# Patient Record
Sex: Female | Born: 1955 | Race: White | Hispanic: No | Marital: Married | Smoking: Never smoker
Health system: Southern US, Community
[De-identification: ages and names within clinical notes are randomized; demographics above are authoritative.]

## PROBLEM LIST (undated history)

## (undated) DIAGNOSIS — R51 Headache: Secondary | ICD-10-CM

## (undated) DIAGNOSIS — D649 Anemia, unspecified: Secondary | ICD-10-CM

## (undated) DIAGNOSIS — C801 Malignant (primary) neoplasm, unspecified: Secondary | ICD-10-CM

## (undated) DIAGNOSIS — T8859XA Other complications of anesthesia, initial encounter: Secondary | ICD-10-CM

## (undated) DIAGNOSIS — K8050 Calculus of bile duct without cholangitis or cholecystitis without obstruction: Secondary | ICD-10-CM

## (undated) DIAGNOSIS — R160 Hepatomegaly, not elsewhere classified: Secondary | ICD-10-CM

## (undated) DIAGNOSIS — K219 Gastro-esophageal reflux disease without esophagitis: Secondary | ICD-10-CM

## (undated) DIAGNOSIS — T4145XA Adverse effect of unspecified anesthetic, initial encounter: Secondary | ICD-10-CM

## (undated) DIAGNOSIS — M1990 Unspecified osteoarthritis, unspecified site: Secondary | ICD-10-CM

## (undated) DIAGNOSIS — N200 Calculus of kidney: Secondary | ICD-10-CM

## (undated) DIAGNOSIS — E669 Obesity, unspecified: Secondary | ICD-10-CM

## (undated) DIAGNOSIS — K8309 Other cholangitis: Secondary | ICD-10-CM

## (undated) DIAGNOSIS — G8929 Other chronic pain: Secondary | ICD-10-CM

## (undated) HISTORY — DX: Other cholangitis: K83.09

## (undated) HISTORY — DX: Unspecified osteoarthritis, unspecified site: M19.90

## (undated) HISTORY — DX: Hepatomegaly, not elsewhere classified: R16.0

## (undated) HISTORY — DX: Calculus of kidney: N20.0

## (undated) HISTORY — DX: Malignant (primary) neoplasm, unspecified: C80.1

## (undated) HISTORY — DX: Gastro-esophageal reflux disease without esophagitis: K21.9

## (undated) HISTORY — DX: Calculus of bile duct without cholangitis or cholecystitis without obstruction: K80.50

## (undated) HISTORY — DX: Other chronic pain: G89.29

## (undated) HISTORY — DX: Obesity, unspecified: E66.9

## (undated) HISTORY — DX: Anemia, unspecified: D64.9

## (undated) HISTORY — DX: Headache: R51

---

## 1975-03-14 HISTORY — PX: CHOLECYSTECTOMY OPEN: SUR202

## 2002-03-13 HISTORY — PX: KNEE ARTHROSCOPY: SUR90

## 2005-03-13 HISTORY — PX: ACOUSTIC NEUROMA RESECTION: SHX5713

## 2008-11-20 ENCOUNTER — Ambulatory Visit: Payer: Self-pay | Admitting: Internal Medicine

## 2008-11-24 LAB — COMPREHENSIVE METABOLIC PANEL
ALT: 23 U/L (ref 0–35)
AST: 22 U/L (ref 0–37)
Albumin: 4.4 g/dL (ref 3.5–5.2)
Alkaline Phosphatase: 92 U/L (ref 39–117)
BUN: 15 mg/dL (ref 6–23)
CO2: 26 mEq/L (ref 19–32)
Calcium: 9.6 mg/dL (ref 8.4–10.5)
Glucose, Bld: 94 mg/dL (ref 70–99)
Potassium: 4.1 mEq/L (ref 3.5–5.3)
Total Bilirubin: 1.1 mg/dL (ref 0.3–1.2)

## 2008-11-24 LAB — CBC WITH DIFFERENTIAL/PLATELET
BASO%: 0.5 % (ref 0.0–2.0)
Basophils Absolute: 0 10*3/uL (ref 0.0–0.1)
Eosinophils Absolute: 0.2 10*3/uL (ref 0.0–0.5)
LYMPH%: 41.7 % (ref 14.0–49.7)
MCHC: 33.7 g/dL (ref 31.5–36.0)
MCV: 87.7 fL (ref 79.5–101.0)
MONO#: 0.4 10*3/uL (ref 0.1–0.9)
MONO%: 10.7 % (ref 0.0–14.0)
NEUT#: 1.8 10*3/uL (ref 1.5–6.5)
RBC: 4.63 10*6/uL (ref 3.70–5.45)
WBC: 4.2 10*3/uL (ref 3.9–10.3)
lymph#: 1.7 10*3/uL (ref 0.9–3.3)

## 2008-12-03 ENCOUNTER — Encounter: Admission: RE | Admit: 2008-12-03 | Discharge: 2008-12-03 | Payer: Self-pay | Admitting: Family Medicine

## 2008-12-28 ENCOUNTER — Encounter: Admission: RE | Admit: 2008-12-28 | Discharge: 2008-12-28 | Payer: Self-pay | Admitting: Otolaryngology

## 2009-03-13 HISTORY — PX: ROUX-EN-Y GASTRIC BYPASS: SHX1104

## 2010-11-17 ENCOUNTER — Emergency Department (HOSPITAL_COMMUNITY): Payer: BC Managed Care – PPO

## 2010-11-17 ENCOUNTER — Inpatient Hospital Stay (HOSPITAL_COMMUNITY)
Admission: EM | Admit: 2010-11-17 | Discharge: 2010-11-21 | DRG: 416 | Disposition: A | Discharge status: Home / Self Care | Payer: BC Managed Care – PPO | Attending: Internal Medicine | Admitting: Internal Medicine

## 2010-11-17 ENCOUNTER — Encounter (HOSPITAL_COMMUNITY): Payer: Self-pay

## 2010-11-17 ENCOUNTER — Emergency Department (HOSPITAL_COMMUNITY)
Admit: 2010-11-17 | Discharge: 2010-11-17 | Disposition: A | Discharge status: Home / Self Care | Payer: BC Managed Care – PPO | Attending: Gastroenterology | Admitting: Gastroenterology

## 2010-11-17 DIAGNOSIS — R109 Unspecified abdominal pain: Secondary | ICD-10-CM

## 2010-11-17 DIAGNOSIS — R509 Fever, unspecified: Secondary | ICD-10-CM | POA: Diagnosis present

## 2010-11-17 DIAGNOSIS — K8050 Calculus of bile duct without cholangitis or cholecystitis without obstruction: Secondary | ICD-10-CM | POA: Diagnosis present

## 2010-11-17 DIAGNOSIS — A409 Streptococcal sepsis, unspecified: Principal | ICD-10-CM | POA: Diagnosis present

## 2010-11-17 DIAGNOSIS — I495 Sick sinus syndrome: Secondary | ICD-10-CM | POA: Diagnosis present

## 2010-11-17 DIAGNOSIS — A4151 Sepsis due to Escherichia coli [E. coli]: Secondary | ICD-10-CM | POA: Diagnosis present

## 2010-11-17 DIAGNOSIS — A419 Sepsis, unspecified organism: Secondary | ICD-10-CM | POA: Diagnosis present

## 2010-11-17 DIAGNOSIS — Z9884 Bariatric surgery status: Secondary | ICD-10-CM

## 2010-11-17 DIAGNOSIS — K831 Obstruction of bile duct: Secondary | ICD-10-CM

## 2010-11-17 DIAGNOSIS — K8309 Other cholangitis: Secondary | ICD-10-CM | POA: Diagnosis present

## 2010-11-17 LAB — COMPREHENSIVE METABOLIC PANEL
ALT: 162 U/L — ABNORMAL HIGH (ref 0–35)
Alkaline Phosphatase: 409 U/L — ABNORMAL HIGH (ref 39–117)
CO2: 24 mEq/L (ref 19–32)
Chloride: 99 mEq/L (ref 96–112)
Creatinine, Ser: 0.47 mg/dL — ABNORMAL LOW (ref 0.50–1.10)
GFR calc Af Amer: >60 mL/min (ref >60)
Glucose, Bld: 140 mg/dL — ABNORMAL HIGH (ref 70–99)
Potassium: 3.8 mEq/L (ref 3.5–5.1)

## 2010-11-17 LAB — APTT: aPTT: 32 seconds (ref 24–37)

## 2010-11-17 LAB — DIFFERENTIAL
Lymphocytes Relative: 2 % — ABNORMAL LOW (ref 12–46)
Monocytes Relative: 1 % — ABNORMAL LOW (ref 3–12)
Neutrophils Relative %: 97 % — ABNORMAL HIGH (ref 43–77)

## 2010-11-17 LAB — URINE MICROSCOPIC-ADD ON

## 2010-11-17 LAB — URINALYSIS, ROUTINE W REFLEX MICROSCOPIC
Nitrite: POSITIVE — AB
Protein, ur: NEGATIVE mg/dL

## 2010-11-17 LAB — PROTIME-INR: Prothrombin Time: 13.1 seconds (ref 11.6–15.2)

## 2010-11-17 LAB — CBC
HCT: 42.4 % (ref 36.0–46.0)
MCHC: 31.8 g/dL (ref 30.0–36.0)
MCV: 92.8 fL (ref 78.0–100.0)
WBC: 16.4 10*3/uL — ABNORMAL HIGH (ref 4.0–10.5)

## 2010-11-17 LAB — MRSA PCR SCREENING: MRSA by PCR: NEGATIVE

## 2010-11-17 LAB — ACETAMINOPHEN LEVEL: Acetaminophen (Tylenol), Serum: <15 ug/mL (ref 10–30)

## 2010-11-17 LAB — LACTIC ACID, PLASMA: Lactic Acid, Venous: 3.5 mmol/L — ABNORMAL HIGH (ref 0.5–2.2)

## 2010-11-17 LAB — PROCALCITONIN: Procalcitonin: 2.06 ng/mL

## 2010-11-17 LAB — LIPASE, BLOOD: Lipase: 11 U/L (ref 11–59)

## 2010-11-17 MED ORDER — IOHEXOL 300 MG/ML  SOLN
100.0000 mL | Freq: Once | INTRAMUSCULAR | Status: AC | PRN
  Administered 2010-11-17: 100 mL via INTRAVENOUS

## 2010-11-17 NOTE — H&P (Signed)
NAME:  Theresa Morrow, Theresa Morrow NO.:  [id number]  MEDICAL RECORD NO.:  [id number]  LOCATION:  WLED                         FACILITY:  [facility]  PHYSICIAN:  Brendia Sacks, MD    DATE OF BIRTH:  06/23/1955  DATE OF ADMISSION:  11/17/2010 DATE OF DISCHARGE:                             HISTORY & PHYSICAL   REFERRING PHYSICIAN:  Abbe Amsterdam in the emergency room.  PRIMARY CARE PHYSICIAN:  Marjory Lies, MD  CHIEF COMPLAINT:  Abdominal pain.  HISTORY OF PRESENT ILLNESS:  This is a 55 year old who presents to the emergency room with acute abdominal pain.  The patient reports that about a week ago she had flu-like symptoms and fever up to 103, this resolved.  Then yesterday afternoon, she developed abdominal pain, felt quite weak and hardly had the energy to drive home.  Last night, she had dry heaves, no diarrhea but has had persistent right upper quadrant pain in max intensity of 7/10.  She feels like she has been "kicked."  Pain is now better with pain medication.  REVIEW OF SYSTEMS:  Positive for fever, negative for changes to her vision, sore throat, rash, muscle aches, chest pain, dysuria or bleeding.  She had some shortness of breath secondary to pain but that is now better.  PAST MEDICAL HISTORY:  Chronic knee pain.  PAST SURGICAL HISTORY: 1. Left arthroscopic knee surgery. 2. Cholecystectomy about 25 years ago. 3. Gastric bypass about 2 years ago at [facility] in     [place]. 4. Acoustic neuroma resection.  ALLERGIES:  None.  SOCIAL HISTORY:  Nonsmoker, nondrinker.  She lives in [place] with her husband.  FAMILY HISTORY:  Her mother had heart disease and gallbladder problems have been noted in her family.  MEDICATIONS: 1. Percocet 10/325 p.o. q.i.d. as needed for pain.  She takes this as     an alternative to Vicodin as needed. 2. Cyanocobalamin p.o. daily. 3. Artificial tears each eye 1 drop t.i.d. 4. Excedrin Migraine every 8 hours as  needed. 5. Motrin 200 mg every 4 hours as needed. 6. Vicodin ES 7.5/750 one tablet every 4 hours as needed for pain.  PHYSICAL EXAMINATION:  VITAL SIGNS:  Temperature up to 103.5.  Initial blood pressure 158/94, most recent blood pressure recorded 127/70 with a pulse of 84, respirations of 22 and temperature 99.6.  Systolic blood pressure in the 90s during my examination. GENERAL:  Well-developed, well-nourished woman who appears to be acutely ill but is nontoxic in appearance. HEAD:  Appears to be normal. EYES:  Sclerae are clear.  Pupils are equal, round, reactive to light with irises and conjunctiva appearing unremarkable. EARS:  Hearing grossly intact. MOUTH:  Lips, gums and tongue appear unremarkable. NECK:  Supple.  No lymphadenopathy or masses.  No thyromegaly. CHEST:  Clear to auscultation bilaterally.  No wheezes, rales or rhonchi.  There is normal respiratory effort. CARDIOVASCULAR:  Regular rate and rhythm.  No murmurs, rubs or gallop. There is 2+ bilateral lower extremity edema which she reports as chronic. ABDOMEN:  Soft with positive right upper quadrant pain.  It is nondistended. SKIN:  Normal without rash or indurations.  It is nontender  to palpation. MUSCULOSKELETAL:  Appears to be grossly normal. PSYCHIATRIC:  Grossly normal mood and affect.  Speech is fluent and appropriate.  She is a good historian.  IMAGING:  CT of the abdomen and pelvis reveals dilated bile ducts with multiple tiny stones in the distal common bile duct.  Hepatomegaly is noted.  PERTINENT LABORATORY STUDIES: 1. Venous lactic acid is 3.5. 2. Pro calcitonin is 2.06. 3. Lipase within normal limits. 4. CBC notable for white blood cell count 16.4.  Basic metabolic panel     unremarkable.  Hepatic function panel notable for a total bilirubin     of 5.1, alkaline phosphatase of 409, AST of 162 and ALT of 162.     Urinalysis equivocal.  Urine culture pending.  ASSESSMENT AND PLAN:  This is a  55 year old woman who presents with abdominal pain, nausea, vomiting and fever. 1. Cholangitis, choledolithiasis with sepsis.  The patient was     initially hypertensive and is now hypotensive.  She is nontoxic in     appearance, but she will need aggressive volume resuscitation and     antibiotics.  She has already received Zosyn and will continue this     at this time.  Gastroenterology has seen her in consultation     already in the emergency room.  She is not felt to be a candidate     for endoscopic retrograde cholangiopancreatography secondary to her     anatomy.  They recommended General Surgery consult which I have     placed at this time.  Interventional Radiology also already     consulted and has evaluated the patient and plans of placing a     drain in the next 30 minutes. 2. Chronic knee pain.  Morphine as needed.  Initially, the patient's     blood pressure was stable, but now she has become hypotensive and     her lactic acid is elevated concern for developing sepsis and we     will upgrade her status.  Discussed plan with her husband at     bedside.     Brendia Sacks, MD     DG/MEDQ  D:  11/17/2010  T:  11/17/2010  Job:  [id number]  cc:   Marjory Lies, M.D.  Electronically Signed by Brendia Sacks  on 11/17/2010 10:28:59 PM

## 2010-11-18 LAB — URINE CULTURE
Colony Count: NO GROWTH
Culture  Setup Time: 201209061148
Culture: NO GROWTH

## 2010-11-18 LAB — DIFFERENTIAL
Basophils Relative: 0 % (ref 0–1)
Eosinophils Absolute: 0 10*3/uL (ref 0.0–0.7)
Lymphocytes Relative: 6 % — ABNORMAL LOW (ref 12–46)
Lymphs Abs: 1.1 10*3/uL (ref 0.7–4.0)
Monocytes Absolute: 1.3 10*3/uL — ABNORMAL HIGH (ref 0.1–1.0)
Monocytes Relative: 7 % (ref 3–12)
Neutro Abs: 14.5 10*3/uL — ABNORMAL HIGH (ref 1.7–7.7)
Neutrophils Relative %: 86 % — ABNORMAL HIGH (ref 43–77)

## 2010-11-18 LAB — COMPREHENSIVE METABOLIC PANEL
Albumin: 2.4 g/dL — ABNORMAL LOW (ref 3.5–5.2)
Alkaline Phosphatase: 239 U/L — ABNORMAL HIGH (ref 39–117)
BUN: 17 mg/dL (ref 6–23)
Calcium: 8.3 mg/dL — ABNORMAL LOW (ref 8.4–10.5)
Creatinine, Ser: 0.62 mg/dL (ref 0.50–1.10)
Potassium: 3.8 mEq/L (ref 3.5–5.1)
Total Protein: 5.6 g/dL — ABNORMAL LOW (ref 6.0–8.3)

## 2010-11-18 LAB — HEPATITIS PANEL, ACUTE
HCV Ab: NEGATIVE
Hep A IgM: NEGATIVE

## 2010-11-18 LAB — CBC
HCT: 35.2 % — ABNORMAL LOW (ref 36.0–46.0)
MCH: 29.4 pg (ref 26.0–34.0)
MCV: 93.1 fL (ref 78.0–100.0)
WBC: 16.8 10*3/uL — ABNORMAL HIGH (ref 4.0–10.5)

## 2010-11-18 LAB — LIPASE, BLOOD: Lipase: 7 U/L — ABNORMAL LOW (ref 11–59)

## 2010-11-19 LAB — DIFFERENTIAL
Basophils Absolute: 0 10*3/uL (ref 0.0–0.1)
Basophils Relative: 0 % (ref 0–1)
Eosinophils Relative: 1 % (ref 0–5)
Monocytes Absolute: 0.4 10*3/uL (ref 0.1–1.0)
Monocytes Relative: 6 % (ref 3–12)

## 2010-11-19 LAB — COMPREHENSIVE METABOLIC PANEL
Albumin: 2.3 g/dL — ABNORMAL LOW (ref 3.5–5.2)
Alkaline Phosphatase: 198 U/L — ABNORMAL HIGH (ref 39–117)
BUN: 13 mg/dL (ref 6–23)
Calcium: 8.8 mg/dL (ref 8.4–10.5)
Chloride: 110 mEq/L (ref 96–112)
GFR calc Af Amer: >60 mL/min (ref >60)
Glucose, Bld: 83 mg/dL (ref 70–99)
Potassium: 3.5 mEq/L (ref 3.5–5.1)
Total Bilirubin: 1.3 mg/dL — ABNORMAL HIGH (ref 0.3–1.2)
Total Protein: 5.3 g/dL — ABNORMAL LOW (ref 6.0–8.3)

## 2010-11-19 LAB — CBC
HCT: 33.9 % — ABNORMAL LOW (ref 36.0–46.0)
MCH: 29.6 pg (ref 26.0–34.0)
MCHC: 31.9 g/dL (ref 30.0–36.0)
MCV: 92.9 fL (ref 78.0–100.0)
RDW: 14.7 % (ref 11.5–15.5)

## 2010-11-20 LAB — COMPREHENSIVE METABOLIC PANEL
Albumin: 2.1 g/dL — ABNORMAL LOW (ref 3.5–5.2)
Alkaline Phosphatase: 164 U/L — ABNORMAL HIGH (ref 39–117)
BUN: 8 mg/dL (ref 6–23)
CO2: 24 mEq/L (ref 19–32)
Chloride: 113 mEq/L — ABNORMAL HIGH (ref 96–112)
Creatinine, Ser: 0.67 mg/dL (ref 0.50–1.10)
GFR calc Af Amer: >60 mL/min (ref >60)
GFR calc non Af Amer: >60 mL/min (ref >60)
Glucose, Bld: 88 mg/dL (ref 70–99)
Potassium: 3.7 mEq/L (ref 3.5–5.1)
Total Bilirubin: 1 mg/dL (ref 0.3–1.2)

## 2010-11-20 LAB — CBC
HCT: 31.8 % — ABNORMAL LOW (ref 36.0–46.0)
Hemoglobin: 10.3 g/dL — ABNORMAL LOW (ref 12.0–15.0)
MCV: 92.7 fL (ref 78.0–100.0)
RBC: 3.43 MIL/uL — ABNORMAL LOW (ref 3.87–5.11)
RDW: 14.5 % (ref 11.5–15.5)
WBC: 2.9 10*3/uL — ABNORMAL LOW (ref 4.0–10.5)

## 2010-11-21 LAB — COMPREHENSIVE METABOLIC PANEL
ALT: 34 U/L (ref 0–35)
Albumin: 2.1 g/dL — ABNORMAL LOW (ref 3.5–5.2)
Alkaline Phosphatase: 150 U/L — ABNORMAL HIGH (ref 39–117)
Calcium: 8.2 mg/dL — ABNORMAL LOW (ref 8.4–10.5)
GFR calc Af Amer: >60 mL/min (ref >60)
Glucose, Bld: 85 mg/dL (ref 70–99)
Potassium: 3.4 mEq/L — ABNORMAL LOW (ref 3.5–5.1)
Sodium: 143 mEq/L (ref 135–145)
Total Protein: 4.8 g/dL — ABNORMAL LOW (ref 6.0–8.3)

## 2010-11-22 LAB — BODY FLUID CULTURE: Gram Stain: NONE SEEN

## 2010-11-22 NOTE — Discharge Summary (Signed)
NAMEMarland Kitchen  Theresa Morrow, Theresa Morrow NO.:  [id number]  MEDICAL RECORD NO.:  [id number]  LOCATION:  1505                         FACILITY:  [facility]  PHYSICIAN:  Brendia Sacks, MD    DATE OF BIRTH:  1955-07-10  DATE OF ADMISSION:  11/17/2010 DATE OF DISCHARGE:  11/21/2010                              DISCHARGE SUMMARY   PRIMARY CARE PHYSICIAN:  Marjory Lies, M.D.  PRIMARY SURGEON:  Abigail Miyamoto, M.D.  CONDITION ON DISCHARGE:  Improved.  DISPOSITION:  Home.  DISCHARGE DIAGNOSES: 1. Sepsis secondary to cholangitis and choledocholithiasis. 2. Chronic asymptomatic sinus bradycardia.  HISTORY OF PRESENT ILLNESS:  This is a 55 year old woman who presented to the emergency room with acute abdominal pain and was found to have cholangitis, sepsis, and choledocholithiasis.  HOSPITAL COURSE:  Theresa Morrow was admitted initially to the ICU with sepsis.  Her condition rapidly improved, status post percutaneous drain placement and with antibiotic therapy.  She remains afebrile and hemodynamically stable.  She has no nausea, vomiting, and no pain.  She has been followed both by General Surgery and Interventional Radiology. At this point, her drain has been capped and she is felt to be stable for discharge.  The recommendation was to leave the drain capped and a follow up with Interventional Radiology in 2 weeks for repeat cholangiogram.  She also follow up with Dr. Magnus Ivan in approximately 2 weeks.  At that time, consideration to ursodiol could be given.  Her culture reports are available today.  I discussed with Dr. Daiva Eves who recommends therapy with Bactrim double strength and Augmentin.  Given her clinical improvement, he feels that oral therapy is reasonable in this patient and recommends a total 14 day of therapy.  CONSULTATIONS: 1. Gastroenterology, they recommended surgical evaluation,     interventional evaluation given, the patient's Roux-en-Y she was     not a  candidate for ERCP. 2. General Surgery.  At this point, they recommend no surgery, but     follow her up in the clinic in approximately 2 weeks and will give     thought to ursodiol in the outpatient setting. 3. Interventional Radiology for percutaneous drain placement.  PROCEDURES:  Cholangiogram and placement of a biliary drain.  IMAGING: 1. CT of the abdomen and pelvis, September 6th:  Dilated bile ducts     with what appear to be multiple tiny stones in the distal common     bile duct.  Hepatomegaly.  MICROBIOLOGY: 1. Urine cultures, September 6th, no growth, final. 2. Blood cultures x2, no growth to date from September 6th. 3. Bile fluid culture, September 6th:  Rare enterococcus, sensitive to     vancomycin, ampicillin, and gentamicin, and rare Escherichia coli,     sensitive to Bactrim among other antibiotics.  PERTINENT LABORATORY STUDIES: 1. White blood cell count was 16.4 on admission and 2.99 discharge. 2. Basic metabolic panel unremarkable. 3. Total bilirubin 5.1 on admission, 0.9 on discharge.  Alkaline     phosphatase 409 on admission, 150 on discharge.  AST 162 on     admission and 12 on discharge.  ALT 162 on admission and 34 on     discharge.  4. Lactic acid was known to be 3.5 on admission. 5. Hepatitis panel was negative.  DISCHARGE EXAMINATION:  GENERAL:  The patient feels well.  She is afebrile.  She has no complaints. VITAL SIGNS:  Temperature is 97.6, pulse 67, respirations 18, blood pressure 140/78, saturating 100% room air. CARDIOVASCULAR:  Regular rate and rhythm.  No murmur, rub, or gallop. RESPIRATORY:  Clear to auscultation bilaterally.  No wheezes, rales, or rhonchi.  Normal respiratory effort.  DISCHARGE INSTRUCTIONS:  The patient will be discharged home today.  DIET:  Unrestricted.  ACTIVITIES:  Unrestricted.  FOLLOWUP:  With Dr. Magnus Ivan in 2 weeks but after cholangiogram.  Follow up with Dr. Lowella Dandy, Interventional Radiology in 2 weeks' time  for a repeat cholangiogram.  Follow up with Dr. Marjory Lies in approximately 1 month.  DISCHARGE MEDICATIONS: 1. Augmentin 875 mg p.o. b.i.d. 2. Bactrim DS 1 tablet p.o. b.i.d.  Resume the following medications: 1. Topamax 25 mg p.o. q.h.s. 2. Artificial tears t.i.d., each eye 1 drop. 3. Cyanocobalamin 1 by mouth daily. 4. Excedrin Migraine every 8 hours as needed for headache. 5. Motrin 200 mg every 4 hours as needed for pain. 6. Percocet 10/325 p.o. q.i.d. as needed.  Note, the patient     alternates this with Vicodin 7.5/750 every 4 hours as needed for     pain.  Things follow up in the outpatient setting, see body of dictation above.  Time coordinating discharge is 35 minutes.     Brendia Sacks, MD     DG/MEDQ  D:  11/21/2010  T:  11/22/2010  Job:  [id number]  cc:   Marjory Lies, M.D. Fax: [phone]  Abigail Miyamoto, M.D. [address] [place] [address]  Electronically Signed by Brendia Sacks  on 11/22/2010 09:36:51 PM

## 2010-11-23 LAB — CULTURE, BLOOD (ROUTINE X 2)
Culture  Setup Time: 201209061519
Culture: NO GROWTH

## 2010-11-24 ENCOUNTER — Other Ambulatory Visit (HOSPITAL_COMMUNITY): Payer: Self-pay | Admitting: Gastroenterology

## 2010-11-24 DIAGNOSIS — K8309 Other cholangitis: Secondary | ICD-10-CM

## 2010-12-04 NOTE — Consult Note (Signed)
  NAMELAKEESHA, FONTANILLA NO.:  [id number]  MEDICAL RECORD NO.:  [id number]  LOCATION:  WLED                         FACILITY:  [facility]  PHYSICIAN:  Analy Bassford C. Madilyn Fireman, M.D.    DATE OF BIRTH:  [birth year]  DATE OF CONSULTATION: DATE OF DISCHARGE:                                CONSULTATION   REASON FOR CONSULTATION:  Cholangitis.  HISTORY OF ILLNESS:  The patient is a 55 year old white female, who presents with a 1-2 day history of diffuse epigastric abdominal pain, nausea, malaise and chills with one episode of dry heaves.  She also noted some light color to her stool 2 weeks ago, but has not noticed since until yesterday when her pain began.  She came to the emergency room where she was found to have a temperature of 103.5, WBC count 16,500, bilirubin of 5.0.  CT of the abdomen shows surgically absent gallbladder and prior evidence of gastric bypass and dilated intra and extrahepatic bile ducts with several small distal common bile duct stones.  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  Other than obesity, she denies any significant other past medical history.  PAST MEDICAL HISTORY:  Obesity.  PAST SURGICAL HISTORY: 1. Cholecystectomy, 25 years ago. 2. Roux-en-Y gastric bypass, 2 years ago. 3. Arthroscopic knee surgery, 10 years ago. 4. Acoustic neuroma removed in the past.  MEDICATIONS:  Vicodin p.r.n. for pain.  ALLERGIES:  None known.  FAMILY HISTORY:  Father died of bladder cancer.  Mother died of coronary artery disease and diabetes.  SOCIAL HISTORY:  The patient denies alcohol or tobacco use.  She is married with three children.  She works at [employer] in [place].  PHYSICAL EXAM:  GENERAL:  Well-developed, well-nourished white female, in mild distress. VITAL SIGNS:  Pulse 78, blood pressure 158/94, temperature 103.5. HEENT:  There is mild scleral icterus. HEART:  Regular rate and rhythm without  murmur. ABDOMEN:  Soft, nondistended with normoactive bowel sounds.  There is mild diffuse upper abdominal tenderness without guarding or rebound. There is a well-healed oblique cholecystectomy scar in the right upper quadrant. EXTREMITIES:  Without cyanosis, clubbing or edema.  LABS:  WBC 16,000, bilirubin 5.1, alkaline phosphatase 409, AST 162, ALT 162, lactic acid 3.5.  Serum lipase normal.  CT scan of the abdomen shows surgically absent gallbladder, evidence of prior gastric bypass, dilated intrahepatic and extrahepatic bile ducts with several small common bile duct stones.  IMPRESSION: 1. Retained common bile duct stones with cholangitis. 2. History of prior Roux-en-Y gastric bypass precluding endoscopic     retrograde cholangiopancreatography.  PLAN: 1. Admit. 2. IV antibiotics. 3. Consult Interventional Radiology for initial and possibly     definitive drainage and removal of stones.  May also need a     surgical consult if Interventional Radiology interventions are not     definitive as ERCP precluded by her anatomy.______________________________ Everardo All Madilyn Fireman, M.D.     JCH/MEDQ  D:  11/17/2010  T:  11/17/2010  Job:  [id number]  Electronically Signed by Dorena Cookey M.D. on 12/04/2010 11:40:40 AM

## 2010-12-09 ENCOUNTER — Inpatient Hospital Stay (HOSPITAL_COMMUNITY): Admission: RE | Admit: 2010-12-09 | Payer: BC Managed Care – PPO | Source: Ambulatory Visit

## 2010-12-09 ENCOUNTER — Ambulatory Visit (HOSPITAL_COMMUNITY)
Admission: RE | Admit: 2010-12-09 | Discharge: 2010-12-09 | Disposition: A | Discharge status: Home / Self Care | Payer: BC Managed Care – PPO | Source: Ambulatory Visit | Attending: Gastroenterology | Admitting: Gastroenterology

## 2010-12-09 DIAGNOSIS — K838 Other specified diseases of biliary tract: Secondary | ICD-10-CM | POA: Insufficient documentation

## 2010-12-09 DIAGNOSIS — K8309 Other cholangitis: Secondary | ICD-10-CM | POA: Insufficient documentation

## 2010-12-09 MED ORDER — IOHEXOL 300 MG/ML  SOLN
50.0000 mL | Freq: Once | INTRAMUSCULAR | Status: AC | PRN
  Administered 2010-12-09: 30 mL

## 2010-12-12 NOTE — Consult Note (Signed)
NAMEMarland Kitchen  TERREA, BRUSTER NO.:  [id number]  MEDICAL RECORD NO.:  [id number]  LOCATION:  1239                         FACILITY:  [facility]  PHYSICIAN:  Abigail Miyamoto, M.D. DATE OF BIRTH:  January 16, 1956  DATE OF CONSULTATION: DATE OF DISCHARGE:                                CONSULTATION   REQUESTING PHYSICIAN:  Brendia Sacks, MD  REASON FOR CONSULTATION:  Abdominal pain.  HISTORY OF PRESENT ILLNESS:  Theresa Morrow is a pleasant 55 year old female who was in her usual state of health, although she has not quite well over the past 7-10 days noting some intermittent fever and chills. However, over the past 2 days, she noticed significant increased abdominal pain with some associated higher fever, nausea, vomiting, and loss of appetite.  That is what has brought her to the emergency department today.  She has an underlying history of an open cholecystectomy about 30 years ago and a Roux-en-Y bypass about 2 years ago in [place], [place].  She has lost approximately 170 pounds since that procedure and has otherwise been doing well.  Her presentation to the emergency department prompted a significant workup, including labs and a CT scan.  Her labs are significantly abnormal with elevated liver enzymes and a CT scan is now found, retained common duct stones with an intra and extrahepatic biliary ductal dilatation.  The gastroenterologist had already been consulted, but due to her surgical anatomy if this precludes, straightforward ERCP for possible stone extraction.  Therefore, the hospitalist service has admitted the patient and has consulted Interventional Radiology, but also has asked for surgical consult in the event that minimally invasive intervention is unsuccessful.  PAST MEDICAL HISTORY:  Obesity.  PAST SURGICAL HISTORY:  Open cholecystectomy 30 years ago; Roux-en-Y bypass 2 years ago in [place], [place]; arthroscopy on her knee; and had a resection  of an acoustic neuroma.  FAMILY HISTORY:  Noncontributory to present case.  SOCIAL HISTORY:  The patient is married.  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  She lives at home.  She denies alcohol, tobacco, or illicit drug use.  MEDICATIONS:  The patient takes an as needed pain medications, including hydrocodone and Motrin.  ALLERGIES:  No known drug or latex allergies.  REVIEW OF SYSTEMS:  Please see history of illness for pertinent findings.  PHYSICAL EXAMINATION:  GENERAL:  A 55 year old female who is in mild distress, but nontoxic appearing. VITAL SIGNS:  Currently, temperature of 99.6 and maximum of 103.5, heart rate of 84, blood pressure 127/70, respiratory rate of 22, oxygen saturation anywhere from 93% to 100% on room air. ENT:  Unremarkable. NECK:  Supple without lymphadenopathy.  Trachea is midline.  No thyromegaly or masses. LUNGS:  Clear to auscultation.  No wheezes, rhonchi, or rales. HEART:  Regular rate and rhythm.  No murmurs, gallops, or rubs. Carotids 2+ and brisk without bruits.  Peripheral pulses intact and symmetrical. LUNGS:  Clear to auscultation.  No wheezes, rhonchi, or rales.  Normal respiratory effort without use of accessory muscles. ABDOMEN:  Soft.  Surgical scars as consistent with her history.  No mass effect or hernias are appreciated.  The patient is tender in the  epigastrium without evidence of peritonitis. RECTAL:  Deferred. SKIN:  Mild jaundice is appreciated.  Otherwise, no rashes, lesions, or nodules noted. NEUROLOGIC:  The patient is alert and oriented x3.  LABORATORY DATA:  CBC shows a white blood cell count of 16.4, hemoglobin of 13.5, hematocrit of 42.4, platelet count of 322.  Metabolic panel shows sodium of 137, potassium of 3.8, chloride of 99, CO2 of 24, BUN of 10, creatinine of 0.47, glucose of 140.  Elevated liver enzymes include a bilirubin of 5.1, alkaline phosphatase of 409, ALT of 162, AST of 162, lipase normal  at 11.  Coag panel normal.  DIAGNOSTICS:  CT scan shows dilated internal-external hepatic biliary duct with distal common bile duct stones.  No evidence of pneumobilia is appreciated.  IMPRESSION: 1. Choledocholithiasis. 2. Cholangitis.  PLAN:  Agree with admission for IV fluid and antibiotics support.  Agree with interventional radiology consultation for placement of an internal- external biliary drain/stent.  Hopefully, the patient will make symptomatic improvement with this percutaneous approach, however, I have discussed with the patient and her husband the possibility of surgical exploration should minimal invasive management be unsuccessful.  They understand that an open common duct exploration would be a significant surgery with increased risks and possible complications.  They understand this and agreed to try conservative management as already initiated.     Brayton El, PA-C   ______________________________ Abigail Miyamoto, M.D.    KB/MEDQ  D:  11/17/2010  T:  11/17/2010  Job:  [id number]  cc:   Brendia Sacks, MD  Electronically Signed by Brayton El  on 11/30/2010 03:22:10 PM Electronically Signed by Abigail Miyamoto M.D. on 12/12/2010 09:10:10 AM

## 2010-12-20 ENCOUNTER — Encounter (INDEPENDENT_AMBULATORY_CARE_PROVIDER_SITE_OTHER): Payer: BC Managed Care – PPO | Admitting: Surgery

## 2013-02-09 IMAGING — US IR PTC
1 series · 2 of 2 positions shown · non-contrast
Comparison: none

CLINICAL HISTORY: 54-year-old female with abdominal pain, biliary
dilatation and cholangitis.

[Series 1: ir ptc · 2 of 2 slices shown]
[im 1/2]
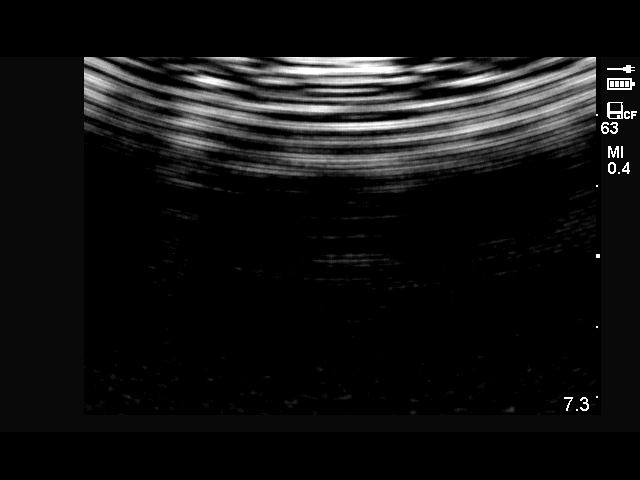
[im 2/2]
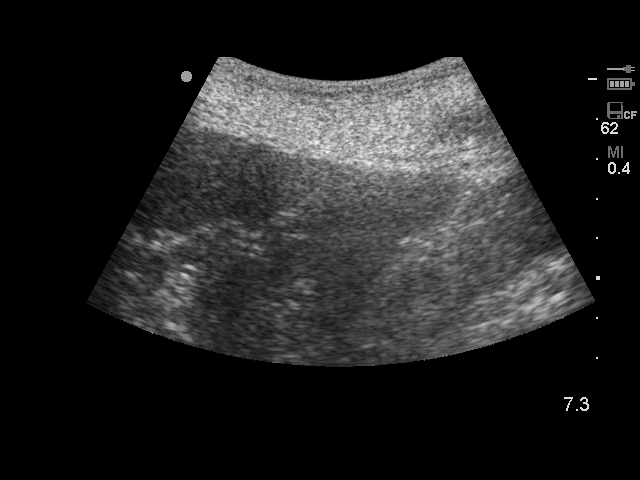

[2 of 2 positions shown; findings below may reference images not displayed]

PROCEDURE(S): ULTRASOUND AND FLUOROSCOPIC GUIDED PERCUTANEOUS
TRANSHEPATIC CHOLANGIOGRAM; PLACEMENT OF INTERNAL-EXTERNAL BILIARY
DRAIN

Medications:Versed 2 mg, Fentanyl 300 mcg.  Zosyn 3.375 mgIV.  A
radiology nurse monitored the patient for moderate sedation.

Moderate sedation time:60 minutes

Fluoroscopy time: 9.9 minutes

Procedure:The procedure was explained to the patient.  The risks
and benefits of the procedure were discussed and the patient's
questions were addressed.  Informed consent was obtained from the
patient.  The patient was placed supine on the interventional
table.  The anterior and right side of the abdomen were prepped and
draped in a sterile fashion.  Maximal barrier sterile technique was
utilized including caps, mask, sterile gowns, sterile gloves,
sterile drape, hand hygiene and skin antiseptic.  Ultrasound was
used to evaluate the liver.  Small biliary ducts in the lateral
left hepatic lobe were targeted.  A mid abdomen was anesthetized
with 1% lidocaine.  Needle was directed into the region of the bile
ducts but contrast was never able to opacify the biliary system.
Attention was directed to the right hepatic lobe.  The skin was
anesthetized with lidocaine.  22 and 21 gauge needles were directed
in the right hepatic lobe under fluoroscopy.  The needle was pulled
back while injecting contrast.  Right intrahepatic ducts were
eventually opacified and the common bile duct was opacified.  A
0.018 wire was advanced into the biliary system.  The tract was
dilated and a Bentson wire was placed.   A 5-French catheter was
advanced into the duodenum.  The tract was dilated with an 8-French
dilator.  An 8-French biliary drain was placed over a stiff Amplatz
wire and reconstituted within the duodenum.  A small sample of
bloody bile was sent for culture.  Catheter was sutured to the skin
and attached to a gravity bag.
FINDINGS: Mild dilatation of the intrahepatic biliary system.  The
percutaneous transhepatic cholangiogram demonstrated multiple small
filling defects in the common hepatic duct that were mobile.  These
filling defects appeared to pass through the common bile duct and
into the duodenum.  Contrast was spontaneously draining into the
duodenum.  A small peripheral duct in the right hepatic lobe was
cannulated and a 8-French drain was placed through this access.

Complications: None
IMPRESSION: Successful percutaneous transhepatic cholangiogram and
placement of an internal-external biliary drain.

Mild dilatation of the biliary system.  Small filling defects in
the common bile duct could represent small stones and/or gas
bubbles.  There was spontaneous drainage into the duodenum.

## 2013-03-05 ENCOUNTER — Encounter: Payer: Self-pay | Admitting: *Deleted

## 2013-03-05 ENCOUNTER — Encounter: Payer: Self-pay | Admitting: Internal Medicine

## 2013-04-25 ENCOUNTER — Other Ambulatory Visit: Payer: Self-pay | Admitting: Internal Medicine

## 2013-04-25 DIAGNOSIS — Z1231 Encounter for screening mammogram for malignant neoplasm of breast: Secondary | ICD-10-CM

## 2013-05-05 ENCOUNTER — Ambulatory Visit: Payer: No Typology Code available for payment source

## 2013-05-06 ENCOUNTER — Ambulatory Visit: Payer: BC Managed Care – PPO | Admitting: Internal Medicine

## 2013-07-11 ENCOUNTER — Ambulatory Visit (INDEPENDENT_AMBULATORY_CARE_PROVIDER_SITE_OTHER): Payer: No Typology Code available for payment source | Admitting: Internal Medicine

## 2013-07-11 ENCOUNTER — Encounter: Payer: Self-pay | Admitting: Internal Medicine

## 2013-07-11 VITALS — BP 128/80 | HR 68 | Ht 67.0 in | Wt 199.2 lb

## 2013-07-11 DIAGNOSIS — K219 Gastro-esophageal reflux disease without esophagitis: Secondary | ICD-10-CM

## 2013-07-11 MED ORDER — SUCRALFATE 1 G PO TABS: 1.0000 g | ORAL_TABLET | Freq: Two times a day (BID) | ORAL | Status: DC

## 2013-07-11 MED ORDER — PANTOPRAZOLE SODIUM 40 MG PO TBEC: 40.0000 mg | DELAYED_RELEASE_TABLET | Freq: Every evening | ORAL | Status: DC

## 2013-07-11 MED ORDER — RANITIDINE HCL 150 MG PO TABS: 150.0000 mg | ORAL_TABLET | ORAL | Status: DC

## 2013-07-11 NOTE — Progress Notes (Signed)
Theresa Morrow [birth year] [id number]  Note: This dictation was prepared with Dragon digital system. Any transcriptional errors that result from this procedure are unintentional.   History of Present Illness:   This is a 58 year old white female referred by Dr Nelva Bush for intense heartburn and acid regurgitation during the night  She also c/o  nocturnal cough of several months duration. She has tried over-the-counter TUMS and Nexium 20 mg  without  improvement. She denies dysphagia or odynophagia. She has chest pain which she attributes to   reflux. She underwent an Roux-en-Y gastrojejunostomy for obesity in 2011 and subsequently was hospitalized in September 2012 with choledocholithiasis, cholangitis and sepsis. Because of her gastrojejunal bypass, she was unable to undergo ERCP and instead had a percutaneous transhepatic cholangiogram with placement of internal and external stent and removal of common bile duct stones. She has not had any problems since then. She initially lost 150 pounds and has maintained her weight loss until recently when she started to gain. She eats very late at night because of her job often having only 2 hours between a late supper and going to bed. She takes Excedrin at least 2 a day, sometimes 4-6 a day. She underwent a colonoscopy 8 years ago in another facility  which apparently was a normal exam and she will be due for a repeat colonoscopy in 2 years. She had an open cholecystectomy at age 80 for cholelithiasis.    Past Medical History  Diagnosis Date  . Osteoarthritis   . Cholangitis   . Choledocholithiasis   . Hepatomegaly   . Anemia   . Chronic headaches   . GERD (gastroesophageal reflux disease)   . Kidney stones   . Obesity   . Bile duct stone     liver stones    Past Surgical History  Procedure Laterality Date  . Roux-en-y gastric bypass  2011    and removed liver stones  . Knee arthroscopy Left 2004  . Cholecystectomy open  1977  . Acoustic neuroma  resection  2007    No Known Allergies  Family history and social history have been reviewed.  Review of Systems:   The remainder of the 10 point ROS is negative except as outlined in the H&P  Physical Exam: General Appearance Well developed, in no distress Eyes  Non icteric  HEENT  Non traumatic, normocephalic  Mouth No lesion, tongue papillated, no cheilosis normal voice no cough Neck Supple without adenopathy, thyroid not enlarged, no carotid bruits, no JVD Lungs Clear to auscultation bilaterally COR Normal S1, normal S2, regular rhythm, no murmur, quiet precordium Abdomen soft status post open cholecystectomy and bypass surgery. Normal active bowel sounds. No tenderness Rectal soft Hemoccult positive stool Extremities  No pedal edema Skin No lesions Neurological Alert and oriented x 3 Psychological Normal mood and affect  Assessment and Plan:   Problem #1 Intense burning sensation in her throat and symptoms of gastroesophageal reflux during the day as well as at night. I suspect a combination of bile acid reflux as well as acid reflux. She has tried antacids and PPIs. We will start her on a stronger regimen of Protonix 40 mg at bedtime and ranitidine 150 mg in the morning and will also add Carafate 1 g by mouth twice a day for bile reflux. We have discussed  antireflux measures including modifying  her eating habits, eating much less at night. She may also  have an element of gastroparesis. She will be scheduled for an upper  endoscopy to rule out Barrett's esophagus and we will also check for H. Pylori.at that time.  Problem #2 Hemoccult-positive stool on today's exam. We will go ahead with the upper endoscopy. She is up-to-date on her colonoscopy but if there are no findings on upper endoscopy to explain her Hemoccult-positive stool, we will go ahead with colonoscopy . Patient agreed with the plan.    Lafayette Dragon 07/11/2013    '

## 2013-07-11 NOTE — Patient Instructions (Signed)
You have been scheduled for an endoscopy. Please follow written instructions given to you at your visit today. If you use inhalers (even only as needed), please bring them with you on the day of your procedure. Your physician has requested that you go to www.startemmi.com and enter the access code given to you at your visit today. This web site gives a general overview about your procedure. However, you should still follow specific instructions given to you by our office regarding your preparation for the procedure.  We have sent the following medications to your pharmacy for you to pick up at your convenience: Protonix (every night) Ranitidine (every morning) carafate (twice daily)  Gastroesophageal Reflux Disease, Adult Gastroesophageal reflux disease (GERD) happens when acid from your stomach flows up into the esophagus. When acid comes in contact with the esophagus, the acid causes soreness (inflammation) in the esophagus. Over time, GERD may create small holes (ulcers) in the lining of the esophagus. CAUSES   Increased body weight. This puts pressure on the stomach, making acid rise from the stomach into the esophagus.  Smoking. This increases acid production in the stomach.  Drinking alcohol. This causes decreased pressure in the lower esophageal sphincter (valve or ring of muscle between the esophagus and stomach), allowing acid from the stomach into the esophagus.  Late evening meals and a full stomach. This increases pressure and acid production in the stomach.  A malformed lower esophageal sphincter. Sometimes, no cause is found. SYMPTOMS   Burning pain in the lower part of the mid-chest behind the breastbone and in the mid-stomach area. This may occur twice a week or more often.  Trouble swallowing.  Sore throat.  Dry cough.  Asthma-like symptoms including chest tightness, shortness of breath, or wheezing. DIAGNOSIS  Your caregiver may be able to diagnose GERD based on  your symptoms. In some cases, X-rays and other tests may be done to check for complications or to check the condition of your stomach and esophagus. TREATMENT  Your caregiver may recommend over-the-counter or prescription medicines to help decrease acid production. Ask your caregiver before starting or adding any new medicines.  HOME CARE INSTRUCTIONS   Change the factors that you can control. Ask your caregiver for guidance concerning weight loss, quitting smoking, and alcohol consumption.  Avoid foods and drinks that make your symptoms worse, such as:  Caffeine or alcoholic drinks.  Chocolate.  Peppermint or mint flavorings.  Garlic and onions.  Spicy foods.  Citrus fruits, such as oranges, lemons, or limes.  Tomato-based foods such as sauce, chili, salsa, and pizza.  Fried and fatty foods.  Avoid lying down for the 3 hours prior to your bedtime or prior to taking a nap.  Eat small, frequent meals instead of large meals.  Wear loose-fitting clothing. Do not wear anything tight around your waist that causes pressure on your stomach.  Raise the head of your bed 6 to 8 inches with wood blocks to help you sleep. Extra pillows will not help.  Only take over-the-counter or prescription medicines for pain, discomfort, or fever as directed by your caregiver.  Do not take aspirin, ibuprofen, or other nonsteroidal anti-inflammatory drugs (NSAIDs). SEEK IMMEDIATE MEDICAL CARE IF:   You have pain in your arms, neck, jaw, teeth, or back.  Your pain increases or changes in intensity or duration.  You develop nausea, vomiting, or sweating (diaphoresis).  You develop shortness of breath, or you faint.  Your vomit is green, yellow, black, or looks like coffee grounds  or blood.  Your stool is red, bloody, or black. These symptoms could be signs of other problems, such as heart disease, gastric bleeding, or esophageal bleeding. MAKE SURE YOU:   Understand these  instructions.  Will watch your condition.  Will get help right away if you are not doing well or get worse. Document Released: 12/07/2004 Document Revised: 05/22/2011 Document Reviewed: 09/16/2010 [facility] Patient Information 2014 [place], [place].  Diet for Gastroesophageal Reflux Disease, Adult Reflux (acid reflux) is when acid from your stomach flows up into the esophagus. When acid comes in contact with the esophagus, the acid causes irritation and soreness (inflammation) in the esophagus. When reflux happens often or so severely that it causes damage to the esophagus, it is called gastroesophageal reflux disease (GERD). Nutrition therapy can help ease the discomfort of GERD. FOODS OR DRINKS TO AVOID OR LIMIT  Smoking or chewing tobacco. Nicotine is one of the most potent stimulants to acid production in the gastrointestinal tract.  Caffeinated and decaffeinated coffee and black tea.  Regular or low-calorie carbonated beverages or energy drinks (caffeine-free carbonated beverages are allowed).   Strong spices, such as black pepper, white pepper, red pepper, cayenne, curry powder, and chili powder.  Peppermint or spearmint.  Chocolate.  High-fat foods, including meats and fried foods. Extra added fats including oils, butter, salad dressings, and nuts. Limit these to less than 8 tsp per day.  Fruits and vegetables if they are not tolerated, such as citrus fruits or tomatoes.  Alcohol.  Any food that seems to aggravate your condition. If you have questions regarding your diet, call your caregiver or a registered dietitian. OTHER THINGS THAT MAY HELP GERD INCLUDE:   Eating your meals slowly, in a relaxed setting.  Eating 5 to 6 small meals per day instead of 3 large meals.  Eliminating food for a period of time if it causes distress.  Not lying down until 3 hours after eating a meal.  Keeping the head of your bed raised 6 to 9 inches (15 to 23 cm) by using a foam wedge or  blocks under the legs of the bed. Lying flat may make symptoms worse.  Being physically active. Weight loss may be helpful in reducing reflux in overweight or obese adults.  Wear loose fitting clothing EXAMPLE MEAL PLAN This meal plan is approximately 2,000 calories based on [email or web address] meal planning guidelines. Breakfast   cup cooked oatmeal.  1 cup strawberries.  1 cup low-fat milk.  1 oz almonds. Snack  1 cup cucumber slices.  6 oz yogurt (made from low-fat or fat-free milk). Lunch  2 slice whole-wheat bread.  2 oz sliced [place].  2 tsp mayonnaise.  1 cup blueberries.  1 cup snap peas. Snack  6 whole-wheat crackers.  1 oz string cheese. Dinner   cup brown rice.  1 cup mixed veggies.  1 tsp olive oil.  3 oz grilled fish. Document Released: 02/27/2005 Document Revised: 05/22/2011 Document Reviewed: 01/13/2011 [facility] Patient Information 2014 [place], [place].

## 2013-07-12 ENCOUNTER — Encounter: Payer: Self-pay | Admitting: Internal Medicine

## 2013-07-24 ENCOUNTER — Encounter: Payer: Self-pay | Admitting: Internal Medicine

## 2013-08-14 NOTE — H&P (Signed)
History of Present Illness:  This is a 58 year old white female referred by Dr Nelva Bush for intense heartburn and acid regurgitation during the night She also c/o nocturnal cough of several months duration. She has tried over-the-counter TUMS and Nexium 20 mg without improvement. She denies dysphagia or odynophagia. She has chest pain which she attributes to reflux. She underwent an Roux-en-Y gastrojejunostomy for obesity in 2011 and subsequently was hospitalized in September 2012 with choledocholithiasis, cholangitis and sepsis. Because of her gastrojejunal bypass, she was unable to undergo ERCP and instead had a percutaneous transhepatic cholangiogram with placement of internal and external stent and removal of common bile duct stones. She has not had any problems since then. She initially lost 150 pounds and has maintained her weight loss until recently when she started to gain. She eats very late at night because of her job often having only 2 hours between a late supper and going to bed. She takes Excedrin at least 2 a day, sometimes 4-6 a day. She underwent a colonoscopy 8 years ago in another facility which apparently was a normal exam and she will be due for a repeat colonoscopy in 2 years. She had an open cholecystectomy at age 26 for cholelithiasis.  Past Medical History   Diagnosis  Date   .  Osteoarthritis    .  Cholangitis    .  Choledocholithiasis    .  Hepatomegaly    .  Anemia    .  Chronic headaches    .  GERD (gastroesophageal reflux disease)    .  Kidney stones    .  Obesity    .  Bile duct stone      liver stones    Past Surgical History   Procedure  Laterality  Date   .  Roux-en-y gastric bypass   2011     and removed liver stones   .  Knee arthroscopy  Left  2004   .  Cholecystectomy open   1977   .  Acoustic neuroma resection   2007   No Known Allergies  Family history and social history have been reviewed.  Review of Systems:  The remainder of the 10 point ROS is  negative except as outlined in the H&P  Physical Exam:  General Appearance Well developed, in no distress  Eyes Non icteric  HEENT Non traumatic, normocephalic  Mouth No lesion, tongue papillated, no cheilosis normal voice no cough  Neck Supple without adenopathy, thyroid not enlarged, no carotid bruits, no JVD  Lungs Clear to auscultation bilaterally  COR Normal S1, normal S2, regular rhythm, no murmur, quiet precordium  Abdomen soft status post open cholecystectomy and bypass surgery. Normal active bowel sounds. No tenderness  Rectal soft Hemoccult positive stool  Extremities No pedal edema  Skin No lesions  Neurological Alert and oriented x 3  Psychological Normal mood and affect  Assessment and Plan:  Problem #1 Intense burning sensation in her throat and symptoms of gastroesophageal reflux during the day as well as at night. I suspect a combination of bile acid reflux as well as acid reflux. She has tried antacids and PPIs. We will start her on a stronger regimen of Protonix 40 mg at bedtime and ranitidine 150 mg in the morning and will also add Carafate 1 g by mouth twice a day for bile reflux. We have discussed antireflux measures including modifying her eating habits, eating much less at night. She may also have an element of gastroparesis. She will  be scheduled for an upper endoscopy to rule out Barrett's esophagus and we will also check for H. Pylori.at that time.  Problem #2 Hemoccult-positive stool on today's exam. We will go ahead with the upper endoscopy. She is up-to-date on her colonoscopy but if there are no findings on upper endoscopy to explain her Hemoccult-positive stool, we will go ahead with colonoscopy . Patient agreed with the plan.  Lafayette Dragon

## 2013-08-15 ENCOUNTER — Encounter (HOSPITAL_COMMUNITY): Payer: Self-pay | Admitting: *Deleted

## 2013-08-15 ENCOUNTER — Ambulatory Visit (HOSPITAL_COMMUNITY)
Admission: RE | Admit: 2013-08-15 | Discharge: 2013-08-15 | Disposition: A | Discharge status: Home / Self Care | Payer: No Typology Code available for payment source | Source: Ambulatory Visit | Attending: Internal Medicine | Admitting: Internal Medicine

## 2013-08-15 ENCOUNTER — Telehealth: Payer: Self-pay | Admitting: *Deleted

## 2013-08-15 ENCOUNTER — Encounter: Payer: Self-pay | Admitting: *Deleted

## 2013-08-15 ENCOUNTER — Encounter (HOSPITAL_COMMUNITY)
Admission: RE | Disposition: A | Discharge status: Home / Self Care | Payer: Self-pay | Source: Ambulatory Visit | Attending: Internal Medicine

## 2013-08-15 ENCOUNTER — Other Ambulatory Visit: Payer: Self-pay | Admitting: *Deleted

## 2013-08-15 DIAGNOSIS — Z79899 Other long term (current) drug therapy: Secondary | ICD-10-CM | POA: Insufficient documentation

## 2013-08-15 DIAGNOSIS — Z9884 Bariatric surgery status: Secondary | ICD-10-CM | POA: Insufficient documentation

## 2013-08-15 DIAGNOSIS — K2100 Gastro-esophageal reflux disease with esophagitis, without bleeding: Secondary | ICD-10-CM

## 2013-08-15 DIAGNOSIS — K219 Gastro-esophageal reflux disease without esophagitis: Secondary | ICD-10-CM

## 2013-08-15 DIAGNOSIS — K2990 Gastroduodenitis, unspecified, without bleeding: Secondary | ICD-10-CM

## 2013-08-15 DIAGNOSIS — K2970 Gastritis, unspecified, without bleeding: Secondary | ICD-10-CM | POA: Insufficient documentation

## 2013-08-15 HISTORY — DX: Other complications of anesthesia, initial encounter: T88.59XA

## 2013-08-15 HISTORY — PX: ESOPHAGOGASTRODUODENOSCOPY: SHX5428

## 2013-08-15 HISTORY — DX: Adverse effect of unspecified anesthetic, initial encounter: T41.45XA

## 2013-08-15 SURGERY — EGD (ESOPHAGOGASTRODUODENOSCOPY)
Anesthesia: Moderate Sedation

## 2013-08-15 MED ORDER — MIDAZOLAM HCL 10 MG/2ML IJ SOLN
INTRAMUSCULAR | Status: DC | PRN
  Administered 2013-08-15: 2 mg via INTRAVENOUS
  Administered 2013-08-15: 1 mg via INTRAVENOUS

## 2013-08-15 MED ORDER — METOCLOPRAMIDE HCL 5 MG PO TABS: 5.0000 mg | ORAL_TABLET | Freq: Three times a day (TID) | ORAL | Status: DC

## 2013-08-15 MED ORDER — FENTANYL CITRATE 0.05 MG/ML IJ SOLN
INTRAMUSCULAR | Status: AC
  Filled 2013-08-15: qty 2

## 2013-08-15 MED ORDER — BUTAMBEN-TETRACAINE-BENZOCAINE 2-2-14 % EX AERO
INHALATION_SPRAY | CUTANEOUS | Status: DC | PRN
  Administered 2013-08-15: 2 via TOPICAL

## 2013-08-15 MED ORDER — SODIUM CHLORIDE 0.9 % IV SOLN: INTRAVENOUS | Status: DC

## 2013-08-15 MED ORDER — FENTANYL CITRATE 0.05 MG/ML IJ SOLN
INTRAMUSCULAR | Status: DC | PRN
  Administered 2013-08-15: 25 ug via INTRAVENOUS

## 2013-08-15 MED ORDER — MIDAZOLAM HCL 10 MG/2ML IJ SOLN
INTRAMUSCULAR | Status: AC
  Filled 2013-08-15: qty 2

## 2013-08-15 NOTE — Interval H&P Note (Signed)
History and Physical Interval Note:  08/15/2013 10:36 AM  Theresa Morrow  has presented today for surgery, with the diagnosis of gerd  The various methods of treatment have been discussed with the patient and family. After consideration of risks, benefits and other options for treatment, the patient has consented to  Procedure(s): ESOPHAGOGASTRODUODENOSCOPY (EGD) (N/A) as a surgical intervention .  The patient's history has been reviewed, patient examined, no change in status, stable for surgery.  I have reviewed the patient's chart and labs.  Questions were answered to the patient's satisfaction.     Lafayette Dragon

## 2013-08-15 NOTE — Op Note (Signed)
[facility] [place], [address]   ENDOSCOPY PROCEDURE REPORT  PATIENT: Theresa Morrow, Theresa Morrow  MR#: [id number] BIRTHDATE: 1955/07/01 , 57  yrs. old GENDER: Female ENDOSCOPIST: Lafayette Dragon, MD REFERRED BY:  Suella Broad, M.D. , Dr Juanita Craver PROCEDURE DATE:  08/15/2013 PROCEDURE:  EGD w/ biopsy ASA CLASS:     Class II INDICATIONS:  gastroesophageal reflux refractory to PPIs and antireflux measures.  Patient had a Roux-en-Y gastrojejunostomy for obesity in  2011, she is complaining of cough.. MEDICATIONS: These medications were titrated to patient response per physician's verbal order, Fentanyl 25 mcg IV, and Versed 3 mg IV TOPICAL ANESTHETIC: Cetacaine Spray  DESCRIPTION OF PROCEDURE: After the risks benefits and alternatives of the procedure were thoroughly explained, informed consent was obtained.  The    endoscope was introduced through the mouth and advanced to the second portion of the duodenum. Without limitations.  The instrument was slowly withdrawn as the mucosa was fully examined.      Esophagus, proximal and mid esophageal mucosa was normal. There was mild erythema at the GE junction which was consistent with grade 1 esophagitis. Biopsies were obtained. There was no stricture or hiatal hernia. Squamocolumnar junction was located at 35 cm from the incisors Stomach: Gastric remnant measured 5 cm extending from 35-40 cm from the incisors. Gastrojejunostomy anastomosis was a slightly narrowed but admitted the endoscope without resistance. There was gastritis within the anastomosis. The mucosa appeared slightly friable within the anastomosis but there was no ulcer. Gastric mucosa in the fundus and body of the stomach was normal. Retroflexion of the endoscope revealing normal fundus and cardia Jejunum: efferent limb of the jejunostomy appeared normal and was examined over the length of at least 15-20 cm. There was no evidence of fluid  retention or food retention[         The scope was then withdrawn from the patient and the procedure completed.  COMPLICATIONS: There were no complications. ENDOSCOPIC IMPRESSION:  functioning Roux-en-Y gastrojejunostomy with 5 cm gastric remnant and patent anastomosis Grade 1 reflux esophagitis,status post biopsies Minimal gastritis at the anastomosis  RECOMMENDATIONS: 1.  Await pathology results 2.  Anti-reflux regimen to be follow 3.  add Reglan 5 mg 3 times a day before meals Small meals Gastric emptying scan Continue Carafate and PPI  REPEAT EXAM: for EGD pending biopsy results.  eSigned:  Lafayette Dragon, MD 08/15/2013 11:37 AM   CC:  PATIENT NAME:  Theresa Morrow, Theresa Morrow MR#: [id number]

## 2013-08-15 NOTE — Interval H&P Note (Signed)
History and Physical Interval Note:  08/15/2013 9:29 AM  Theresa Morrow  has presented today for surgery, with the diagnosis of gerd  The various methods of treatment have been discussed with the patient and family. After consideration of risks, benefits and other options for treatment, the patient has consented to  Procedure(s): ESOPHAGOGASTRODUODENOSCOPY (EGD) (N/A) as a surgical intervention .  The patient's history has been reviewed, patient examined, no change in status, stable for surgery.  I have reviewed the patient's chart and labs.  Questions were answered to the patient's satisfaction.     Lafayette Dragon

## 2013-08-15 NOTE — Telephone Encounter (Signed)
Scheduled GES on 09/01/13 at [facility] radiology at 9:30 AM. Hold stomach medications day before. NPO after midnight. Unable to leave a message on voice mail. Will try again later.

## 2013-08-15 NOTE — Discharge Instructions (Addendum)
Moderate Sedation, Adult °Moderate sedation is given to help you relax or even sleep through a procedure. You may remain sleepy, be clumsy, or have poor balance for several hours following this procedure. Arrange for a responsible adult, family member, or friend to take you home. A responsible adult should stay with you for at least 24 hours or until the medicines have worn off. °· Do not participate in any activities where you could become injured for the next 24 hours, or until you feel normal again. Do not: °· Drive. °· Swim. °· Ride a bicycle. °· Operate heavy machinery. °· Cook. °· Use power tools. °· Climb ladders. °· Work at heights. °· Do not make important decisions or sign legal documents until you are improved. °· Vomiting may occur if you eat too soon. When you can drink without vomiting, try water, juice, or soup. Try solid foods if you feel little or no nausea. °· Only take over-the-counter or prescription medications for pain, discomfort, or fever as directed by your caregiver.If pain medications have been prescribed for you, ask your caregiver how soon it is safe to take them. °· Make sure you and your family fully understands everything about the medication given to you. Make sure you understand what side effects may occur. °· You should not drink alcohol, take sleeping pills, or medications that cause drowsiness for at least 24 hours. °· If you smoke, do not smoke alone. °· If you are feeling better, you may resume normal activities 24 hours after receiving sedation. °· Keep all appointments as scheduled. Follow all instructions. °· Ask questions if you do not understand. °SEEK MEDICAL CARE IF:  °· Your skin is pale or bluish in color. °· You continue to feel sick to your stomach (nauseous) or throw up (vomit). °· Your pain is getting worse and not helped by medication. °· You have bleeding or swelling. °· You are still sleepy or feeling clumsy after 24 hours. °SEEK IMMEDIATE MEDICAL CARE IF:   °· You develop a rash. °· You have difficulty breathing. °· You develop any type of allergic problem. °· You have a fever. °Document Released: 11/22/2000 Document Revised: 05/22/2011 Document Reviewed: 11/04/2012 °[employer]® Patient Information ©2014 [employer]. ° °

## 2013-08-18 ENCOUNTER — Encounter (HOSPITAL_COMMUNITY): Payer: Self-pay | Admitting: Internal Medicine

## 2013-08-19 ENCOUNTER — Encounter: Payer: Self-pay | Admitting: Internal Medicine

## 2013-08-19 NOTE — Telephone Encounter (Signed)
Unable to reach patient. No mail box set up.

## 2013-08-19 NOTE — Telephone Encounter (Signed)
Unable to reach patient. Mail box not set up yet. Will try again later.

## 2013-08-19 NOTE — Telephone Encounter (Signed)
Unable to reach patient.

## 2013-08-21 NOTE — Telephone Encounter (Signed)
Unable to reach patient. Mailed her a letter with appointment date, time and instructions.

## 2013-09-01 ENCOUNTER — Encounter (HOSPITAL_COMMUNITY): Payer: No Typology Code available for payment source

## 2014-04-28 ENCOUNTER — Telehealth: Payer: Self-pay | Admitting: Hematology

## 2014-04-28 ENCOUNTER — Other Ambulatory Visit: Payer: Self-pay | Admitting: Physician Assistant

## 2014-04-28 DIAGNOSIS — Z1231 Encounter for screening mammogram for malignant neoplasm of breast: Secondary | ICD-10-CM

## 2014-04-29 ENCOUNTER — Other Ambulatory Visit: Payer: Self-pay | Admitting: Family Medicine

## 2014-04-29 DIAGNOSIS — D333 Benign neoplasm of cranial nerves: Secondary | ICD-10-CM

## 2014-04-29 NOTE — Telephone Encounter (Signed)
pt confirmed appt for 05/18/14 at 2:30 [facility] Dx: Leukopenia Referring [facility] [place]

## 2014-05-05 ENCOUNTER — Ambulatory Visit: Payer: No Typology Code available for payment source

## 2014-05-12 ENCOUNTER — Ambulatory Visit
Admission: RE | Admit: 2014-05-12 | Discharge: 2014-05-12 | Disposition: A | Discharge status: Home / Self Care | Payer: 59 | Source: Ambulatory Visit | Attending: Family Medicine | Admitting: Family Medicine

## 2014-05-12 DIAGNOSIS — D333 Benign neoplasm of cranial nerves: Secondary | ICD-10-CM

## 2014-05-12 MED ORDER — GADOBENATE DIMEGLUMINE 529 MG/ML IV SOLN
17.0000 mL | Freq: Once | INTRAVENOUS | Status: AC | PRN
  Administered 2014-05-12: 17 mL via INTRAVENOUS

## 2014-05-18 ENCOUNTER — Ambulatory Visit: Payer: No Typology Code available for payment source | Admitting: Hematology

## 2014-05-18 ENCOUNTER — Ambulatory Visit: Payer: No Typology Code available for payment source

## 2015-02-03 ENCOUNTER — Other Ambulatory Visit: Payer: Self-pay | Admitting: *Deleted

## 2015-02-03 ENCOUNTER — Telehealth: Payer: Self-pay | Admitting: *Deleted

## 2015-02-03 MED ORDER — PANTOPRAZOLE SODIUM 40 MG PO TBEC: 40.0000 mg | DELAYED_RELEASE_TABLET | Freq: Every evening | ORAL | Status: DC

## 2015-02-03 MED ORDER — SUCRALFATE 1 G PO TABS: 1.0000 g | ORAL_TABLET | Freq: Two times a day (BID) | ORAL | Status: DC

## 2015-02-03 MED ORDER — RANITIDINE HCL 150 MG PO TABS: 150.0000 mg | ORAL_TABLET | ORAL | Status: DC

## 2015-02-03 NOTE — Telephone Encounter (Signed)
Patient has an appointment scheduled with Dr Silverio Decamp on 12/1   Refilled patients med until office appointment

## 2015-02-11 ENCOUNTER — Ambulatory Visit (INDEPENDENT_AMBULATORY_CARE_PROVIDER_SITE_OTHER): Payer: 59 | Admitting: Gastroenterology

## 2015-02-11 ENCOUNTER — Encounter: Payer: Self-pay | Admitting: Gastroenterology

## 2015-02-11 VITALS — BP 118/70 | Ht 67.0 in | Wt 184.0 lb

## 2015-02-11 DIAGNOSIS — Z9884 Bariatric surgery status: Secondary | ICD-10-CM

## 2015-02-11 DIAGNOSIS — K2100 Gastro-esophageal reflux disease with esophagitis, without bleeding: Secondary | ICD-10-CM

## 2015-02-11 DIAGNOSIS — K219 Gastro-esophageal reflux disease without esophagitis: Secondary | ICD-10-CM | POA: Diagnosis not present

## 2015-02-11 MED ORDER — CHOLESTYRAMINE LIGHT 4 G PO PACK: 4.0000 g | PACK | Freq: Four times a day (QID) | ORAL | Status: DC

## 2015-02-11 NOTE — Progress Notes (Signed)
Theresa Morrow    [id number]    10/06/1955  Primary Care Physician:BURNETT,BRENT A, MD  Referring Physician: Stephens Shire, MD [address] [place], [place] [address]  Chief complaint: Bile reflux HPI:  59 year old white female here for follow up with c/o bile reflux and heartburn.  She also c/onocturnal cough and possible frequent aspiration. Had an episode of aspiration pneumonia in March 2016. She has tried PPI without improvement. She denies dysphagia or odynophagia. She underwent an Roux-en-Y gastrojejunostomy for obesity in 2011 and subsequently was hospitalized in September 2012 with choledocholithiasis, cholangitis and sepsis. Because of her gastrojejunal bypass, she was unable to undergo ERCP and instead had a percutaneous transhepatic cholangiogram with placement of internal and external stent and removal of common bile duct stones. She has not had any problems since then. She initially lost 150 pounds and has and has gained back some weight since.  She underwent a colonoscopy 8 years ago in another facility which apparently was a normal exam and she will be due for a repeat colonoscopy in 2017. She had an open cholecystectomy at age 9 for cholelithiasis. She underwent upper endoscopy in 2015, was unremarkable. She continues to have reflux symptoms and heartburn despite dietary and lifestyle modification.    Outpatient Encounter Prescriptions as of 02/11/2015  Medication Sig  . CALCIUM-VITAMIN D PO Take 1 tablet by mouth daily.  Marland Kitchen HYDROcodone-acetaminophen (NORCO) 10-325 MG per tablet Take 1 tablet by mouth every 6 (six) hours as needed.  . Multiple Vitamin (MULTIVITAMIN) tablet Take 1 tablet by mouth daily.  . vitamin B-12 (CYANOCOBALAMIN) 1000 MCG tablet Take 1,000 mcg by mouth daily.  . Calcium Carbonate Antacid (TUMS PO) Take 2 tablets by mouth at bedtime.  . cholestyramine light (PREVALITE) 4 G packet Take 1 packet (4 g total) by mouth 4 (four)  times daily.  . [DISCONTINUED] metoCLOPramide (REGLAN) 5 MG tablet Take 1 tablet (5 mg total) by mouth 3 (three) times daily before meals. (Patient not taking: Reported on 02/11/2015)  . [DISCONTINUED] pantoprazole (PROTONIX) 40 MG tablet Take 1 tablet (40 mg total) by mouth every evening. (Patient not taking: Reported on 02/11/2015)  . [DISCONTINUED] ranitidine (ZANTAC) 150 MG tablet Take 1 tablet (150 mg total) by mouth every morning. (Patient not taking: Reported on 02/11/2015)  . [DISCONTINUED] sucralfate (CARAFATE) 1 G tablet Take 1 tablet (1 g total) by mouth 2 (two) times daily. (Patient not taking: Reported on 02/11/2015)   No facility-administered encounter medications on file as of 02/11/2015.    Allergies as of 02/11/2015  . (No Known Allergies)    Past Medical History  Diagnosis Date  . Osteoarthritis   . Cholangitis   . Choledocholithiasis   . Hepatomegaly   . Anemia   . Chronic headaches   . GERD (gastroesophageal reflux disease)   . Kidney stones   . Obesity   . Bile duct stone     liver stones  . Complication of anesthesia     low heart rate    Past Surgical History  Procedure Laterality Date  . Roux-en-y gastric bypass  2011    and removed liver stones  . Knee arthroscopy Left 2004  . Cholecystectomy open  1977  . Acoustic neuroma resection  2007  . Esophagogastroduodenoscopy N/A 08/15/2013    Procedure: ESOPHAGOGASTRODUODENOSCOPY (EGD);  Surgeon: Lafayette Dragon, MD;  Location: [facility];  Service: Endoscopy;  Laterality: N/A;  Family History  Problem Relation Age of Onset  . Heart disease Mother   . Gallbladder disease Mother   . Colon cancer Neg Hx   . Gallbladder disease Sister   . Gallbladder disease Maternal Grandmother   . Diabetes Mother   . Kidney cancer Father     Social History   Social History  . Marital Status: Married    Spouse Name: N/A  . Number of Children: 3  . Years of Education: N/A   Occupational History  . bookkeeper     Social History Main Topics  . Smoking status: Never Smoker   . Smokeless tobacco: Never Used  . Alcohol Use: Yes     Comment: rarely  . Drug Use: No  . Sexual Activity: Not on file   Other Topics Concern  . Not on file   Social History Narrative      Review of systems: Review of Systems  Constitutional: Negative for fever and chills.  HENT: Negative.   Eyes: Negative for blurred vision.  Respiratory: Negative for cough, shortness of breath and wheezing.   Cardiovascular: Negative for chest pain and palpitations.  Gastrointestinal: as per HPI Genitourinary: Negative for dysuria, urgency, frequency and hematuria.  Musculoskeletal: Negative for myalgias, back pain and joint pain.  Skin: Negative for itching and rash.  Neurological: Negative for dizziness, tremors, focal weakness, seizures and loss of consciousness.  Endo/Heme/Allergies: Negative for environmental allergies.  Psychiatric/Behavioral: Negative for depression, suicidal ideas and hallucinations.  All other systems reviewed and are negative.   Physical Exam: Filed Vitals:   02/11/15 1003  BP: 118/70   Gen:      No acute distress HEENT:  EOMI, sclera anicteric Neck:     No masses; no thyromegaly Lungs:    Clear to auscultation bilaterally; normal respiratory effort CV:         Regular rate and rhythm; no murmurs Abd:      + bowel sounds; soft, non-tender; no palpable masses, no distension Ext:    No edema; adequate peripheral perfusion Skin:      Warm and dry; no rash Neuro: alert and oriented x 3 Psych: normal mood and affect  Data Reviewed:  EGD 08/2013 Esophagus, proximal and mid esophageal mucosa was normal. There was mild erythema at the GE junction which was consistent with grade 1 esophagitis. Biopsies were obtained. There was no stricture or hiatal hernia. Squamocolumnar junction was located at 35 cm from the incisors Stomach: Gastric remnant measured 5 cm extending from 35-40 cm from the  incisors. Gastrojejunostomy anastomosis was a slightly narrowed but admitted the endoscope without resistance. There was gastritis within the anastomosis. The mucosa appeared slightly friable within the anastomosis but there was no ulcer. Gastric mucosa in the fundus and body of the stomach was normal. Retroflexion of the endoscope revealing normal fundus and cardia Jejunum: efferent limb of the jejunostomy appeared normal and was examined over the length of at least 15-20 cm. There was no evidence of fluid retention or food retention. The scope was then withdrawn from the patient and the procedure completed.   Assessment and Plan/Recommendations:  59 year old female status post Roux-en-Y gastric bypass with complaints of heartburn and bile reflux. She had choledocholithiasis in 2012 which was treated with internal/external drain as she could not undergo ERCP due to her Roux-en-Y.  She has tried multiple PPIs and antacids with no improvement in symptoms She also tried sucralfate and Reglan in the past with no improvement We'll obtain an upper GI  series to evaluate and based on the findings we will consider esophageal manometry and 24-hour pH impedance May consider baclofen to treat the reflux symptoms Will do a trial of cholestyramine for 2 weeks  Return after upper GI series  K. Denzil Magnuson , MD [phone] Mon-Fri 8a-5p [phone] after 5p, weekends, holidays

## 2015-02-11 NOTE — Patient Instructions (Signed)
You have been scheduled for an Upper GI Series/small bowel follow through  At _________________ . Your appointment is on _____________ . Please arrive 15 minutes prior to your test for registration. Make sure not to eat or drink anything after midnight on the night before your test. If you need to reschedule, please call radiology at [phone]. ________________________________________________________________ An upper GI series uses x rays to help diagnose problems of the upper GI tract, which includes the esophagus, stomach, and duodenum. The duodenum is the first part of the small intestine. An upper GI series is conducted by a radiology technologist or a radiologist-a doctor who specializes in x-ray imaging-at a hospital or outpatient center. While sitting or standing in front of an x-ray machine, the patient drinks barium liquid, which is often white and has a chalky consistency and taste. The barium liquid coats the lining of the upper GI tract and makes signs of disease show up more clearly on x rays. X-ray video, called fluoroscopy, is used to view the barium liquid moving through the esophagus, stomach, and duodenum. Additional x rays and fluoroscopy are performed while the patient lies on an x-ray table. To fully coat the upper GI tract with barium liquid, the technologist or radiologist may press on the abdomen or ask the patient to change position. Patients hold still in various positions, allowing the technologist or radiologist to take x rays of the upper GI tract at different angles. If a technologist conducts the upper GI series, a radiologist will later examine the images to look for problems.  This test typically takes about 1 hour to complete. __________________________________________________________________  Follow up in 2 months

## 2015-02-12 ENCOUNTER — Telehealth: Payer: Self-pay | Admitting: *Deleted

## 2015-02-12 NOTE — Telephone Encounter (Signed)
Called patient to inform UGI is scheduled on 02/18/2015 at 9:30am arrive at [place] at [facility] radiology  NPO after midnight  Gave patient instructions  Patient concerned with cost of test.  I told her to contact [facility] radiology and ask them or she may have to contact her insurance company and tell them the test she is having

## 2015-02-18 ENCOUNTER — Other Ambulatory Visit (HOSPITAL_COMMUNITY): Payer: 59

## 2015-10-20 ENCOUNTER — Encounter: Payer: Self-pay | Admitting: Gastroenterology

## 2015-11-24 ENCOUNTER — Telehealth: Payer: Self-pay | Admitting: *Deleted

## 2015-11-24 NOTE — Telephone Encounter (Signed)
Late entry- pt missed her PV scheduled 11-17-15.  I did phone her that day to reschedule and she states, "I already cancelled my appointment and rescheduled it for 12-08-15." I explained that we had scheduled her colonoscopy on 12-08-15.  She asks if both visits can be done at once.  I explained that she has to have a PV to get her prep instructions.  She states that she doesn't have her schedule and cannot possible reschedule it today.  I told her to call us back as soon as possible.  She hasn't rescheduled as of today, so I am sending a letter.

## 2015-12-01 ENCOUNTER — Encounter: Payer: Self-pay | Admitting: Gastroenterology

## 2015-12-08 ENCOUNTER — Encounter: Payer: Self-pay | Admitting: Gastroenterology

## 2015-12-16 ENCOUNTER — Ambulatory Visit (AMBULATORY_SURGERY_CENTER): Payer: Self-pay

## 2015-12-16 ENCOUNTER — Encounter: Payer: Self-pay | Admitting: Gastroenterology

## 2015-12-16 VITALS — Ht 68.0 in | Wt 186.0 lb

## 2015-12-16 DIAGNOSIS — Z1211 Encounter for screening for malignant neoplasm of colon: Secondary | ICD-10-CM

## 2015-12-16 MED ORDER — SUPREP BOWEL PREP KIT 17.5-3.13-1.6 GM/177ML PO SOLN: 1.0000 | Freq: Once | ORAL | 0 refills | Status: AC

## 2015-12-16 NOTE — Progress Notes (Signed)
No allergies to eggs or soy No past problems with anesthesia EXCEPT low heart rate during roux n y No diet meds No home oxygen  Registered emmi

## 2015-12-23 ENCOUNTER — Ambulatory Visit (AMBULATORY_SURGERY_CENTER): Payer: BLUE CROSS/BLUE SHIELD | Admitting: Gastroenterology

## 2015-12-23 ENCOUNTER — Encounter: Payer: Self-pay | Admitting: Gastroenterology

## 2015-12-23 VITALS — BP 125/74 | HR 63 | Temp 98.6°F | Resp 11 | Ht 67.0 in | Wt 184.0 lb

## 2015-12-23 DIAGNOSIS — Z1212 Encounter for screening for malignant neoplasm of rectum: Secondary | ICD-10-CM | POA: Diagnosis not present

## 2015-12-23 DIAGNOSIS — Z1211 Encounter for screening for malignant neoplasm of colon: Secondary | ICD-10-CM

## 2015-12-23 DIAGNOSIS — K219 Gastro-esophageal reflux disease without esophagitis: Secondary | ICD-10-CM

## 2015-12-23 MED ORDER — SODIUM CHLORIDE 0.9 % IV SOLN: 500.0000 mL | INTRAVENOUS | Status: AC

## 2015-12-23 MED ORDER — PANTOPRAZOLE SODIUM 40 MG PO TBEC: 40.0000 mg | DELAYED_RELEASE_TABLET | Freq: Every day | ORAL | 0 refills | Status: DC

## 2015-12-23 NOTE — Patient Instructions (Signed)
Impression/Recommendations:  Polyp handout given. Hemorrhoid handout given.  Protonix 40 mg. By mouth once daily.  Repeat colonoscopy in 10 years.  (2027).  YOU HAD AN ENDOSCOPIC PROCEDURE TODAY AT [facility]:   Refer to the procedure report that was given to you for any specific questions about what was found during the examination.  If the procedure report does not answer your questions, please call your gastroenterologist to clarify.  If you requested that your care partner not be given the details of your procedure findings, then the procedure report has been included in a sealed envelope for you to review at your convenience later.  YOU SHOULD EXPECT: Some feelings of bloating in the abdomen. Passage of more gas than usual.  Walking can help get rid of the air that was put into your GI tract during the procedure and reduce the bloating. If you had a lower endoscopy (such as a colonoscopy or flexible sigmoidoscopy) you may notice spotting of blood in your stool or on the toilet paper. If you underwent a bowel prep for your procedure, you may not have a normal bowel movement for a few days.  Please Note:  You might notice some irritation and congestion in your nose or some drainage.  This is from the oxygen used during your procedure.  There is no need for concern and it should clear up in a day or so.  SYMPTOMS TO REPORT IMMEDIATELY:   Following lower endoscopy (colonoscopy or flexible sigmoidoscopy):  Excessive amounts of blood in the stool  Significant tenderness or worsening of abdominal pains  Swelling of the abdomen that is new, acute  Fever of 100F or higher   For urgent or emergent issues, a gastroenterologist can be reached at any hour by calling [phone].   DIET:  We do recommend a small meal at first, but then you may proceed to your regular diet.  Drink plenty of fluids but you should avoid alcoholic beverages for 24 hours.  ACTIVITY:  You should  plan to take it easy for the rest of today and you should NOT DRIVE or use heavy machinery until tomorrow (because of the sedation medicines used during the test).    FOLLOW UP: Our staff will call the number listed on your records the next business day following your procedure to check on you and address any questions or concerns that you may have regarding the information given to you following your procedure. If we do not reach you, we will leave a message.  However, if you are feeling well and you are not experiencing any problems, there is no need to return our call.  We will assume that you have returned to your regular daily activities without incident.  If any biopsies were taken you will be contacted by phone or by letter within the next 1-3 weeks.  Please call us at [phone] if you have not heard about the biopsies in 3 weeks.    SIGNATURES/CONFIDENTIALITY: You and/or your care partner have signed paperwork which will be entered into your electronic medical record.  These signatures attest to the fact that that the information above on your After Visit Summary has been reviewed and is understood.  Full responsibility of the confidentiality of this discharge information lies with you and/or your care-partner.

## 2015-12-23 NOTE — Op Note (Addendum)
[facility] Patient Name: Theresa Morrow Procedure Date: 12/23/2015 3:30 PM MRN: [id number] Endoscopist: Mauri Pole , MD Age: 60 Referring MD:  Date of Birth: [birth year] Gender: Female Account #: [id number] Procedure:                Colonoscopy Indications:              Screening for colorectal malignant neoplasm, Last                            colonoscopy 10 years ago Medicines:                Monitored Anesthesia Care Procedure:                Pre-Anesthesia Assessment:                           - Prior to the procedure, a History and Physical                            was performed, and patient medications and                            allergies were reviewed. The patient's tolerance of                            previous anesthesia was also reviewed. The risks                            and benefits of the procedure and the sedation                            options and risks were discussed with the patient.                            All questions were answered, and informed consent                            was obtained. Prior Anticoagulants: The patient has                            taken no previous anticoagulant or antiplatelet                            agents. ASA Grade Assessment: II - A patient with                            mild systemic disease. After reviewing the risks                            and benefits, the patient was deemed in                            satisfactory condition to undergo the procedure.  After obtaining informed consent, the colonoscope                            was passed under direct vision. Throughout the                            procedure, the patient's blood pressure, pulse, and                            oxygen saturations were monitored continuously. The                            Model CF-HQ190L [id number]) scope was introduced                            through the anus and advanced  to the the cecum,                            identified by appendiceal orifice and ileocecal                            valve. The colonoscopy was performed without                            difficulty. The patient tolerated the procedure                            well. The quality of the bowel preparation was                            good. The ileocecal valve, appendiceal orifice, and                            rectum were photographed. Scope In: 3:44:22 PM Scope Out: 4:04:20 PM Scope Withdrawal Time: 0 hours 13 minutes 1 second  Total Procedure Duration: 0 hours 19 minutes 58 seconds  Findings:                 The perianal and digital rectal examinations were                            normal.                           A few small-mouthed diverticula were found in the                            sigmoid colon.                           Non-bleeding internal hemorrhoids were found during                            retroflexion. The hemorrhoids were small. Complications:            No immediate complications. Estimated Blood Loss:  Estimated blood loss: none. Impression:               - Diverticulosis in the sigmoid colon.                           - Non-bleeding internal hemorrhoids.                           - No specimens collected. Recommendation:           - Patient has a contact number available for                            emergencies. The signs and symptoms of potential                            delayed complications were discussed with the                            patient. Return to normal activities tomorrow.                            Written discharge instructions were provided to the                            patient.                           - Resume previous diet.                           - Continue present medications.                           - Repeat colonoscopy in 10 years for screening                            purposes.                           -  Return to GI clinic PRN.                           - Protonix 40mg  daily x 2 months Mauri Pole, MD 12/23/2015 4:21:02 PM This report has been signed electronically.

## 2015-12-23 NOTE — Progress Notes (Signed)
To recovery vss report to [employer]

## 2015-12-24 ENCOUNTER — Telehealth: Payer: Self-pay

## 2015-12-24 NOTE — Telephone Encounter (Signed)
  Follow up Call-  Call back number 12/23/2015  Post procedure Call Back phone  # [phone]  Permission to leave phone message Yes  Some recent data might be hidden     Patient questions:  Do you have a fever, pain , or abdominal swelling? No. Pain Score  0 *  Have you tolerated food without any problems? Yes.    Have you been able to return to your normal activities? Yes.    Do you have any questions about your discharge instructions: Diet   No. Medications  No. Follow up visit  No.  Do you have questions or concerns about your Care? No.  Actions: * If pain score is 4 or above: No action needed, pain <4.

## 2016-05-01 ENCOUNTER — Encounter (HOSPITAL_COMMUNITY): Payer: Self-pay | Admitting: Emergency Medicine

## 2016-05-01 ENCOUNTER — Ambulatory Visit (HOSPITAL_COMMUNITY)
Admission: EM | Admit: 2016-05-01 | Discharge: 2016-05-01 | Disposition: A | Discharge status: Home / Self Care | Payer: BLUE CROSS/BLUE SHIELD | Attending: Internal Medicine | Admitting: Internal Medicine

## 2016-05-01 DIAGNOSIS — J111 Influenza due to unidentified influenza virus with other respiratory manifestations: Secondary | ICD-10-CM

## 2016-05-01 DIAGNOSIS — R69 Illness, unspecified: Secondary | ICD-10-CM

## 2016-05-01 MED ORDER — BENZONATATE 100 MG PO CAPS: 100.0000 mg | ORAL_CAPSULE | Freq: Three times a day (TID) | ORAL | 0 refills | Status: DC

## 2016-05-01 MED ORDER — OSELTAMIVIR PHOSPHATE 75 MG PO CAPS: 75.0000 mg | ORAL_CAPSULE | Freq: Two times a day (BID) | ORAL | 0 refills | Status: DC

## 2016-05-01 NOTE — ED Triage Notes (Signed)
The patient presented to the [facility] with a complaint of fever and general body aches that started yesterday.

## 2016-05-01 NOTE — Discharge Instructions (Signed)
You most likely have a influenza or an influenza-like illness. I advise rest, plenty of fluids and management of symptoms with over the counter medicines. For symptoms you may take Tylenol as needed every 4-6 hours for body aches or fever, not to exceed 4,000 mg a day, Take mucinex or mucinex DM ever 12 hours with a full glass of water, you may use an inhaled steroid such as Flonase, 2 sprays each nostril once a day for congestion, or an antihistamine such as Claritin or Zyrtec once a day. For cough, I have prescribed a medication called Tessalon. Take 1 tablet every 8 hours as needed for your cough. For treatment of the flu I have prescribed Tamiflu, take 1 tablet twice a day for 5 days. Should your symptoms worsen or fail to resolve, follow up with your primary care provider or return to clinic.

## 2016-05-01 NOTE — ED Provider Notes (Signed)
CSN: [id number]     Arrival date & time 05/01/16  1836 History   First MD Initiated Contact with Patient 05/01/16 2019     Chief Complaint  Patient presents with  . Generalized Body Aches   (Consider location/radiation/quality/duration/timing/severity/associated sxs/prior Treatment) 61 year old female patient presents with 24-hour history of muscle aches, body aches, fatigue, loss of appetite, sore throat, congestion, and cough. Her fever has been as high as 103 at home states she has taken Tylenol and aspirin and it has come down to 99. She denies nausea, vomiting, or diarrhea additionally she denies abdominal pain as well she states she does not smoke, no history of chronic lung disease, no history of hypertension, or diabetes.   The history is provided by the patient.    Past Medical History:  Diagnosis Date  . Anemia   . Bile duct stone    liver stones  . Cancer [facility])    brain tumor 2007  . Cholangitis   . Choledocholithiasis   . Chronic headaches   . Complication of anesthesia    low heart rate  . GERD (gastroesophageal reflux disease)   . Hepatomegaly   . Kidney stones   . Obesity   . Osteoarthritis    Past Surgical History:  Procedure Laterality Date  . ACOUSTIC NEUROMA RESECTION  2007  . CHOLECYSTECTOMY OPEN  1977  . ESOPHAGOGASTRODUODENOSCOPY N/A 08/15/2013   Procedure: ESOPHAGOGASTRODUODENOSCOPY (EGD);  Surgeon: Lafayette Dragon, MD;  Location: [facility];  Service: Endoscopy;  Laterality: N/A;  . KNEE ARTHROSCOPY Left 2004  . ROUX-EN-Y GASTRIC BYPASS  2011   and removed liver stones   Family History  Problem Relation Age of Onset  . Heart disease Mother   . Gallbladder disease Mother   . Diabetes Mother   . Gallbladder disease Sister   . Gallbladder disease Maternal Grandmother   . Kidney cancer Father   . Colon cancer Neg Hx    Social History  Substance Use Topics  . Smoking status: Never Smoker  . Smokeless tobacco: Never Used  . Alcohol use No   OB  History    No data available     Review of Systems  Reason unable to perform ROS: as covered in HPI.  All other systems reviewed and are negative.   Allergies  Patient has no known allergies.  Home Medications   Prior to Admission medications   Medication Sig Start Date End Date Taking? Authorizing Provider  benzonatate (TESSALON) 100 MG capsule Take 1 capsule (100 mg total) by mouth every 8 (eight) hours. 05/01/16   Barnet Glasgow, NP  oseltamivir (TAMIFLU) 75 MG capsule Take 1 capsule (75 mg total) by mouth every 12 (twelve) hours. 05/01/16   Barnet Glasgow, NP   Meds Ordered and Administered this Visit  Medications - No data to display  BP 93/62 (BP Location: Right Arm)   Pulse 85   Temp 98.8 F (37.1 C) (Oral)   Resp 20   SpO2 96%  No data found.   Physical Exam  Constitutional: She is oriented to person, place, and time. She appears well-developed and well-nourished. She appears ill. No distress.  HENT:  Head: Normocephalic and atraumatic.  Right Ear: Tympanic membrane and external ear normal.  Left Ear: Tympanic membrane and external ear normal.  Nose: Nose normal. Right sinus exhibits no maxillary sinus tenderness and no frontal sinus tenderness. Left sinus exhibits no maxillary sinus tenderness and no frontal sinus tenderness.  Mouth/Throat: Uvula is midline and oropharynx is  clear and moist. No oropharyngeal exudate.  Eyes: Pupils are equal, round, and reactive to light.  Neck: Normal range of motion. Neck supple. No JVD present.  Cardiovascular: Normal rate and regular rhythm.   Pulmonary/Chest: Effort normal and breath sounds normal. No respiratory distress. She has no wheezes.  Abdominal: Soft. Bowel sounds are normal. She exhibits no distension. There is no tenderness. There is no guarding.  Lymphadenopathy:       Head (right side): No submental, no submandibular, no tonsillar and no preauricular adenopathy present.       Head (left side): No submental, no  submandibular, no tonsillar and no preauricular adenopathy present.    She has no cervical adenopathy.  Neurological: She is alert and oriented to person, place, and time.  Skin: Skin is warm and dry. Capillary refill takes less than 2 seconds. She is not diaphoretic.  Psychiatric: She has a normal mood and affect.  Nursing note and vitals reviewed.   Urgent Care Course     Procedures (including critical care time)  Labs Review Labs Reviewed - No data to display  Imaging Review No results found.   Visual Acuity Review  Right Eye Distance:   Left Eye Distance:   Bilateral Distance:    Right Eye Near:   Left Eye Near:    Bilateral Near:         MDM   1. Influenza-like illness    You most likely have a influenza or an influenza-like illness. I advise rest, plenty of fluids and management of symptoms with over the counter medicines. For symptoms you may take Tylenol as needed every 4-6 hours for body aches or fever, not to exceed 4,000 mg a day, Take mucinex or mucinex DM ever 12 hours with a full glass of water, you may use an inhaled steroid such as Flonase, 2 sprays each nostril once a day for congestion, or an antihistamine such as Claritin or Zyrtec once a day. For cough, I have prescribed a medication called Tessalon. Take 1 tablet every 8 hours as needed for your cough. For treatment of the flu I have prescribed Tamiflu, take 1 tablet twice a day for 5 days. Should your symptoms worsen or fail to resolve, follow up with your primary care provider or return to clinic.       Barnet Glasgow, NP 05/01/16 2031

## 2016-05-18 ENCOUNTER — Ambulatory Visit (INDEPENDENT_AMBULATORY_CARE_PROVIDER_SITE_OTHER): Payer: BLUE CROSS/BLUE SHIELD | Admitting: Gastroenterology

## 2016-05-18 ENCOUNTER — Encounter: Payer: Self-pay | Admitting: Gastroenterology

## 2016-05-18 VITALS — BP 124/70 | HR 60 | Ht 67.0 in | Wt 190.4 lb

## 2016-05-18 DIAGNOSIS — M26622 Arthralgia of left temporomandibular joint: Secondary | ICD-10-CM

## 2016-05-18 DIAGNOSIS — R1310 Dysphagia, unspecified: Secondary | ICD-10-CM | POA: Diagnosis not present

## 2016-05-18 NOTE — Progress Notes (Signed)
Theresa Morrow    [id number]    [birth year]  Primary Care Physician:[facility]  Referring Physician: Stephens Shire, MD [address] [place], [place] [address]  Chief complaint:  Dysphagia, jaw pain  HPI: 61 year old female with history of gastric bypass Roux-en-Y surgery in 2011,  choledocholithiasis 2012 with cholangitis status post percutaneous transhepatic biliary drain is here with complaints of difficulty swallowing with mostly to pills and sometimes food. She cuts her pills in half including Norco but feels its sitting in her throat. No difficulty with liquids. She recently underwent dental extractions and procedures, subsequently has been having pain in the left side of her jaw, she feels swelling on the left side and pains radiating into her ear, affecting her hearing ability. She has increased pain with chewing or opening mouth. Denies any nausea, vomiting, abdominal pain, constipation, diarrhea, melena or blood per rectum.    Outpatient Encounter Prescriptions as of 05/18/2016  Medication Sig  . calcium carbonate (TUMS - DOSED IN MG ELEMENTAL CALCIUM) 500 MG chewable tablet Chew 1 tablet by mouth as needed for indigestion or heartburn.  . cyanocobalamin (,VITAMIN B-12,) 1000 MCG/ML injection Inject 1,000 mcg into the muscle every 30 (thirty) days.  Marland Kitchen HYDROcodone-acetaminophen (NORCO) 10-325 MG tablet Take 1 tablet by mouth every 6 (six) hours as needed.  . meloxicam (MOBIC) 7.5 MG tablet Take 1 tablet by mouth daily.  . [DISCONTINUED] benzonatate (TESSALON) 100 MG capsule Take 1 capsule (100 mg total) by mouth every 8 (eight) hours.  . [DISCONTINUED] oseltamivir (TAMIFLU) 75 MG capsule Take 1 capsule (75 mg total) by mouth every 12 (twelve) hours.   Facility-Administered Encounter Medications as of 05/18/2016  Medication  . 0.9 %  sodium chloride infusion    Allergies as of 05/18/2016  . (No Known Allergies)    Past  Medical History:  Diagnosis Date  . Anemia   . Bile duct stone    liver stones  . Cancer [facility])    brain tumor 2007  . Cholangitis   . Choledocholithiasis   . Chronic headaches   . Complication of anesthesia    low heart rate  . GERD (gastroesophageal reflux disease)   . Hepatomegaly   . Kidney stones   . Obesity   . Osteoarthritis     Past Surgical History:  Procedure Laterality Date  . ACOUSTIC NEUROMA RESECTION  2007  . CHOLECYSTECTOMY OPEN  1977  . ESOPHAGOGASTRODUODENOSCOPY N/A 08/15/2013   Procedure: ESOPHAGOGASTRODUODENOSCOPY (EGD);  Surgeon: Lafayette Dragon, MD;  Location: [facility];  Service: Endoscopy;  Laterality: N/A;  . KNEE ARTHROSCOPY Left 2004  . ROUX-EN-Y GASTRIC BYPASS  2011   and removed liver stones    Family History  Problem Relation Age of Onset  . Heart disease Mother   . Gallbladder disease Mother   . Diabetes Mother   . Gallbladder disease Sister   . Gallbladder disease Maternal Grandmother   . Kidney cancer Father   . Colon cancer Neg Hx     Social History   Social History  . Marital status: Married    Spouse name: N/A  . Number of children: 3  . Years of education: N/A   Occupational History  . bookkeeper    Social History Main Topics  . Smoking status: Never Smoker  . Smokeless tobacco: Never Used  . Alcohol use No  . Drug use: No  . Sexual activity:  Not on file   Other Topics Concern  . Not on file   Social History Narrative  . No narrative on file      Review of systems: Review of Systems  Constitutional: Negative for fever and chills.  HENT: Negative.   Eyes: Negative for blurred vision.  Respiratory: Negative for cough, shortness of breath and wheezing.   Cardiovascular: Negative for chest pain and palpitations.  Gastrointestinal: as per HPI Genitourinary: Negative for dysuria, urgency, frequency and hematuria.  Musculoskeletal: Negative for myalgias, back pain and joint pain.  Skin: Negative for itching and  rash.  Neurological: Negative for dizziness, tremors, focal weakness, seizures and loss of consciousness.  Endo/Heme/Allergies: Positive for seasonal allergies.  Psychiatric/Behavioral: Negative for depression, suicidal ideas and hallucinations.  All other systems reviewed and are negative.   Physical Exam: Vitals:   05/18/16 0951  BP: 124/70  Pulse: 60   Body mass index is 29.82 kg/m. 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 with point tenderness Neck:     No masses; no thyromegaly Lungs:    Clear to auscultation bilaterally; normal respiratory effort CV:         Regular rate and rhythm; no murmurs Abd:      + bowel sounds; soft, non-tender; no palpable masses, no distension Ext:    No edema; adequate peripheral perfusion Skin:      Warm and dry; no rash Neuro: alert and oriented x 3 Psych: normal mood and affect  Data Reviewed:  Reviewed labs, radiology imaging, old records and pertinent past GI work up   Assessment and Plan/Recommendations: 61 year old female status post gastric bypass Roux-en-Y surgery, brain tumor, chronic osteoarthritis on narcotics for pain control here with complaints of intermittent dysphagia to pills and solids. She is also concerned about left jaw pain radiating to her ear. She has prominent TMJ on the left that's slightly protruding out with point tenderness.  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 Advised patient to follow up with dental office, may need referral to oral surgeon  Solid and pill dysphagia: We will schedule for upper GI series to exclude any mechanical stricture or dysmotility. Consider EGD based on findings of upper GI series  Patient is also past due for recall colonoscopy   25 minutes was spent face-to-face with the patient. Greater than 50% of the time used for counseling as well as treatment plan and follow-up. She had  multiple questions which were answered to her satisfaction  K. Denzil Magnuson , MD [phone] Mon-Fri 8a-5p [phone] after 5p, weekends, holidays  CC: Stephens Shire, MD

## 2016-05-18 NOTE — Patient Instructions (Signed)
You have been scheduled for an Upper GI Series at [facility]. Your appointment is on 06/05/2016 at 10:30am. Please arrive 15 minutes prior to your test for registration. Make sure not to eat or drink anything after midnight on the night before your test. If you need to reschedule, please call radiology at [phone]. ________________________________________________________________ An upper GI series uses x rays to help diagnose problems of the upper GI tract, which includes the esophagus, stomach, and duodenum. The duodenum is the first part of the small intestine. An upper GI series is conducted by a radiology technologist or a radiologist-a doctor who specializes in x-ray imaging-at a hospital or outpatient center. While sitting or standing in front of an x-ray machine, the patient drinks barium liquid, which is often white and has a chalky consistency and taste. The barium liquid coats the lining of the upper GI tract and makes signs of disease show up more clearly on x rays. X-ray video, called fluoroscopy, is used to view the barium liquid moving through the esophagus, stomach, and duodenum. Additional x rays and fluoroscopy are performed while the patient lies on an x-ray table. To fully coat the upper GI tract with barium liquid, the technologist or radiologist may press on the abdomen or ask the patient to change position. Patients hold still in various positions, allowing the technologist or radiologist to take x rays of the upper GI tract at different angles. If a technologist conducts the upper GI series, a radiologist will later examine the images to look for problems.  This test typically takes about 1 hour to complete. __________________________________________________________________

## 2016-06-05 ENCOUNTER — Ambulatory Visit (HOSPITAL_COMMUNITY): Payer: BLUE CROSS/BLUE SHIELD

## 2016-06-15 ENCOUNTER — Ambulatory Visit (HOSPITAL_COMMUNITY)
Admission: RE | Admit: 2016-06-15 | Discharge: 2016-06-15 | Disposition: A | Discharge status: Home / Self Care | Payer: BLUE CROSS/BLUE SHIELD | Source: Ambulatory Visit | Attending: Gastroenterology | Admitting: Gastroenterology

## 2016-06-15 DIAGNOSIS — Z9884 Bariatric surgery status: Secondary | ICD-10-CM | POA: Insufficient documentation

## 2016-06-15 DIAGNOSIS — K222 Esophageal obstruction: Secondary | ICD-10-CM | POA: Diagnosis not present

## 2016-06-15 DIAGNOSIS — R1310 Dysphagia, unspecified: Secondary | ICD-10-CM | POA: Diagnosis present

## 2016-06-16 ENCOUNTER — Other Ambulatory Visit: Payer: Self-pay

## 2016-06-16 DIAGNOSIS — R1310 Dysphagia, unspecified: Secondary | ICD-10-CM

## 2016-06-16 DIAGNOSIS — K222 Esophageal obstruction: Secondary | ICD-10-CM

## 2016-06-16 DIAGNOSIS — R1319 Other dysphagia: Secondary | ICD-10-CM

## 2016-11-15 ENCOUNTER — Other Ambulatory Visit: Payer: Self-pay | Admitting: Gastroenterology

## 2016-11-15 DIAGNOSIS — K219 Gastro-esophageal reflux disease without esophagitis: Secondary | ICD-10-CM

## 2017-07-11 ENCOUNTER — Emergency Department (HOSPITAL_COMMUNITY): Payer: BLUE CROSS/BLUE SHIELD

## 2017-07-11 ENCOUNTER — Emergency Department (HOSPITAL_COMMUNITY)
Admission: EM | Admit: 2017-07-11 | Discharge: 2017-07-11 | Disposition: A | Discharge status: Home / Self Care | Payer: BLUE CROSS/BLUE SHIELD | Attending: Emergency Medicine | Admitting: Emergency Medicine

## 2017-07-11 ENCOUNTER — Encounter (HOSPITAL_COMMUNITY): Payer: Self-pay | Admitting: *Deleted

## 2017-07-11 DIAGNOSIS — M545 Low back pain, unspecified: Secondary | ICD-10-CM

## 2017-07-11 DIAGNOSIS — Z79899 Other long term (current) drug therapy: Secondary | ICD-10-CM | POA: Diagnosis not present

## 2017-07-11 DIAGNOSIS — M62830 Muscle spasm of back: Secondary | ICD-10-CM | POA: Diagnosis not present

## 2017-07-11 DIAGNOSIS — Z85841 Personal history of malignant neoplasm of brain: Secondary | ICD-10-CM | POA: Insufficient documentation

## 2017-07-11 DIAGNOSIS — R252 Cramp and spasm: Secondary | ICD-10-CM

## 2017-07-11 MED ORDER — OXYCODONE-ACETAMINOPHEN 5-325 MG PO TABS
1.0000 | ORAL_TABLET | Freq: Once | ORAL | Status: AC
  Administered 2017-07-11: 1 via ORAL
  Filled 2017-07-11: qty 1

## 2017-07-11 MED ORDER — NAPROXEN 500 MG PO TABS
500.0000 mg | ORAL_TABLET | Freq: Once | ORAL | Status: AC
  Administered 2017-07-11: 500 mg via ORAL
  Filled 2017-07-11: qty 1

## 2017-07-11 MED ORDER — NAPROXEN 375 MG PO TABS: 375.0000 mg | ORAL_TABLET | Freq: Two times a day (BID) | ORAL | 0 refills | Status: AC

## 2017-07-11 MED ORDER — METHOCARBAMOL 500 MG PO TABS: 500.0000 mg | ORAL_TABLET | Freq: Two times a day (BID) | ORAL | 0 refills | Status: AC

## 2017-07-11 MED ORDER — DIAZEPAM 2 MG PO TABS
2.0000 mg | ORAL_TABLET | Freq: Once | ORAL | Status: AC
  Administered 2017-07-11: 2 mg via ORAL
  Filled 2017-07-11: qty 1

## 2017-07-11 NOTE — ED Provider Notes (Signed)
[place] DEPT Provider Note   CSN: [id number] Arrival date & time: 07/11/17  1239     History   Chief Complaint Chief Complaint  Patient presents with  . Back Pain    HPI Theresa Morrow is a 62 y.o. female.  HPI 62 year old female comes in with chief complaint of back pain.  Patient has history of anemia, choledocholithiasis and osteoarthritis. Patient states that she started having back pain yesterday and now she has difficulty in walking around.  Patient denies any trauma.  She has no history of back pain to that extent and she denies any associated numbness, tingling, saddle anesthesia, lower extremity weakness, urinary incontinence or retention.  Past Medical History:  Diagnosis Date  . Anemia   . Bile duct stone    liver stones  . Cancer [facility])    brain tumor 2007  . Cholangitis   . Choledocholithiasis   . Chronic headaches   . Complication of anesthesia    low heart rate  . GERD (gastroesophageal reflux disease)   . Hepatomegaly   . Kidney stones   . Obesity   . Osteoarthritis     Patient Active Problem List   Diagnosis Date Noted  . Esophageal reflux 08/15/2013  . Reflux esophagitis 08/15/2013    Past Surgical History:  Procedure Laterality Date  . ACOUSTIC NEUROMA RESECTION  2007  . CHOLECYSTECTOMY OPEN  1977  . ESOPHAGOGASTRODUODENOSCOPY N/A 08/15/2013   Procedure: ESOPHAGOGASTRODUODENOSCOPY (EGD);  Surgeon: Lafayette Dragon, MD;  Location: [facility];  Service: Endoscopy;  Laterality: N/A;  . KNEE ARTHROSCOPY Left 2004  . ROUX-EN-Y GASTRIC BYPASS  2011   and removed liver stones     OB History   None      Home Medications    Prior to Admission medications   Medication Sig Start Date End Date Taking? Authorizing Provider  calcium carbonate (TUMS - DOSED IN MG ELEMENTAL CALCIUM) 500 MG chewable tablet Chew 1 tablet by mouth as needed for indigestion or heartburn.   Yes [provider]  Cholecalciferol  (VITAMIN D PO) Take 1 tablet by mouth daily.   Yes [provider]  HYDROcodone-acetaminophen (NORCO) 10-325 MG tablet Take 1 tablet by mouth every 6 (six) hours as needed for moderate pain.    Yes [provider]  Multiple Vitamin (MULTI-VITAMINS) TABS Take 1 tablet by mouth daily.   Yes [provider]  pantoprazole (PROTONIX) 40 MG tablet TAKE 1 TABLET(40 MG) BY MOUTH DAILY 11/16/16  Yes Nandigam, Venia Minks, MD  methocarbamol (ROBAXIN) 500 MG tablet Take 1 tablet (500 mg total) by mouth 2 (two) times daily. 07/11/17   Varney Biles, MD  naproxen (NAPROSYN) 375 MG tablet Take 1 tablet (375 mg total) by mouth 2 (two) times daily. 07/11/17   Varney Biles, MD    Family History Family History  Problem Relation Age of Onset  . Heart disease Mother   . Gallbladder disease Mother   . Diabetes Mother   . Gallbladder disease Sister   . Gallbladder disease Maternal Grandmother   . Kidney cancer Father   . Colon cancer Neg Hx     Social History Social History   Tobacco Use  . Smoking status: Never Smoker  . Smokeless tobacco: Never Used  Substance Use Topics  . Alcohol use: No  . Drug use: No     Allergies   Patient has no known allergies.   Review of Systems Review of Systems  Constitutional: Positive for  activity change.  Musculoskeletal: Positive for back pain.  Neurological: Negative for numbness.  Hematological: Does not bruise/bleed easily.     Physical Exam Updated Vital Signs BP (!) 90/55   Pulse 76   Temp 98.4 F (36.9 C) (Oral)   Resp 16   SpO2 97%   Physical Exam  Constitutional: She is oriented to person, place, and time. She appears well-developed.  HENT:  Head: Normocephalic and atraumatic.  Eyes: EOM are normal.  Neck: Normal range of motion. Neck supple.  Cardiovascular: Normal rate.  Pulmonary/Chest: Effort normal.  Abdominal: Bowel sounds are normal.  Musculoskeletal:  Pt has tenderness over the lumbar region No step  offs, no erythema. Pt has 2+ patellar reflex bilaterally. Able to discriminate between sharp and dull. Able to ambulate  Patient has large paraspinal spasms, tenderness gets worse with palpation the.  Neurological: She is alert and oriented to person, place, and time.  Skin: Skin is warm and dry.  Nursing note and vitals reviewed.    ED Treatments / Results  Labs (all labs ordered are listed, but only abnormal results are displayed) Labs Reviewed - No data to display  EKG None  Radiology Dg Lumbar Spine Complete  Result Date: 07/11/2017 CLINICAL DATA:  Low back pain into hips for 2 days, fell 1 week ago EXAM: LUMBAR SPINE - COMPLETE 4+ VIEW COMPARISON:  CT abdomen and pelvis 11/17/2010 FINDINGS: Osseous demineralization. Five non-rib-bearing lumbar vertebra. Minimal biconvex thoracolumbar scoliosis. Multilevel degenerative disc disease changes with disc space narrowing and endplate spur formation. Facet degenerative changes lower lumbar spine. Vertebral body heights maintained without fracture or bone destruction. Minimal anterolisthesis at L4-L5 and retrolisthesis at L2-L3 noted. No definite spondylolysis. SI joints preserved. IMPRESSION: Osseous demineralization with degenerative disc and facet disease changes of the lumbar spine with associated biconvex thoracolumbar scoliosis and minimal listhesis. No acute abnormalities. Electronically Signed   By: Lavonia Dana M.D.   On: 07/11/2017 15:05    Procedures Procedures (including critical care time)  Medications Ordered in ED Medications  oxyCODONE-acetaminophen (PERCOCET/ROXICET) 5-325 MG per tablet 1 tablet (1 tablet Oral Given 07/11/17 1419)  diazepam (VALIUM) tablet 2 mg (2 mg Oral Given 07/11/17 1423)  naproxen (NAPROSYN) tablet 500 mg (500 mg Oral Given 07/11/17 1419)     Initial Impression / Assessment and Plan / ED Course  I have reviewed the triage vital signs and the nursing notes.  Pertinent labs & imaging results that were  available during my care of the patient were reviewed by me and considered in my medical decision making (see chart for details).     62 year old comes in with chief complaint of back pain.  Patient back pain is new and there is no specific trauma associated with this.  I do appreciate spasm on my exam, however given her age and new onset pain that cannot be explained we will get an x-ray to ensure there is no lytic lesions or pathologic fracture.  Final Clinical Impressions(s) / ED Diagnoses   Final diagnoses:  Acute midline low back pain without sciatica  Spasm    ED Discharge Orders        Ordered    naproxen (NAPROSYN) 375 MG tablet  2 times daily     07/11/17 1634    methocarbamol (ROBAXIN) 500 MG tablet  2 times daily     07/11/17 1634       Varney Biles, MD 07/11/17 1646

## 2017-07-11 NOTE — ED Triage Notes (Signed)
Pt complains of back since yesterday. Pt denies injury to her back. Pt denies urinary symptoms.

## 2017-07-11 NOTE — Discharge Instructions (Addendum)
The x-ray in the ER does not show any concerning finding.  We suspect that the back pain you are having is due to muscle spasm and lumbar strain.  Please stretch the back and perform the exercises provided.  Continue with activity as much as he can tolerate. See your doctor in 1 week.  Some people might benefit with deep tissue massage or with physical therapy.

## 2017-07-14 ENCOUNTER — Encounter (HOSPITAL_COMMUNITY): Payer: Self-pay | Admitting: Nurse Practitioner

## 2017-07-14 ENCOUNTER — Emergency Department (HOSPITAL_COMMUNITY): Payer: BLUE CROSS/BLUE SHIELD

## 2017-07-14 ENCOUNTER — Emergency Department (HOSPITAL_COMMUNITY)
Admission: EM | Admit: 2017-07-14 | Discharge: 2017-07-14 | Disposition: A | Discharge status: Home / Self Care | Payer: BLUE CROSS/BLUE SHIELD | Attending: Emergency Medicine | Admitting: Emergency Medicine

## 2017-07-14 DIAGNOSIS — M79661 Pain in right lower leg: Secondary | ICD-10-CM | POA: Diagnosis not present

## 2017-07-14 DIAGNOSIS — Z79899 Other long term (current) drug therapy: Secondary | ICD-10-CM | POA: Diagnosis not present

## 2017-07-14 DIAGNOSIS — Z85841 Personal history of malignant neoplasm of brain: Secondary | ICD-10-CM | POA: Diagnosis not present

## 2017-07-14 DIAGNOSIS — M545 Low back pain, unspecified: Secondary | ICD-10-CM

## 2017-07-14 DIAGNOSIS — M79662 Pain in left lower leg: Secondary | ICD-10-CM | POA: Diagnosis not present

## 2017-07-14 DIAGNOSIS — M79604 Pain in right leg: Secondary | ICD-10-CM

## 2017-07-14 DIAGNOSIS — M79605 Pain in left leg: Secondary | ICD-10-CM

## 2017-07-14 LAB — CBC WITH DIFFERENTIAL/PLATELET
BASOS PCT: 1 %
Basophils Absolute: 0 10*3/uL (ref 0.0–0.1)
Eosinophils Absolute: 0 10*3/uL (ref 0.0–0.7)
Eosinophils Relative: 1 %
HEMATOCRIT: 38.9 % (ref 36.0–46.0)
Hemoglobin: 12.6 g/dL (ref 12.0–15.0)
LYMPHS PCT: 24 %
Lymphs Abs: 0.5 10*3/uL — ABNORMAL LOW (ref 0.7–4.0)
MCH: 28.6 pg (ref 26.0–34.0)
MCHC: 32.4 g/dL (ref 30.0–36.0)
MCV: 88.2 fL (ref 78.0–100.0)
MONO ABS: 0.5 10*3/uL (ref 0.1–1.0)
Monocytes Relative: 24 %
NEUTROS PCT: 50 %
Neutro Abs: 1.2 10*3/uL — ABNORMAL LOW (ref 1.7–7.7)
Platelets: 119 10*3/uL — ABNORMAL LOW (ref 150–400)
RBC: 4.41 MIL/uL (ref 3.87–5.11)
RDW: 15.8 % — ABNORMAL HIGH (ref 11.5–15.5)
WBC: 2.2 10*3/uL — ABNORMAL LOW (ref 4.0–10.5)

## 2017-07-14 LAB — URINALYSIS, ROUTINE W REFLEX MICROSCOPIC
BACTERIA UA: NONE SEEN
BILIRUBIN URINE: NEGATIVE
Glucose, UA: NEGATIVE mg/dL
HGB URINE DIPSTICK: NEGATIVE
Ketones, ur: NEGATIVE mg/dL
Nitrite: NEGATIVE
PH: 5 (ref 5.0–8.0)
Protein, ur: 30 mg/dL — AB
SPECIFIC GRAVITY, URINE: 1.026 (ref 1.005–1.030)

## 2017-07-14 LAB — BASIC METABOLIC PANEL
Anion gap: 11 (ref 5–15)
BUN: 40 mg/dL — ABNORMAL HIGH (ref 6–20)
CHLORIDE: 109 mmol/L (ref 101–111)
CO2: 21 mmol/L — AB (ref 22–32)
CREATININE: 1.4 mg/dL — AB (ref 0.44–1.00)
Calcium: 8.5 mg/dL — ABNORMAL LOW (ref 8.9–10.3)
GFR calc non Af Amer: 39 mL/min — ABNORMAL LOW (ref >60)
GFR, EST AFRICAN AMERICAN: 46 mL/min — AB (ref >60)
GLUCOSE: 94 mg/dL (ref 65–99)
Potassium: 3.7 mmol/L (ref 3.5–5.1)
Sodium: 141 mmol/L (ref 135–145)

## 2017-07-14 MED ORDER — METHOCARBAMOL 500 MG PO TABS
500.0000 mg | ORAL_TABLET | Freq: Once | ORAL | Status: AC
  Administered 2017-07-14: 500 mg via ORAL
  Filled 2017-07-14: qty 1

## 2017-07-14 MED ORDER — CEPHALEXIN 500 MG PO CAPS: 500.0000 mg | ORAL_CAPSULE | Freq: Four times a day (QID) | ORAL | 0 refills | Status: AC

## 2017-07-14 MED ORDER — OXYCODONE-ACETAMINOPHEN 5-325 MG PO TABS: 1.0000 | ORAL_TABLET | ORAL | 0 refills | Status: AC | PRN

## 2017-07-14 MED ORDER — OXYCODONE-ACETAMINOPHEN 5-325 MG PO TABS
1.0000 | ORAL_TABLET | Freq: Once | ORAL | Status: AC
  Administered 2017-07-14: 1 via ORAL
  Filled 2017-07-14: qty 1

## 2017-07-14 NOTE — ED Triage Notes (Signed)
Pt is c/o bilateral leg pain with associated leg swelling. Onset 2 days ago.

## 2017-07-14 NOTE — Discharge Instructions (Addendum)
Please come back for an ultrasound of your legs tomorrow morning  If tomorrow is a Saturday, Sunday or holiday, please go to the [facility] Emergency Department Registration Desk at 8 am tomorrow morning and tell them you are there for a vascular study.  If tomorrow is a weekday (Monday-Friday), please go to [facility] Admitting Department at 8 am and tell them you are there for a vascular study.  Please make an appointment with your doctor to recheck your kidney function Take Keflex 4 times daily for the next 5 days

## 2017-07-14 NOTE — ED Provider Notes (Signed)
[place] DEPT Provider Note   CSN: [id number] Arrival date & time: 07/14/17  1740   History   Chief Complaint Chief Complaint  Patient presents with  . Leg Pain    HPI Theresa Morrow is a 62 y.o. female who presents with back pain and bilateral leg pain. PMH significant for chronic hip and knee pain on Norco, hx of brain tumor, GERD, hx of PUD. She states that about 4 days ago she developed severe low back pain. She is unsure why because there were no inciting factors. She did have a fall a couple weeks ago but didn't have significant pain after that. She was seen in the ED three days ago for her back. Lumbar xray was obtained and which showed mild scoliosis and multi-level degenerative disc disease. She was prescribed Naproxen and Robaxin which has improved her back pain some but it is still present. She has been very inactive since her back started hurting and has been mostly lying in bed. Yesterday she started to develop bilateral leg pain. It started in the ankles and is now in the bilateral shins. She reports associated swelling but has had this in the past. She has also noted bruising and redness of her legs. No fever, unexplained weight loss, loss of bowel/bladder function, saddle anesthesia, urinary retention, IVDU. No hx of DVT. No chest pain or SOB.   HPI  Past Medical History:  Diagnosis Date  . Anemia   . Bile duct stone    liver stones  . Cancer [facility])    brain tumor 2007  . Cholangitis   . Choledocholithiasis   . Chronic headaches   . Complication of anesthesia    low heart rate  . GERD (gastroesophageal reflux disease)   . Hepatomegaly   . Kidney stones   . Obesity   . Osteoarthritis     Patient Active Problem List   Diagnosis Date Noted  . Esophageal reflux 08/15/2013  . Reflux esophagitis 08/15/2013    Past Surgical History:  Procedure Laterality Date  . ACOUSTIC NEUROMA RESECTION  2007  . CHOLECYSTECTOMY OPEN  1977  .  ESOPHAGOGASTRODUODENOSCOPY N/A 08/15/2013   Procedure: ESOPHAGOGASTRODUODENOSCOPY (EGD);  Surgeon: Lafayette Dragon, MD;  Location: [facility];  Service: Endoscopy;  Laterality: N/A;  . KNEE ARTHROSCOPY Left 2004  . ROUX-EN-Y GASTRIC BYPASS  2011   and removed liver stones     OB History   None      Home Medications    Prior to Admission medications   Medication Sig Start Date End Date Taking? Authorizing Provider  calcium carbonate (TUMS - DOSED IN MG ELEMENTAL CALCIUM) 500 MG chewable tablet Chew 1 tablet by mouth as needed for indigestion or heartburn.    [provider]  Cholecalciferol (VITAMIN D PO) Take 1 tablet by mouth daily.    [provider]  HYDROcodone-acetaminophen (NORCO) 10-325 MG tablet Take 1 tablet by mouth every 6 (six) hours as needed for moderate pain.     [provider]  methocarbamol (ROBAXIN) 500 MG tablet Take 1 tablet (500 mg total) by mouth 2 (two) times daily. 07/11/17   Varney Biles, MD  Multiple Vitamin (MULTI-VITAMINS) TABS Take 1 tablet by mouth daily.    [provider]  naproxen (NAPROSYN) 375 MG tablet Take 1 tablet (375 mg total) by mouth 2 (two) times daily. 07/11/17   Varney Biles, MD  pantoprazole (PROTONIX) 40 MG tablet TAKE 1 TABLET(40 MG) BY MOUTH DAILY 11/16/16  Mauri Pole, MD    Family History Family History  Problem Relation Age of Onset  . Heart disease Mother   . Gallbladder disease Mother   . Diabetes Mother   . Gallbladder disease Sister   . Gallbladder disease Maternal Grandmother   . Kidney cancer Father   . Colon cancer Neg Hx     Social History Social History   Tobacco Use  . Smoking status: Never Smoker  . Smokeless tobacco: Never Used  Substance Use Topics  . Alcohol use: No  . Drug use: No     Allergies   Patient has no known allergies.   Review of Systems Review of Systems  Constitutional: Negative for fever.  Respiratory: Negative for shortness of breath.     Cardiovascular: Positive for leg swelling. Negative for chest pain.  Musculoskeletal: Positive for arthralgias, back pain, joint swelling and myalgias.  Hematological: Does not bruise/bleed easily.  All other systems reviewed and are negative.    Physical Exam Updated Vital Signs BP 109/67 (BP Location: Left Arm)   Pulse (!) 105   Temp 98.4 F (36.9 C) (Oral)   Resp 14   SpO2 97%   Physical Exam  Constitutional: She is oriented to person, place, and time. She appears well-developed and well-nourished. No distress.  Cooperative. Uncomfortable appearing  HENT:  Head: Normocephalic and atraumatic.  Eyes: Pupils are equal, round, and reactive to light. Conjunctivae are normal. Right eye exhibits no discharge. Left eye exhibits no discharge. No scleral icterus.  Neck: Normal range of motion.  Cardiovascular: Normal rate.  Pulmonary/Chest: Effort normal. No respiratory distress.  Abdominal: She exhibits no distension.  Musculoskeletal: She exhibits edema (Bilateral peripheral non-pitting edema. Multiple varicose veins. Mild erythema over left ankle. Tenderness with palpation of feet, ankles, calves. 2+ DP pulse bilaterally).  Back: Inspection: No masses or rash. Mild deformity due to scoliosis Palpation: Diffuse low back tenderness Strength: 5/5 in lower extremities and normal plantar and dorsiflexion Reflexes: Patellar reflex is 2+ bilaterally SLR: Negative seated straight leg raise Gait: Normal gait   Neurological: She is alert and oriented to person, place, and time.  Skin: Skin is warm and dry.  Psychiatric: She has a normal mood and affect. Her behavior is normal.  Nursing note and vitals reviewed.    ED Treatments / Results  Labs (all labs ordered are listed, but only abnormal results are displayed) Labs Reviewed  BASIC METABOLIC PANEL - Abnormal; Notable for the following components:      Result Value   CO2 21 (*)    BUN 40 (*)    Creatinine, Ser 1.40 (*)     Calcium 8.5 (*)    GFR calc non Af Amer 39 (*)    GFR calc Af Amer 46 (*)    All other components within normal limits  CBC WITH DIFFERENTIAL/PLATELET - Abnormal; Notable for the following components:   WBC 2.2 (*)    RDW 15.8 (*)    Platelets 119 (*)    Neutro Abs 1.2 (*)    Lymphs Abs 0.5 (*)    All other components within normal limits  URINALYSIS, ROUTINE W REFLEX MICROSCOPIC - Abnormal; Notable for the following components:   Color, Urine AMBER (*)    APPearance HAZY (*)    Protein, ur 30 (*)    Leukocytes, UA SMALL (*)    Non Squamous Epithelial 0-5 (*)    All other components within normal limits    EKG None  Radiology Ct  Lumbar Spine Wo Contrast  Result Date: 07/14/2017 CLINICAL DATA:  Initial evaluation for low back pain with bilateral leg pain. EXAM: CT LUMBAR SPINE WITHOUT CONTRAST TECHNIQUE: Multidetector CT imaging of the lumbar spine was performed without intravenous contrast administration. Multiplanar CT image reconstructions were also generated. COMPARISON:  Prior radiograph 07/11/2017. FINDINGS: Segmentation: Normal segmentation. Lowest well-formed disc labeled the L5-S1 level. Alignment: 3 mm retrolisthesis of L2 on L3, with up to 4 mm retrolisthesis of L4 on L5. Findings are likely chronic and facet mediated. Mild levoscoliosis, apex at L3. Vertebrae: Vertebral body heights maintained without evidence for acute or chronic fracture. Chronic reactive endplate changes present about the right aspect of the L3-4 interspace due to scoliotic curvature. No discrete lytic or blastic osseous lesions. Visualized sacrum and pelvis unremarkable. Paraspinal and other soft tissues: No acute paraspinous soft tissue abnormality. Patient status post gastric bypass. Punctate nonobstructive left renal nephrolithiasis. Disc levels: L1-2: Mild diffuse disc bulge, asymmetric to the right. Mild bilateral facet hypertrophy. No significant canal stenosis. Mild right L1 foraminal narrowing. L2-3:  Retrolisthesis. Chronic intervertebral disc space narrowing with disc bulge. Mild facet hypertrophy. Resultant mild canal with moderate bilateral L2 foraminal stenosis. L3-4: Chronic intervertebral disc space narrowing with diffuse disc bulge. Associated disc desiccation noted. Moderate facet hypertrophy. Resultant moderate canal with bilateral lateral recess narrowing. Severe right with moderate left L3 foraminal stenosis. L4-5: Anterolisthesis. Diffuse disc bulge, slightly asymmetric to the right. Severe facet arthropathy. Resultant severe canal with bilateral subarticular stenosis. Moderate bilateral L4 foraminal narrowing, slightly worse on the left. L5-S1: Diffuse disc bulge with intervertebral disc space narrowing. Severe left with moderate right facet arthrosis. Resultant mild left lateral recess narrowing without significant canal stenosis. Moderate left L5 foraminal narrowing. No significant right foraminal encroachment. IMPRESSION: 1. No acute abnormality within the lumbar spine. 2. Levoscoliosis with multilevel degenerative spondylolysis with resultant multilevel canal narrowing as above, moderate at L3-4 and severe at L4-5. 3. Multifactorial degenerative changes with resultant multilevel foraminal narrowing as above. 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 4. Punctate nonobstructive left renal nephrolithiasis. Electronically Signed   By: Jeannine Boga M.D.   On: 07/14/2017 20:24    Procedures Procedures (including critical care time)  Medications Ordered in ED Medications  oxyCODONE-acetaminophen (PERCOCET/ROXICET) 5-325 MG per tablet 1 tablet (1 tablet Oral Given 07/14/17 1938)  methocarbamol (ROBAXIN) tablet 500 mg (500 mg Oral Given 07/14/17 1938)     Initial Impression / Assessment and Plan / ED Course  I have reviewed  the triage vital signs and the nursing notes.  Pertinent labs & imaging results that were available during my care of the patient were reviewed by me and considered in my medical decision making (see chart for details).  62 year old female with acute onset of bilateral leg pain and swelling. Unclear etiology. She is mildly tachycardic in triage. This has resolved on recheck. She is otherwise well appearing. She has diffuse low back pain. She states this is better from a couple days ago. She has 2+ non-pitting edema of her bilateral lower extremities with diffuse pain. Will obtain labs, CT spine. DVT study unable to be obtained since it is after 7PM.  9:29 PM She states the pain medicine helped with her back but not her legs. 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 BMP is remarkable for AKI. Her most recent  SCr was .8 in 2016. UA shows small leukocytes and 30 protein. Shared visit with Dr. Wilson Singer. Will initiate Keflex for possible cellulitis and have her come back for a DVT study in the morning. Also advised f/u with her PCP to have her blood work rechecked.   Final Clinical Impressions(s) / ED Diagnoses   Final diagnoses:  Bilateral leg pain  Acute bilateral low back pain without sciatica    ED Discharge Orders    None       Recardo Evangelist, PA-C 07/14/17 2320    Virgel Manifold, MD 07/17/17 1109

## 2017-07-15 ENCOUNTER — Ambulatory Visit (HOSPITAL_BASED_OUTPATIENT_CLINIC_OR_DEPARTMENT_OTHER)
Admission: RE | Admit: 2017-07-15 | Discharge: 2017-07-15 | Disposition: A | Discharge status: Home / Self Care | Payer: BLUE CROSS/BLUE SHIELD | Source: Ambulatory Visit

## 2017-07-15 DIAGNOSIS — M79609 Pain in unspecified limb: Secondary | ICD-10-CM

## 2017-07-15 DIAGNOSIS — M7989 Other specified soft tissue disorders: Secondary | ICD-10-CM | POA: Diagnosis not present

## 2017-07-15 DIAGNOSIS — E872 Acidosis: Secondary | ICD-10-CM | POA: Diagnosis not present

## 2017-07-15 DIAGNOSIS — A4102 Sepsis due to Methicillin resistant Staphylococcus aureus: Secondary | ICD-10-CM | POA: Diagnosis not present

## 2017-07-15 NOTE — Progress Notes (Signed)
VASCULAR LAB PRELIMINARY  PRELIMINARY  PRELIMINARY  PRELIMINARY  Bilateral lower extremity venous duplex completed.    Preliminary report:  There is no DVT or SVT noted in the bilateral lower extremities.   Mell Guia, RVT 07/15/2017, 10:08 AM

## 2017-07-16 ENCOUNTER — Inpatient Hospital Stay (HOSPITAL_COMMUNITY)
Admission: EM | Admit: 2017-07-16 | Discharge: 2017-08-11 | DRG: 871 | Disposition: E | Payer: BLUE CROSS/BLUE SHIELD | Attending: Pediatrics | Admitting: Pediatrics

## 2017-07-16 ENCOUNTER — Inpatient Hospital Stay (HOSPITAL_COMMUNITY): Payer: BLUE CROSS/BLUE SHIELD

## 2017-07-16 ENCOUNTER — Emergency Department (HOSPITAL_COMMUNITY): Payer: BLUE CROSS/BLUE SHIELD

## 2017-07-16 ENCOUNTER — Other Ambulatory Visit: Payer: Self-pay

## 2017-07-16 ENCOUNTER — Encounter (HOSPITAL_COMMUNITY): Payer: Self-pay

## 2017-07-16 DIAGNOSIS — Z791 Long term (current) use of non-steroidal anti-inflammatories (NSAID): Secondary | ICD-10-CM

## 2017-07-16 DIAGNOSIS — Y92230 Patient room in hospital as the place of occurrence of the external cause: Secondary | ICD-10-CM | POA: Diagnosis not present

## 2017-07-16 DIAGNOSIS — R7881 Bacteremia: Secondary | ICD-10-CM | POA: Diagnosis not present

## 2017-07-16 DIAGNOSIS — I480 Paroxysmal atrial fibrillation: Secondary | ICD-10-CM | POA: Diagnosis present

## 2017-07-16 DIAGNOSIS — L03115 Cellulitis of right lower limb: Secondary | ICD-10-CM | POA: Diagnosis present

## 2017-07-16 DIAGNOSIS — I517 Cardiomegaly: Secondary | ICD-10-CM | POA: Diagnosis present

## 2017-07-16 DIAGNOSIS — R441 Visual hallucinations: Secondary | ICD-10-CM | POA: Diagnosis present

## 2017-07-16 DIAGNOSIS — Z9884 Bariatric surgery status: Secondary | ICD-10-CM

## 2017-07-16 DIAGNOSIS — E872 Acidosis, unspecified: Secondary | ICD-10-CM

## 2017-07-16 DIAGNOSIS — Z6828 Body mass index (BMI) 28.0-28.9, adult: Secondary | ICD-10-CM | POA: Diagnosis not present

## 2017-07-16 DIAGNOSIS — Z9049 Acquired absence of other specified parts of digestive tract: Secondary | ICD-10-CM

## 2017-07-16 DIAGNOSIS — J9601 Acute respiratory failure with hypoxia: Secondary | ICD-10-CM | POA: Diagnosis not present

## 2017-07-16 DIAGNOSIS — M545 Low back pain, unspecified: Secondary | ICD-10-CM

## 2017-07-16 DIAGNOSIS — B953 Streptococcus pneumoniae as the cause of diseases classified elsewhere: Secondary | ICD-10-CM | POA: Diagnosis not present

## 2017-07-16 DIAGNOSIS — Z79899 Other long term (current) drug therapy: Secondary | ICD-10-CM

## 2017-07-16 DIAGNOSIS — G9341 Metabolic encephalopathy: Secondary | ICD-10-CM | POA: Diagnosis not present

## 2017-07-16 DIAGNOSIS — I4891 Unspecified atrial fibrillation: Secondary | ICD-10-CM

## 2017-07-16 DIAGNOSIS — Z515 Encounter for palliative care: Secondary | ICD-10-CM | POA: Diagnosis not present

## 2017-07-16 DIAGNOSIS — B9562 Methicillin resistant Staphylococcus aureus infection as the cause of diseases classified elsewhere: Secondary | ICD-10-CM | POA: Diagnosis not present

## 2017-07-16 DIAGNOSIS — R5082 Postprocedural fever: Secondary | ICD-10-CM | POA: Diagnosis not present

## 2017-07-16 DIAGNOSIS — M25532 Pain in left wrist: Secondary | ICD-10-CM | POA: Diagnosis present

## 2017-07-16 DIAGNOSIS — N179 Acute kidney failure, unspecified: Secondary | ICD-10-CM | POA: Diagnosis not present

## 2017-07-16 DIAGNOSIS — M25571 Pain in right ankle and joints of right foot: Secondary | ICD-10-CM | POA: Diagnosis not present

## 2017-07-16 DIAGNOSIS — R400 Somnolence: Secondary | ICD-10-CM | POA: Diagnosis not present

## 2017-07-16 DIAGNOSIS — G4710 Hypersomnia, unspecified: Secondary | ICD-10-CM | POA: Diagnosis not present

## 2017-07-16 DIAGNOSIS — G935 Compression of brain: Secondary | ICD-10-CM | POA: Diagnosis not present

## 2017-07-16 DIAGNOSIS — E162 Hypoglycemia, unspecified: Secondary | ICD-10-CM | POA: Diagnosis present

## 2017-07-16 DIAGNOSIS — Z833 Family history of diabetes mellitus: Secondary | ICD-10-CM

## 2017-07-16 DIAGNOSIS — I481 Persistent atrial fibrillation: Secondary | ICD-10-CM | POA: Diagnosis present

## 2017-07-16 DIAGNOSIS — I615 Nontraumatic intracerebral hemorrhage, intraventricular: Secondary | ICD-10-CM | POA: Diagnosis not present

## 2017-07-16 DIAGNOSIS — R8271 Bacteriuria: Secondary | ICD-10-CM | POA: Diagnosis not present

## 2017-07-16 DIAGNOSIS — G936 Cerebral edema: Secondary | ICD-10-CM | POA: Diagnosis not present

## 2017-07-16 DIAGNOSIS — Z789 Other specified health status: Secondary | ICD-10-CM

## 2017-07-16 DIAGNOSIS — E669 Obesity, unspecified: Secondary | ICD-10-CM | POA: Diagnosis present

## 2017-07-16 DIAGNOSIS — Z9889 Other specified postprocedural states: Secondary | ICD-10-CM | POA: Diagnosis not present

## 2017-07-16 DIAGNOSIS — A491 Streptococcal infection, unspecified site: Secondary | ICD-10-CM

## 2017-07-16 DIAGNOSIS — Z8051 Family history of malignant neoplasm of kidney: Secondary | ICD-10-CM

## 2017-07-16 DIAGNOSIS — J13 Pneumonia due to Streptococcus pneumoniae: Secondary | ICD-10-CM | POA: Diagnosis present

## 2017-07-16 DIAGNOSIS — M25572 Pain in left ankle and joints of left foot: Secondary | ICD-10-CM | POA: Diagnosis not present

## 2017-07-16 DIAGNOSIS — R509 Fever, unspecified: Secondary | ICD-10-CM

## 2017-07-16 DIAGNOSIS — R402434 Glasgow coma scale score 3-8, 24 hours or more after hospital admission: Secondary | ICD-10-CM | POA: Diagnosis not present

## 2017-07-16 DIAGNOSIS — N390 Urinary tract infection, site not specified: Secondary | ICD-10-CM | POA: Diagnosis present

## 2017-07-16 DIAGNOSIS — N170 Acute kidney failure with tubular necrosis: Secondary | ICD-10-CM | POA: Diagnosis present

## 2017-07-16 DIAGNOSIS — Z66 Do not resuscitate: Secondary | ICD-10-CM | POA: Diagnosis not present

## 2017-07-16 DIAGNOSIS — I619 Nontraumatic intracerebral hemorrhage, unspecified: Secondary | ICD-10-CM | POA: Diagnosis not present

## 2017-07-16 DIAGNOSIS — R6521 Severe sepsis with septic shock: Secondary | ICD-10-CM | POA: Diagnosis present

## 2017-07-16 DIAGNOSIS — I340 Nonrheumatic mitral (valve) insufficiency: Secondary | ICD-10-CM | POA: Diagnosis not present

## 2017-07-16 DIAGNOSIS — R410 Disorientation, unspecified: Secondary | ICD-10-CM | POA: Diagnosis not present

## 2017-07-16 DIAGNOSIS — A4102 Sepsis due to Methicillin resistant Staphylococcus aureus: Secondary | ICD-10-CM | POA: Diagnosis present

## 2017-07-16 DIAGNOSIS — R51 Headache: Secondary | ICD-10-CM | POA: Diagnosis present

## 2017-07-16 DIAGNOSIS — K210 Gastro-esophageal reflux disease with esophagitis: Secondary | ICD-10-CM | POA: Diagnosis present

## 2017-07-16 DIAGNOSIS — Z8711 Personal history of peptic ulcer disease: Secondary | ICD-10-CM

## 2017-07-16 DIAGNOSIS — L03116 Cellulitis of left lower limb: Secondary | ICD-10-CM | POA: Diagnosis present

## 2017-07-16 DIAGNOSIS — Z87442 Personal history of urinary calculi: Secondary | ICD-10-CM

## 2017-07-16 DIAGNOSIS — M25531 Pain in right wrist: Secondary | ICD-10-CM | POA: Diagnosis present

## 2017-07-16 DIAGNOSIS — M160 Bilateral primary osteoarthritis of hip: Secondary | ICD-10-CM | POA: Diagnosis present

## 2017-07-16 DIAGNOSIS — T461X5A Adverse effect of calcium-channel blockers, initial encounter: Secondary | ICD-10-CM | POA: Diagnosis not present

## 2017-07-16 DIAGNOSIS — J189 Pneumonia, unspecified organism: Secondary | ICD-10-CM | POA: Diagnosis not present

## 2017-07-16 DIAGNOSIS — N200 Calculus of kidney: Secondary | ICD-10-CM | POA: Diagnosis present

## 2017-07-16 DIAGNOSIS — M549 Dorsalgia, unspecified: Secondary | ICD-10-CM | POA: Diagnosis not present

## 2017-07-16 DIAGNOSIS — R109 Unspecified abdominal pain: Secondary | ICD-10-CM | POA: Diagnosis not present

## 2017-07-16 DIAGNOSIS — A419 Sepsis, unspecified organism: Secondary | ICD-10-CM | POA: Diagnosis not present

## 2017-07-16 DIAGNOSIS — Z79891 Long term (current) use of opiate analgesic: Secondary | ICD-10-CM

## 2017-07-16 DIAGNOSIS — M170 Bilateral primary osteoarthritis of knee: Secondary | ICD-10-CM | POA: Diagnosis present

## 2017-07-16 DIAGNOSIS — G8929 Other chronic pain: Secondary | ICD-10-CM | POA: Diagnosis present

## 2017-07-16 DIAGNOSIS — D649 Anemia, unspecified: Secondary | ICD-10-CM | POA: Diagnosis present

## 2017-07-16 DIAGNOSIS — I351 Nonrheumatic aortic (valve) insufficiency: Secondary | ICD-10-CM | POA: Diagnosis not present

## 2017-07-16 DIAGNOSIS — L539 Erythematous condition, unspecified: Secondary | ICD-10-CM | POA: Diagnosis not present

## 2017-07-16 DIAGNOSIS — Z8249 Family history of ischemic heart disease and other diseases of the circulatory system: Secondary | ICD-10-CM

## 2017-07-16 LAB — COMPREHENSIVE METABOLIC PANEL
ALBUMIN: 2.7 g/dL — AB (ref 3.5–5.0)
ALBUMIN: 2.1 g/dL — AB (ref 3.5–5.0)
ALK PHOS: 161 U/L — AB (ref 38–126)
ALT: 39 U/L (ref 14–54)
ALT: 32 U/L (ref 14–54)
AST: 72 U/L — ABNORMAL HIGH (ref 15–41)
AST: 49 U/L — AB (ref 15–41)
Alkaline Phosphatase: 129 U/L — ABNORMAL HIGH (ref 38–126)
Anion gap: 16 — ABNORMAL HIGH (ref 5–15)
Anion gap: 10 (ref 5–15)
BUN: 63 mg/dL — ABNORMAL HIGH (ref 6–20)
BUN: 56 mg/dL — AB (ref 6–20)
CHLORIDE: 104 mmol/L (ref 101–111)
CO2: 18 mmol/L — AB (ref 22–32)
CO2: 17 mmol/L — ABNORMAL LOW (ref 22–32)
CREATININE: 2.56 mg/dL — AB (ref 0.44–1.00)
Calcium: 8.5 mg/dL — ABNORMAL LOW (ref 8.9–10.3)
Calcium: 7.7 mg/dL — ABNORMAL LOW (ref 8.9–10.3)
Chloride: 111 mmol/L (ref 101–111)
Creatinine, Ser: 1.83 mg/dL — ABNORMAL HIGH (ref 0.44–1.00)
GFR calc Af Amer: 22 mL/min — ABNORMAL LOW (ref >60)
GFR calc Af Amer: 33 mL/min — ABNORMAL LOW (ref >60)
GFR calc non Af Amer: 19 mL/min — ABNORMAL LOW (ref >60)
GFR calc non Af Amer: 28 mL/min — ABNORMAL LOW (ref >60)
GLUCOSE: 72 mg/dL (ref 65–99)
GLUCOSE: 148 mg/dL — AB (ref 65–99)
POTASSIUM: 3.8 mmol/L (ref 3.5–5.1)
Potassium: 4 mmol/L (ref 3.5–5.1)
SODIUM: 138 mmol/L (ref 135–145)
Sodium: 138 mmol/L (ref 135–145)
TOTAL PROTEIN: 5.1 g/dL — AB (ref 6.5–8.1)
Total Bilirubin: 1.2 mg/dL (ref 0.3–1.2)
Total Bilirubin: 1.1 mg/dL (ref 0.3–1.2)
Total Protein: 6.2 g/dL — ABNORMAL LOW (ref 6.5–8.1)

## 2017-07-16 LAB — SEDIMENTATION RATE: SED RATE: 32 mm/h — AB (ref 0–22)

## 2017-07-16 LAB — TSH: TSH: 0.465 u[IU]/mL (ref 0.350–4.500)

## 2017-07-16 LAB — APTT: APTT: 37 s — AB (ref 24–36)

## 2017-07-16 LAB — URINALYSIS, ROUTINE W REFLEX MICROSCOPIC
Glucose, UA: NEGATIVE mg/dL
Ketones, ur: NEGATIVE mg/dL
Nitrite: NEGATIVE
PROTEIN: 100 mg/dL — AB
Specific Gravity, Urine: 1.023 (ref 1.005–1.030)
pH: 5 (ref 5.0–8.0)

## 2017-07-16 LAB — MAGNESIUM: Magnesium: 1.9 mg/dL (ref 1.7–2.4)

## 2017-07-16 LAB — MRSA PCR SCREENING: MRSA by PCR: NEGATIVE

## 2017-07-16 LAB — INFLUENZA PANEL BY PCR (TYPE A & B)
INFLAPCR: NEGATIVE
Influenza B By PCR: NEGATIVE

## 2017-07-16 LAB — PROTIME-INR
INR: 1.13
Prothrombin Time: 14.4 seconds (ref 11.4–15.2)

## 2017-07-16 LAB — TROPONIN I: Troponin I: 0.03 ng/mL (ref <0.03)

## 2017-07-16 LAB — I-STAT CG4 LACTIC ACID, ED
LACTIC ACID, VENOUS: 2.6 mmol/L — AB (ref 0.5–1.9)
Lactic Acid, Venous: 2.06 mmol/L (ref 0.5–1.9)

## 2017-07-16 LAB — LACTIC ACID, PLASMA: Lactic Acid, Venous: 1.5 mmol/L (ref 0.5–1.9)

## 2017-07-16 LAB — CBC
HCT: 42.2 % (ref 36.0–46.0)
HEMOGLOBIN: 13.8 g/dL (ref 12.0–15.0)
MCH: 28.5 pg (ref 26.0–34.0)
MCHC: 32.7 g/dL (ref 30.0–36.0)
MCV: 87 fL (ref 78.0–100.0)
Platelets: 122 10*3/uL — ABNORMAL LOW (ref 150–400)
RBC: 4.85 MIL/uL (ref 3.87–5.11)
RDW: 16.5 % — ABNORMAL HIGH (ref 11.5–15.5)
WBC: 15.3 10*3/uL — ABNORMAL HIGH (ref 4.0–10.5)

## 2017-07-16 LAB — PHOSPHORUS: Phosphorus: 4.2 mg/dL (ref 2.5–4.6)

## 2017-07-16 LAB — CREATININE, SERUM
CREATININE: 2.02 mg/dL — AB (ref 0.44–1.00)
GFR, EST AFRICAN AMERICAN: 29 mL/min — AB (ref >60)
GFR, EST NON AFRICAN AMERICAN: 25 mL/min — AB (ref >60)

## 2017-07-16 LAB — CBG MONITORING, ED
Glucose-Capillary: 58 mg/dL — ABNORMAL LOW (ref 65–99)
Glucose-Capillary: 65 mg/dL (ref 65–99)

## 2017-07-16 LAB — D-DIMER, QUANTITATIVE (NOT AT ARMC): D DIMER QUANT: 7.82 ug{FEU}/mL — AB (ref 0.00–0.50)

## 2017-07-16 LAB — SODIUM, URINE, RANDOM

## 2017-07-16 MED ORDER — MORPHINE SULFATE (PF) 4 MG/ML IV SOLN
2.0000 mg | INTRAVENOUS | Status: DC | PRN
  Administered 2017-07-17 – 2017-07-20 (×8): 2 mg via INTRAVENOUS
  Filled 2017-07-16 (×9): qty 1

## 2017-07-16 MED ORDER — ACETAMINOPHEN 325 MG PO TABS
650.0000 mg | ORAL_TABLET | ORAL | Status: DC | PRN
  Administered 2017-07-19 – 2017-07-21 (×5): 650 mg via ORAL
  Filled 2017-07-16 (×5): qty 2

## 2017-07-16 MED ORDER — SODIUM CHLORIDE 0.9 % IV SOLN: 500.0000 mg | INTRAVENOUS | Status: DC

## 2017-07-16 MED ORDER — DEXAMETHASONE SODIUM PHOSPHATE 10 MG/ML IJ SOLN
10.0000 mg | Freq: Once | INTRAMUSCULAR | Status: AC
  Administered 2017-07-16: 10 mg via INTRAVENOUS
  Filled 2017-07-16: qty 1

## 2017-07-16 MED ORDER — HYDROCODONE-ACETAMINOPHEN 10-325 MG PO TABS: 1.0000 | ORAL_TABLET | Freq: Four times a day (QID) | ORAL | Status: DC | PRN

## 2017-07-16 MED ORDER — SODIUM CHLORIDE 0.9 % IV SOLN: 1.0000 g | INTRAVENOUS | Status: DC

## 2017-07-16 MED ORDER — PIPERACILLIN-TAZOBACTAM 3.375 G IVPB 30 MIN
3.3750 g | Freq: Once | INTRAVENOUS | Status: DC
  Filled 2017-07-16: qty 50

## 2017-07-16 MED ORDER — MORPHINE SULFATE (PF) 4 MG/ML IV SOLN
1.0000 mg | INTRAVENOUS | Status: DC | PRN
  Administered 2017-07-17 – 2017-07-18 (×4): 1 mg via INTRAVENOUS
  Filled 2017-07-16 (×5): qty 1

## 2017-07-16 MED ORDER — DILTIAZEM LOAD VIA INFUSION
10.0000 mg | Freq: Once | INTRAVENOUS | Status: AC
  Administered 2017-07-16: 10 mg via INTRAVENOUS
  Filled 2017-07-16: qty 10

## 2017-07-16 MED ORDER — MORPHINE SULFATE (PF) 4 MG/ML IV SOLN
1.0000 mg | Freq: Once | INTRAVENOUS | Status: AC
  Administered 2017-07-16: 1 mg via INTRAVENOUS
  Filled 2017-07-16: qty 1

## 2017-07-16 MED ORDER — DILTIAZEM HCL-DEXTROSE 100-5 MG/100ML-% IV SOLN (PREMIX)
5.0000 mg/h | INTRAVENOUS | Status: DC
  Administered 2017-07-16: 5 mg/h via INTRAVENOUS
  Administered 2017-07-17 (×2): 10 mg/h via INTRAVENOUS
  Administered 2017-07-17 (×2): 15 mg/h via INTRAVENOUS
  Administered 2017-07-18 – 2017-07-19 (×5): 10 mg/h via INTRAVENOUS
  Filled 2017-07-16 (×10): qty 100

## 2017-07-16 MED ORDER — SODIUM CHLORIDE 0.9 % IV SOLN
INTRAVENOUS | Status: AC
  Administered 2017-07-16 – 2017-07-17 (×2): via INTRAVENOUS

## 2017-07-16 MED ORDER — SODIUM CHLORIDE 0.9 % IV SOLN
1.0000 g | INTRAVENOUS | Status: DC
  Filled 2017-07-16: qty 1

## 2017-07-16 MED ORDER — DEXTROSE-NACL 5-0.9 % IV SOLN
INTRAVENOUS | Status: AC
  Administered 2017-07-16: 100 mL/h via INTRAVENOUS

## 2017-07-16 MED ORDER — SODIUM CHLORIDE 0.9 % IV SOLN
2.0000 g | Freq: Once | INTRAVENOUS | Status: AC
  Administered 2017-07-16: 2 g via INTRAVENOUS
  Filled 2017-07-16: qty 2

## 2017-07-16 MED ORDER — HEPARIN SODIUM (PORCINE) 5000 UNIT/ML IJ SOLN
5000.0000 [IU] | Freq: Three times a day (TID) | INTRAMUSCULAR | Status: DC
  Administered 2017-07-16 – 2017-07-17 (×2): 5000 [IU] via SUBCUTANEOUS
  Filled 2017-07-16 (×2): qty 1

## 2017-07-16 MED ORDER — MAGIC MOUTHWASH
1.0000 mL | Freq: Three times a day (TID) | ORAL | Status: AC
  Administered 2017-07-16 – 2017-07-17 (×2): 1 mL via ORAL
  Filled 2017-07-16 (×2): qty 5

## 2017-07-16 MED ORDER — DEXTROSE 50 % IV SOLN
25.0000 mL | Freq: Once | INTRAVENOUS | Status: AC
  Administered 2017-07-16: 25 mL via INTRAVENOUS
  Filled 2017-07-16: qty 50

## 2017-07-16 MED ORDER — ASPIRIN 325 MG PO TABS
325.0000 mg | ORAL_TABLET | Freq: Every day | ORAL | Status: DC
  Administered 2017-07-16 – 2017-07-20 (×4): 325 mg via ORAL
  Filled 2017-07-16 (×4): qty 1

## 2017-07-16 MED ORDER — SODIUM CHLORIDE 0.9 % IV BOLUS (SEPSIS)
1000.0000 mL | Freq: Once | INTRAVENOUS | Status: AC
  Administered 2017-07-16: 1000 mL via INTRAVENOUS

## 2017-07-16 MED ORDER — VANCOMYCIN HCL 500 MG IV SOLR
500.0000 mg | Freq: Once | INTRAVENOUS | Status: AC
Start: 2017-07-16 — End: 2017-07-16
  Administered 2017-07-16: 500 mg via INTRAVENOUS
  Filled 2017-07-16: qty 500

## 2017-07-16 MED ORDER — ENOXAPARIN SODIUM 80 MG/0.8ML ~~LOC~~ SOLN
80.0000 mg | Freq: Two times a day (BID) | SUBCUTANEOUS | Status: DC
  Filled 2017-07-16: qty 0.8

## 2017-07-16 MED ORDER — AMIODARONE LOAD VIA INFUSION
150.0000 mg | Freq: Once | INTRAVENOUS | Status: DC
  Filled 2017-07-16: qty 83.34

## 2017-07-16 MED ORDER — AMIODARONE HCL IN DEXTROSE 360-4.14 MG/200ML-% IV SOLN: 30.0000 mg/h | INTRAVENOUS | Status: DC

## 2017-07-16 MED ORDER — AMIODARONE HCL IN DEXTROSE 360-4.14 MG/200ML-% IV SOLN: 60.0000 mg/h | INTRAVENOUS | Status: DC

## 2017-07-16 MED ORDER — LIP MEDEX EX OINT
TOPICAL_OINTMENT | CUTANEOUS | Status: AC
  Administered 2017-07-17
  Filled 2017-07-16: qty 7

## 2017-07-16 MED ORDER — HEPARIN SODIUM (PORCINE) 5000 UNIT/ML IJ SOLN: 5000.0000 [IU] | Freq: Three times a day (TID) | INTRAMUSCULAR | Status: DC

## 2017-07-16 MED ORDER — VANCOMYCIN HCL IN DEXTROSE 1-5 GM/200ML-% IV SOLN: 1000.0000 mg | INTRAVENOUS | Status: DC

## 2017-07-16 MED ORDER — SODIUM CHLORIDE 0.9 % IV BOLUS (SEPSIS)
500.0000 mL | Freq: Once | INTRAVENOUS | Status: AC
  Administered 2017-07-16: 500 mL via INTRAVENOUS

## 2017-07-16 MED ORDER — SODIUM CHLORIDE 0.9 % IV BOLUS
1000.0000 mL | Freq: Once | INTRAVENOUS | Status: AC
  Administered 2017-07-16: 1000 mL via INTRAVENOUS

## 2017-07-16 MED ORDER — VANCOMYCIN HCL IN DEXTROSE 1-5 GM/200ML-% IV SOLN
1000.0000 mg | Freq: Once | INTRAVENOUS | Status: AC
  Administered 2017-07-16: 1000 mg via INTRAVENOUS
  Filled 2017-07-16: qty 200

## 2017-07-16 MED ORDER — SODIUM CHLORIDE 0.9 % IV BOLUS
500.0000 mL | Freq: Once | INTRAVENOUS | Status: AC
  Administered 2017-07-16: 500 mL via INTRAVENOUS

## 2017-07-16 MED ORDER — PANTOPRAZOLE SODIUM 40 MG PO TBEC
40.0000 mg | DELAYED_RELEASE_TABLET | Freq: Every day | ORAL | Status: DC
  Administered 2017-07-16 – 2017-07-20 (×4): 40 mg via ORAL
  Filled 2017-07-16 (×4): qty 1

## 2017-07-16 MED ORDER — METHOCARBAMOL 500 MG PO TABS
500.0000 mg | ORAL_TABLET | Freq: Two times a day (BID) | ORAL | Status: DC
  Administered 2017-07-16 – 2017-07-20 (×8): 500 mg via ORAL
  Filled 2017-07-16 (×8): qty 1

## 2017-07-16 NOTE — ED Notes (Signed)
Fluid bolus brought patient to SBP 100-cardizem drip increased to 15 mg/hr-Dr. Maudie Mercury aware patient is too unstable for stat MRI at this time. Dr. Maudie Mercury to consult critical care.Marland Kitchen

## 2017-07-16 NOTE — ED Triage Notes (Signed)
Patient's husband states the patient "started zoning out" 2 days ago and feeling really tired. Patient went to PCP and was hypotensive(70/42). Patient's husband states the patient is hallucinating(seeing bugs, etc) and feeling confused. No remembering where she is.

## 2017-07-16 NOTE — ED Notes (Signed)
Dr. Maudie Mercury aware of patient's continued uncontrolled AF with VR 140-170 and SBP 70's-NS fluid bolus being administered-AC aware of patient's critical condition and stepdown bed assigned-patient is pale-denies any pain

## 2017-07-16 NOTE — ED Notes (Signed)
Lactic given to RN and Isaac(EDP).

## 2017-07-16 NOTE — Progress Notes (Signed)
Pharmacy Antibiotic Note  Theresa Morrow is a 62 y.o. female admitted on 08/07/2017 with c/o ongoing, severe back pain, increased confusion. Hx of PNA several wks ago. CXray clear, no acute process. CT: non-obstructing renal stones, mild pulm lingular infiltrate.  PMH: ED visits 5/1 and 5/4 for back pain  Pharmacy has been consulted for Vancomycin & Cefepime dosing. Patient received one dose of each in ED, then Ceftriaxone/Azithromycin ordered for CAP treatment. Abx change to Vancomycin/Cefepime  Plan: Vancomycin 1gm x1, plus additional 500mg  for loading dose 1500mg , then 1gm IV every 48 hours.  Goal trough 15-20 mcg/mL.  Cefepime 2gm x1, then 1gm q24h  Height: 5\' 7"  (170.2 cm) Weight: 182 lb (82.6 kg) IBW/kg (Calculated) : 61.6  Temp (24hrs), Avg:97.7 F (36.5 C), Min:97.7 F (36.5 C), Max:97.7 F (36.5 C)  Recent Labs  Lab 07/14/17 1940 07/18/2017 1238 07/13/2017 1330 07/15/2017 1605  WBC 2.2* 15.3*  --   --   CREATININE 1.40* 2.56*  --   --   LATICACIDVEN  --   --  2.60* 2.06*    Estimated Creatinine Clearance: 25.2 mL/min (A) (by C-G formula based on SCr of 2.56 mg/dL (H)).    No Known Allergies  Antimicrobials this admission:  5/6 Vanc >>  5/6 CEfepime >>   Dose adjustments this admission:   Microbiology results:  5/6 BCx: sent 5/6 UCx: sent       Strep pneumo: ordered       Legionella: ordered       Flu panel PCR: ordered  Thank you for allowing pharmacy to be a part of this patient's care.  Minda Ditto PharmD Pager [phone] 07/29/2017, 7:34 PM

## 2017-07-16 NOTE — Consult Note (Addendum)
..   Name: Theresa Morrow MRN: [id number] DOB: May 20, 1955    ADMISSION DATE:  07/15/2017 CONSULTATION DATE:  07/28/2017  REFERRING MD :  Maudie Mercury MD (Hospitalist)  REASON FOR CONSULT:  New onset Afib RVR in Septic pt, Hypotensive post Cardizem CHIEF COMPLAINT: Hypotension, AMS  BRIEF PATIENT DESCRIPTION: 62 yr old female with PMHx Acoustic neuroma s/p resection, osteoarthritis ( knees, hips and back), s/p Roux- En-Y Gastric Bypass, Anemia presents with lower back pain was found to be altered have a low BP. Admitted for sepsis by Hospitalist. PCCM consulted when pt became hypotensive after Cardizem was started for Afib RVR  SIGNIFICANT EVENTS  Hypotension New onset Afib RVR  STUDIES:  CXR, CTH, CTAB/pelv CT lumbar spine-07/14/17 B/l LE DVT study on 5/5- negative   HISTORY OF PRESENT ILLNESS: (history obtained from EMR, other providers and patient's own acct)  62 yr old female with PMHx acoustic neuroma s/p resection, osteoarthritis ( knees, hips and back), s/p Roux- En-Y Gastric Bypass, Anemia, GERD, h/o PUD presents with lower back pain was found to be altered have a low BP.  Pt states that she has been experiencing low back pain for the last week and it has been progressively worsening. She stated that it radiates up her back and she denies numbness and tingling in her extremities, she denies loss of urinary or bowel continence, she also denies fevers. She does report tenderness and pain when lying on her back.  She was evaluated by [facility] on 07/14/17 for the same back pain assoc with b/l leg pain (starting in the ankles migrating upwards) she states she has been fairly inactive with the back pain. Per the patient when it was at its worst it took her one hour to complete a 10 min walk.  She states that she tried to take a Vicodin which only achieved minimal relief of her pain. Also she was treated for a pneumonia several weeks ago. She denies any cough, chest pain, SOB or fever.   Admitted for sepsis  by Hospitalist. PCCM consulted when pt became hypotensive after Cardizem was started for Afib RVR  PAST MEDICAL HISTORY :   has a past medical history of Anemia, Bile duct stone, Cancer ([place]), Cholangitis, Choledocholithiasis, Chronic headaches, Complication of anesthesia, GERD (gastroesophageal reflux disease), Hepatomegaly, Kidney stones, Obesity, and Osteoarthritis.  has a past surgical history that includes Roux-en-Y Gastric Bypass (2011); Knee arthroscopy (Left, 2004); Cholecystectomy open (1977); Acoustic neuroma resection (2007); and Esophagogastroduodenoscopy (N/A, 08/15/2013). Prior to Admission medications   Medication Sig Start Date End Date Taking? Authorizing Provider  aspirin-acetaminophen-caffeine (EXCEDRIN MIGRAINE) [phone] MG tablet Take 2 tablets by mouth every 6 (six) hours as needed for headache.   Yes [provider]  calcium carbonate (TUMS - DOSED IN MG ELEMENTAL CALCIUM) 500 MG chewable tablet Chew 1 tablet by mouth as needed for indigestion or heartburn.   Yes [provider]  cephALEXin (KEFLEX) 500 MG capsule Take 1 capsule (500 mg total) by mouth 4 (four) times daily. 07/14/17  Yes Recardo Evangelist, PA-C  Cholecalciferol (VITAMIN D PO) Take 1 tablet by mouth daily.   Yes [provider]  HYDROcodone-acetaminophen (NORCO) 10-325 MG tablet Take 1 tablet by mouth every 6 (six) hours as needed for moderate pain.    Yes [provider]  ibuprofen (ADVIL,MOTRIN) 200 MG tablet Take 400 mg by mouth every 6 (six) hours as needed.   Yes [provider]  methocarbamol (ROBAXIN) 500 MG tablet Take 1 tablet (500 mg  total) by mouth 2 (two) times daily. 07/11/17  Yes Varney Biles, MD  Multiple Vitamin (MULTI-VITAMINS) TABS Take 1 tablet by mouth daily.   Yes [provider]  naproxen (NAPROSYN) 375 MG tablet Take 1 tablet (375 mg total) by mouth 2 (two) times daily. 07/11/17  Yes Varney Biles, MD  oxyCODONE-acetaminophen  (PERCOCET/ROXICET) 5-325 MG tablet Take 1 tablet by mouth every 4 (four) hours as needed for severe pain. 07/14/17  Yes Virgel Manifold, MD  pantoprazole (PROTONIX) 40 MG tablet TAKE 1 TABLET(40 MG) BY MOUTH DAILY 11/16/16  Yes Nandigam, Venia Minks, MD   No Known Allergies  FAMILY HISTORY:  family history includes Diabetes in her mother; Gallbladder disease in her maternal grandmother, mother, and sister; Heart disease in her mother; Kidney cancer in her father. SOCIAL HISTORY:  reports that she has never smoked. She has never used smokeless tobacco. She reports that she does not drink alcohol or use drugs.  REVIEW OF SYSTEMS:  Pertinent positives are bolded Constitutional: Negative for fever, chills, weight loss, malaise/fatigue and diaphoresis.  HENT: Negative for hearing loss, ear pain, nosebleeds, congestion, sore throat, neck pain, tinnitus and ear discharge.   Eyes: Negative for blurred vision, double vision, photophobia, pain, discharge and redness.  Respiratory: Negative for cough, hemoptysis, sputum production, shortness of breath, wheezing and stridor.   Cardiovascular: Negative for chest pain, palpitations, orthopnea, claudication, leg swelling and PND.  Gastrointestinal: Negative for heartburn, nausea, vomiting, abdominal pain, diarrhea, constipation, blood in stool and melena.  Genitourinary: Negative for dysuria, urgency, frequency, hematuria and flank pain.  Musculoskeletal: Negative for myalgias, +back pain, joint pain and falls.  Skin: Negative for itching and rash.  Neurological: Negative for dizziness, tingling, tremors, sensory change, speech change, focal weakness, seizures, loss of consciousness, weakness and headaches.  Endo/Heme/Allergies: Negative for environmental allergies and polydipsia. Does not bruise/bleed easily.  SUBJECTIVE:   VITAL SIGNS: Temp:  [97.7 F (36.5 C)] 97.7 F (36.5 C) (05/06 1317) Pulse Rate:  [84-153] 141 (05/06 2115) Resp:  [11-23] 22 (05/06  2115) BP: (76-115)/(46-81) 98/66 (05/06 2115) SpO2:  [96 %-100 %] 96 % (05/06 2115) Weight:  [82.6 kg (182 lb)] 82.6 kg (182 lb) (05/06 1234)  PHYSICAL EXAMINATION: General:  Well nourished female in good spirits, moderate pain Neuro:  GCS 15, EOM intact, CN2-12 intact,  HEENT:  NCAT Cardiovascular:  Irregularly irregular S1 and S2 Lungs:  CTAB Abdomen:  Soft not distended with + BS Musculoskeletal:  B/l lower ext swelling  Skin:  Grossly intact  Recent Labs  Lab 07/14/17 1940 07/18/2017 1238 07/28/2017 2032  NA 141 138  --   K 3.7 4.0  --   CL 109 104  --   CO2 21* 18*  --   BUN 40* 63*  --   CREATININE 1.40* 2.56* 2.02*  GLUCOSE 94 72  --    Recent Labs  Lab 07/14/17 1940 07/18/2017 1238  HGB 12.6 13.8  HCT 38.9 42.2  WBC 2.2* 15.3*  PLT 119* 122*   Ct Abdomen Pelvis Wo Contrast  Result Date: 07/12/2017 CLINICAL DATA:  Hypotension and abdominal pain EXAM: CT ABDOMEN AND PELVIS WITHOUT CONTRAST TECHNIQUE: Multidetector CT imaging of the abdomen and pelvis was performed following the standard protocol without IV contrast. COMPARISON:  None. FINDINGS: Lower chest: Lung bases are well aerated with the exception of mild lingular infiltrate adjacent to the heart. Hepatobiliary: No focal liver abnormality is seen. Status post cholecystectomy. No biliary dilatation. Pancreas: Unremarkable. No pancreatic ductal dilatation or surrounding  inflammatory changes. Spleen: Normal in size without focal abnormality. Adrenals/Urinary Tract: The adrenals are within normal limits. Small nonobstructing stones are noted within the left kidney. The collecting systems are otherwise within normal limits. The bladder is partially decompressed. Stomach/Bowel: Postsurgical changes are noted in the stomach. A sliding-type hiatal hernia is noted. 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 The appendix is within normal limits. Vascular/Lymphatic: No significant vascular findings are present. No  enlarged abdominal or pelvic lymph nodes. Reproductive: Uterus and bilateral adnexa are unremarkable. Other: No abdominal wall hernia or abnormality. No abdominopelvic ascites. Musculoskeletal: Degenerative changes of lumbar spine are noted. IMPRESSION: Nonobstructing left renal stones. Postoperative changes as described. Mild lingular infiltrate. Electronically Signed   By: Inez Catalina M.D.   On: 07/26/2017 15:49   Ct Head Wo Contrast  Result Date: 08/08/2017 CLINICAL DATA:  Altered level of consciousness over the last 2 days, fatigue, hypotension EXAM: CT HEAD WITHOUT CONTRAST TECHNIQUE: Contiguous axial images were obtained from the base of the skull through the vertex without intravenous contrast. COMPARISON:  MR brain scan of 05/12/2014 FINDINGS: Brain: The ventricular system is normal in size and configuration and the septum is in a normal midline position. The fourth ventricle and basilar cisterns are unremarkable. No hemorrhage, mass lesion, or acute infarction is seen. 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 Vascular: No vascular abnormality is seen on this unenhanced study. Skull: On bone window images, changes of prior left mastoidectomy are noted. No acute calvarial abnormality is seen. Sinuses/Orbits: The paranasal sinuses appear well pneumatized. Other: None. IMPRESSION: 1. No acute intracranial abnormality. 2. 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 Electronically Signed   By: Ivar Drape M.D.   On: 08/03/2017 15:45   Dg Chest Port 1 View  Result Date: 08/09/2017 CLINICAL DATA:  Cough. EXAM: PORTABLE CHEST 1 VIEW COMPARISON:  Chest x-ray dated June 13, 2017. FINDINGS: The heart size and mediastinal contours are within normal limits. Normal pulmonary vascularity. Resolved consolidation in the lingula with residual scarring/atelectasis. No focal consolidation, pleural effusion, or pneumothorax. No acute osseous abnormality.  IMPRESSION: No active disease. Electronically Signed   By: Titus Dubin M.D.   On: 08/08/2017 13:11    ASSESSMENT / PLAN: 62 yr old female with PMHx Acoustic neuroma s/p resection, osteoarthritis ( knees, hips and back), s/p Roux- En-Y Gastric Bypass, Anemia, GERD, h/o PUD  Admitted by Hospitalist for Sepsis  presenting with lower back pain secondary to degenerative disease I have reviewed all imaging and labs   Initially hypotensive ->fluid responsive  Went into Afib RVR received Cardizem  Hypotension post is anticipated with rate control Continue IVF On my evaluation BP improved. No need for vasopressors at this time MAP goal >5mmHg  Lower Back pain No evidence of saddle anesthesia No constitutional symptoms Primary has ordered further imaging pt AAOx4 but continues to be in pain Pain may be an additional factor causing elevated HR Pain mgmt with scale added. Monitor response. Continue under Hospitalist's mgmt  Please feel free to re-consult PCCM if the patient's clinical condition changes/worsens   Seward Carol MD Pulmonary and Critical Care Medicine   07/30/2017, 10:51 PM

## 2017-07-16 NOTE — Progress Notes (Signed)
ANTICOAGULATION CONSULT NOTE - Initial Consult  Pharmacy Consult for Enoxaparin Indication: atrial fibrillation  No Known Allergies  Patient Measurements: Height: 5\' 7"  (170.2 cm) Weight: 182 lb (82.6 kg) IBW/kg (Calculated) : 61.6 Heparin Dosing Weight:   Vital Signs: Temp: 97.7 F (36.5 C) (05/06 1317) Temp Source: Rectal (05/06 1317) BP: 98/66 (05/06 2115) Pulse Rate: 141 (05/06 2115)  Labs: Recent Labs    07/14/17 1940 07/17/2017 1238 08/09/2017 1733 08/01/2017 2032  HGB 12.6 13.8  --   --   HCT 38.9 42.2  --   --   PLT 119* 122*  --   --   CREATININE 1.40* 2.56*  --  2.02*  TROPONINI  --   --  <0.03  --     Estimated Creatinine Clearance: 31.9 mL/min (A) (by C-G formula based on SCr of 2.02 mg/dL (H)).   Medical History: Past Medical History:  Diagnosis Date  . Anemia   . Bile duct stone    liver stones  . Cancer [facility])    brain tumor 2007  . Cholangitis   . Choledocholithiasis   . Chronic headaches   . Complication of anesthesia    low heart rate  . GERD (gastroesophageal reflux disease)   . Hepatomegaly   . Kidney stones   . Obesity   . Osteoarthritis     Medications:  Facility-Administered Medications Prior to Admission  Medication Dose Route Frequency Provider Last Rate Last Dose  . 0.9 %  sodium chloride infusion  500 mL Intravenous Continuous Nandigam, Kavitha V, MD       Medications Prior to Admission  Medication Sig Dispense Refill Last Dose  . aspirin-acetaminophen-caffeine (EXCEDRIN MIGRAINE) 250-250-65 MG tablet Take 2 tablets by mouth every 6 (six) hours as needed for headache.   07/20/2017 at Unknown time  . calcium carbonate (TUMS - DOSED IN MG ELEMENTAL CALCIUM) 500 MG chewable tablet Chew 1 tablet by mouth as needed for indigestion or heartburn.   07/25/2017 at Unknown time  . cephALEXin (KEFLEX) 500 MG capsule Take 1 capsule (500 mg total) by mouth 4 (four) times daily. 20 capsule 0 07/13/2017 at Unknown time  . Cholecalciferol (VITAMIN D  PO) Take 1 tablet by mouth daily.   Past Week at Unknown time  . HYDROcodone-acetaminophen (NORCO) 10-325 MG tablet Take 1 tablet by mouth every 6 (six) hours as needed for moderate pain.    07/26/2017 at Unknown time  . ibuprofen (ADVIL,MOTRIN) 200 MG tablet Take 400 mg by mouth every 6 (six) hours as needed.   07/15/2017 at Unknown time  . methocarbamol (ROBAXIN) 500 MG tablet Take 1 tablet (500 mg total) by mouth 2 (two) times daily. 20 tablet 0 07/15/2017 at Unknown time  . Multiple Vitamin (MULTI-VITAMINS) TABS Take 1 tablet by mouth daily.   Past Month at Unknown time  . naproxen (NAPROSYN) 375 MG tablet Take 1 tablet (375 mg total) by mouth 2 (two) times daily. 20 tablet 0 07/15/2017 at Unknown time  . oxyCODONE-acetaminophen (PERCOCET/ROXICET) 5-325 MG tablet Take 1 tablet by mouth every 4 (four) hours as needed for severe pain. 10 tablet 0 07/15/2017 at Unknown time  . pantoprazole (PROTONIX) 40 MG tablet TAKE 1 TABLET(40 MG) BY MOUTH DAILY 90 tablet 0 Past Month at Unknown time   Scheduled:  . amiodarone  150 mg Intravenous Once  . aspirin  325 mg Oral Daily  . enoxaparin (LOVENOX) injection  80 mg Subcutaneous BID  . methocarbamol  500 mg Oral BID  .  pantoprazole  40 mg Oral Daily    Assessment: Patient with order for enoxaparin for afib per pharmacy.  Prior heparin sq order d/c'd.    Goal of Therapy:  Anti-Xa level 0.6-1 units/ml 4hrs after LMWH dose given Monitor platelets by anticoagulation protocol: Yes   Plan:  Enoxaparin 80mg  sq q12hr CBC q 3 days  Tyler Deis, Shea Stakes Crowford 08/10/2017,10:23 PM

## 2017-07-16 NOTE — ED Notes (Signed)
Pt provided 8oz orange juice

## 2017-07-16 NOTE — Progress Notes (Addendum)
A consult was received from an ED physician for vancomycin and zosyn per pharmacy dosing.  The patient's profile has been reviewed for ht/wt/allergies/indication/available labs.   A one time order has been placed for vancomycin 1g and zosyn 3.375.    Further antibiotics/pharmacy consults should be ordered by admitting physician if indicated.                       Thank you, Peggyann Juba, PharmD, BCPS 07/13/2017  12:49 PM  Addendum: Lajean Silvius changed to Cefepime - will dose with 2g IV x 1, further consults should be ordered by admitting physician if indicated.  Peggyann Juba, PharmD, BCPS 08/02/2017 12:59 PM

## 2017-07-16 NOTE — ED Provider Notes (Signed)
[facility] DEPT Provider Note   CSN: [id number] Arrival date & time: 07/13/2017  1144     History   Chief Complaint Chief Complaint  Patient presents with  . Hypotension  . Altered Mental Status  . Hallucinations    HPI Theresa Morrow is a 62 y.o. female.  HPI 62 year old female with history of previous brain tumor, bowel obstruction, here with altered mental status.  The patient has had progressive worsening lower back pain for the last week.  She is been seen twice for this.  She had a plain film and CT scan which showed degenerative changes.  She states that she was given symptomatic treatment that has not improved her pain.  Over the last several days, she is become increasingly confused.  She has been hallucinating.  She has had aching, throbbing, lower back pain that shoots in her bilateral legs.  She is had some bruising on her bilateral legs as well.  Denies any other rash.  No trauma.  No loss of bowel or bladder function.  Denies any headache or neck pain or neck stiffness.  No cough or shortness of breath.  She did have a pneumonia several weeks ago, but this is resolved.  She has no history of previous injuries.  No history of previous instrumentation.  No history of IV drug use.  Pain is worse with any kind of movement or palpation.  No alleviating factors.  Past Medical History:  Diagnosis Date  . Anemia   . Bile duct stone    liver stones  . Cancer [facility])    brain tumor 2007  . Cholangitis   . Choledocholithiasis   . Chronic headaches   . Complication of anesthesia    low heart rate  . GERD (gastroesophageal reflux disease)   . Hepatomegaly   . Kidney stones   . Obesity   . Osteoarthritis     Patient Active Problem List   Diagnosis Date Noted  . ARF (acute renal failure) ([place]) 08/06/2017  . Atrial fibrillation with RVR ([place]) 08/03/2017  . Sepsis ([place]) 07/25/2017  . UTI (urinary tract infection) 07/29/2017  . CAP (community  acquired pneumonia) 07/13/2017  . Esophageal reflux 08/15/2013  . Reflux esophagitis 08/15/2013    Past Surgical History:  Procedure Laterality Date  . ACOUSTIC NEUROMA RESECTION  2007  . CHOLECYSTECTOMY OPEN  1977  . ESOPHAGOGASTRODUODENOSCOPY N/A 08/15/2013   Procedure: ESOPHAGOGASTRODUODENOSCOPY (EGD);  Surgeon: Lafayette Dragon, MD;  Location: [facility];  Service: Endoscopy;  Laterality: N/A;  . KNEE ARTHROSCOPY Left 2004  . ROUX-EN-Y GASTRIC BYPASS  2011   and removed liver stones     OB History   None      Home Medications    Prior to Admission medications   Medication Sig Start Date End Date Taking? Authorizing Provider  aspirin-acetaminophen-caffeine (EXCEDRIN MIGRAINE) [phone] MG tablet Take 2 tablets by mouth every 6 (six) hours as needed for headache.   Yes [provider]  calcium carbonate (TUMS - DOSED IN MG ELEMENTAL CALCIUM) 500 MG chewable tablet Chew 1 tablet by mouth as needed for indigestion or heartburn.   Yes [provider]  cephALEXin (KEFLEX) 500 MG capsule Take 1 capsule (500 mg total) by mouth 4 (four) times daily. 07/14/17  Yes Recardo Evangelist, PA-C  Cholecalciferol (VITAMIN D PO) Take 1 tablet by mouth daily.   Yes [provider]  HYDROcodone-acetaminophen (NORCO) 10-325 MG tablet Take 1 tablet by mouth every 6 (six)  hours as needed for moderate pain.    Yes [provider]  ibuprofen (ADVIL,MOTRIN) 200 MG tablet Take 400 mg by mouth every 6 (six) hours as needed.   Yes [provider]  methocarbamol (ROBAXIN) 500 MG tablet Take 1 tablet (500 mg total) by mouth 2 (two) times daily. 07/11/17  Yes Varney Biles, MD  Multiple Vitamin (MULTI-VITAMINS) TABS Take 1 tablet by mouth daily.   Yes [provider]  naproxen (NAPROSYN) 375 MG tablet Take 1 tablet (375 mg total) by mouth 2 (two) times daily. 07/11/17  Yes Varney Biles, MD  oxyCODONE-acetaminophen (PERCOCET/ROXICET) 5-325 MG tablet Take 1  tablet by mouth every 4 (four) hours as needed for severe pain. 07/14/17  Yes Virgel Manifold, MD  pantoprazole (PROTONIX) 40 MG tablet TAKE 1 TABLET(40 MG) BY MOUTH DAILY 11/16/16  Yes Nandigam, Venia Minks, MD    Family History Family History  Problem Relation Age of Onset  . Heart disease Mother   . Gallbladder disease Mother   . Diabetes Mother   . Gallbladder disease Sister   . Gallbladder disease Maternal Grandmother   . Kidney cancer Father   . Colon cancer Neg Hx     Social History Social History   Tobacco Use  . Smoking status: Never Smoker  . Smokeless tobacco: Never Used  Substance Use Topics  . Alcohol use: No  . Drug use: No     Allergies   Patient has no known allergies.   Review of Systems Review of Systems  Constitutional: Positive for chills and fatigue.  Gastrointestinal: Positive for nausea.  Musculoskeletal: Positive for back pain.  Neurological: Positive for weakness.  All other systems reviewed and are negative.    Physical Exam Updated Vital Signs BP (!) 88/68   Pulse 92   Temp 97.7 F (36.5 C) (Rectal)   Resp 16   Ht 5\' 7"  (1.702 m)   Wt 82.6 kg (182 lb)   SpO2 99%   BMI 28.51 kg/m   Physical Exam  Constitutional: She is oriented to person, place, and time. She appears well-developed and well-nourished. She has a sickly appearance. She appears ill. No distress.  HENT:  Head: Normocephalic and atraumatic.  Markedly dry mucous membranes  Eyes: Conjunctivae are normal.  Neck: Neck supple.  Cardiovascular: Regular rhythm and normal heart sounds. Tachycardia present. Exam reveals no friction rub.  No murmur heard. Pulmonary/Chest: Effort normal and breath sounds normal. No respiratory distress. She has no wheezes. She has no rales.  Abdominal: She exhibits no distension.  Musculoskeletal: She exhibits edema (2+ pitting edema bilateral lower extremities, mild erythema and vascular changes bilateral legs, without purpura or petechia).    Neurological: She is alert and oriented to person, place, and time. Cranial nerve deficit:   She exhibits normal muscle tone.  Skin: Skin is warm. Capillary refill takes less than 2 seconds.  Psychiatric: She has a normal mood and affect.  Nursing note and vitals reviewed.   Spine Exam: Inspection/Palpation: Moderate tenderness over lower lumbar spine.  No bruising or deformity.  No rash. Strength: 5/5 throughout LE bilaterally (hip flexion/extension, adduction/abduction; knee flexion/extension; foot dorsiflexion/plantarflexion, inversion/eversion; great toe inversion) Sensation: Intact to light touch in proximal and distal LE bilaterally Reflexes: 2+ quadriceps and achilles reflexes  ED Treatments / Results  Labs (all labs ordered are listed, but only abnormal results are displayed) Labs Reviewed  COMPREHENSIVE METABOLIC PANEL - Abnormal; Notable for the following components:      Result Value  CO2 18 (*)    BUN 63 (*)    Creatinine, Ser 2.56 (*)    Calcium 8.5 (*)    Total Protein 6.2 (*)    Albumin 2.7 (*)    AST 72 (*)    Alkaline Phosphatase 161 (*)    GFR calc non Af Amer 19 (*)    GFR calc Af Amer 22 (*)    Anion gap 16 (*)    All other components within normal limits  CBC - Abnormal; Notable for the following components:   WBC 15.3 (*)    RDW 16.5 (*)    Platelets 122 (*)    All other components within normal limits  URINALYSIS, ROUTINE W REFLEX MICROSCOPIC - Abnormal; Notable for the following components:   Color, Urine AMBER (*)    APPearance CLOUDY (*)    Hgb urine dipstick MODERATE (*)    Bilirubin Urine SMALL (*)    Protein, ur 100 (*)    Leukocytes, UA TRACE (*)    Bacteria, UA RARE (*)    All other components within normal limits  SEDIMENTATION RATE - Abnormal; Notable for the following components:   Sed Rate 32 (*)    All other components within normal limits  D-DIMER, QUANTITATIVE (NOT AT [facility]) - Abnormal; Notable for the following components:    D-Dimer, Quant 7.82 (*)    All other components within normal limits  I-STAT CG4 LACTIC ACID, ED - Abnormal; Notable for the following components:   Lactic Acid, Venous 2.60 (*)    All other components within normal limits  CBG MONITORING, ED - Abnormal; Notable for the following components:   Glucose-Capillary 58 (*)    All other components within normal limits  I-STAT CG4 LACTIC ACID, ED - Abnormal; Notable for the following components:   Lactic Acid, Venous 2.06 (*)    All other components within normal limits  CULTURE, BLOOD (ROUTINE X 2)  CULTURE, BLOOD (ROUTINE X 2)  URINE CULTURE  CULTURE, BLOOD (ROUTINE X 2)  CULTURE, BLOOD (ROUTINE X 2)  CULTURE, EXPECTORATED SPUTUM-ASSESSMENT  GRAM STAIN  RESPIRATORY PANEL BY PCR  TROPONIN I  TSH  C-REACTIVE PROTEIN  TROPONIN I  TROPONIN I  CORTISOL  HEPATITIS PANEL, ACUTE  SODIUM, URINE, RANDOM  CALCIUM / CREATININE RATIO, URINE  HIV ANTIBODY (ROUTINE TESTING)  STREP PNEUMONIAE URINARY ANTIGEN  CREATININE, SERUM  CK TOTAL AND CKMB (NOT AT [facility])  CBC  INFLUENZA PANEL BY PCR (TYPE A & B)  LEGIONELLA PNEUMOPHILA SEROGP 1 UR AG  CBG MONITORING, ED  I-STAT CHEM 8, ED  I-STAT CG4 LACTIC ACID, ED  I-STAT CG4 LACTIC ACID, ED  CYTOLOGY - NON PAP    EKG EKG Interpretation  Date/Time:  Monday Jul 16 2017 13:16:25 EDT Ventricular Rate:  99 PR Interval:    QRS Duration: 89 QT Interval:  341 QTC Calculation: 438 R Axis:   5 Text Interpretation:  Sinus rhythm Low voltage, extremity leads Since last EKG, rate has increased Basleine wander Non-specific ST-t changes Confirmed by Duffy Bruce [phone]) on 07/18/2017 4:12:04 PM   Radiology Ct Abdomen Pelvis Wo Contrast  Result Date: 07/30/2017 CLINICAL DATA:  Hypotension and abdominal pain EXAM: CT ABDOMEN AND PELVIS WITHOUT CONTRAST TECHNIQUE: Multidetector CT imaging of the abdomen and pelvis was performed following the standard protocol without IV contrast. COMPARISON:  None.  FINDINGS: Lower chest: Lung bases are well aerated with the exception of mild lingular infiltrate adjacent to the heart. Hepatobiliary: No focal liver abnormality is  seen. Status post cholecystectomy. No biliary dilatation. Pancreas: Unremarkable. No pancreatic ductal dilatation or surrounding inflammatory changes. Spleen: Normal in size without focal abnormality. Adrenals/Urinary Tract: The adrenals are within normal limits. Small nonobstructing stones are noted within the left kidney. The collecting systems are otherwise within normal limits. The bladder is partially decompressed. Stomach/Bowel: Postsurgical changes are noted in the stomach. A sliding-type hiatal hernia is noted. 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 The appendix is within normal limits. Vascular/Lymphatic: No significant vascular findings are present. No enlarged abdominal or pelvic lymph nodes. Reproductive: Uterus and bilateral adnexa are unremarkable. Other: No abdominal wall hernia or abnormality. No abdominopelvic ascites. Musculoskeletal: Degenerative changes of lumbar spine are noted. IMPRESSION: Nonobstructing left renal stones. Postoperative changes as described. Mild lingular infiltrate. Electronically Signed   By: Inez Catalina M.D.   On: 08/03/2017 15:49   Ct Head Wo Contrast  Result Date: 07/13/2017 CLINICAL DATA:  Altered level of consciousness over the last 2 days, fatigue, hypotension EXAM: CT HEAD WITHOUT CONTRAST TECHNIQUE: Contiguous axial images were obtained from the base of the skull through the vertex without intravenous contrast. COMPARISON:  MR brain scan of 05/12/2014 FINDINGS: Brain: The ventricular system is normal in size and configuration and the septum is in a normal midline position. The fourth ventricle and basilar cisterns are unremarkable. No hemorrhage, mass lesion, or acute infarction is seen. 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 Vascular: No vascular  abnormality is seen on this unenhanced study. Skull: On bone window images, changes of prior left mastoidectomy are noted. No acute calvarial abnormality is seen. Sinuses/Orbits: The paranasal sinuses appear well pneumatized. Other: None. IMPRESSION: 1. No acute intracranial abnormality. 2. 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 Electronically Signed   By: Ivar Drape M.D.   On: 08/10/2017 15:45   Ct Lumbar Spine Wo Contrast  Result Date: 07/14/2017 CLINICAL DATA:  Initial evaluation for low back pain with bilateral leg pain. EXAM: CT LUMBAR SPINE WITHOUT CONTRAST TECHNIQUE: Multidetector CT imaging of the lumbar spine was performed without intravenous contrast administration. Multiplanar CT image reconstructions were also generated. COMPARISON:  Prior radiograph 07/11/2017. FINDINGS: Segmentation: Normal segmentation. Lowest well-formed disc labeled the L5-S1 level. Alignment: 3 mm retrolisthesis of L2 on L3, with up to 4 mm retrolisthesis of L4 on L5. Findings are likely chronic and facet mediated. Mild levoscoliosis, apex at L3. Vertebrae: Vertebral body heights maintained without evidence for acute or chronic fracture. Chronic reactive endplate changes present about the right aspect of the L3-4 interspace due to scoliotic curvature. No discrete lytic or blastic osseous lesions. Visualized sacrum and pelvis unremarkable. Paraspinal and other soft tissues: No acute paraspinous soft tissue abnormality. Patient status post gastric bypass. Punctate nonobstructive left renal nephrolithiasis. Disc levels: L1-2: Mild diffuse disc bulge, asymmetric to the right. Mild bilateral facet hypertrophy. No significant canal stenosis. Mild right L1 foraminal narrowing. L2-3: Retrolisthesis. Chronic intervertebral disc space narrowing with disc bulge. Mild facet hypertrophy. Resultant mild canal with moderate bilateral L2 foraminal stenosis. L3-4: Chronic intervertebral disc space narrowing  with diffuse disc bulge. Associated disc desiccation noted. Moderate facet hypertrophy. Resultant moderate canal with bilateral lateral recess narrowing. Severe right with moderate left L3 foraminal stenosis. L4-5: Anterolisthesis. Diffuse disc bulge, slightly asymmetric to the right. Severe facet arthropathy. Resultant severe canal with bilateral subarticular stenosis. Moderate bilateral L4 foraminal narrowing, slightly worse on the left. L5-S1: Diffuse disc bulge with intervertebral disc space narrowing. Severe  left with moderate right facet arthrosis. Resultant mild left lateral recess narrowing without significant canal stenosis. Moderate left L5 foraminal narrowing. No significant right foraminal encroachment. IMPRESSION: 1. No acute abnormality within the lumbar spine. 2. Levoscoliosis with multilevel degenerative spondylolysis with resultant multilevel canal narrowing as above, moderate at L3-4 and severe at L4-5. 3. Multifactorial degenerative changes with resultant multilevel foraminal narrowing as above. 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 4. Punctate nonobstructive left renal nephrolithiasis. Electronically Signed   By: Jeannine Boga M.D.   On: 07/14/2017 20:24   Dg Chest Port 1 View  Result Date: 08/05/2017 CLINICAL DATA:  Cough. EXAM: PORTABLE CHEST 1 VIEW COMPARISON:  Chest x-ray dated June 13, 2017. FINDINGS: The heart size and mediastinal contours are within normal limits. Normal pulmonary vascularity. Resolved consolidation in the lingula with residual scarring/atelectasis. No focal consolidation, pleural effusion, or pneumothorax. No acute osseous abnormality. IMPRESSION: No active disease. Electronically Signed   By: Titus Dubin M.D.   On: 07/28/2017 13:11    Procedures .Critical Care Performed by: Duffy Bruce, MD Authorized by: Duffy Bruce, MD   Critical care provider statement:    Critical care time (minutes):  45   Critical care time was exclusive of:  Separately billable procedures and treating other patients and teaching time   Critical care was necessary to treat or prevent imminent or life-threatening deterioration of the following conditions:  Circulatory failure, cardiac failure, dehydration and sepsis   Critical care was time spent personally by me on the following activities:  Development of treatment plan with patient or surrogate, discussions with consultants, evaluation of patient's response to treatment, examination of patient, obtaining history from patient or surrogate, ordering and performing treatments and interventions, ordering and review of laboratory studies, ordering and review of radiographic studies, pulse oximetry, re-evaluation of patient's condition and review of old charts   I assumed direction of critical care for this patient from another provider in my specialty: no     (including critical care time)  Medications Ordered in ED Medications  0.9 %  sodium chloride infusion (has no administration in time range)  dextrose 5 %-0.9 % sodium chloride infusion (100 mL/hr Intravenous New Bag/Given 07/22/2017 1839)  diltiazem (CARDIZEM) 1 mg/mL load via infusion 10 mg (10 mg Intravenous Bolus from Bag 07/30/2017 1840)    And  diltiazem (CARDIZEM) 100 mg in dextrose 5% 153mL (1 mg/mL) infusion (5 mg/hr Intravenous New Bag/Given 07/20/2017 1841)  heparin injection 5,000 Units (has no administration in time range)  aspirin tablet 325 mg (has no administration in time range)  vancomycin (VANCOCIN) IVPB 1000 mg/200 mL premix (0 mg Intravenous Stopped 07/26/2017 1424)  sodium chloride 0.9 % bolus 1,000 mL (0 mLs Intravenous Stopped 07/15/2017 1356)    And  sodium chloride 0.9 % bolus 1,000 mL (0 mLs Intravenous Stopped 07/18/2017 1356)    And  sodium chloride 0.9 % bolus 500 mL (0 mLs  Intravenous Stopped 07/18/2017 1424)  dexamethasone (DECADRON) injection 10 mg (10 mg Intravenous Given 07/27/2017 1323)  ceFEPIme (MAXIPIME) 2 g in sodium chloride 0.9 % 100 mL IVPB (0 g Intravenous Stopped 07/15/2017 1346)  dextrose 50 % solution 25 mL (25 mLs Intravenous Given 07/11/2017 1408)  sodium chloride 0.9 % bolus 500 mL (0 mLs Intravenous Stopped 07/20/2017 1714)  sodium chloride 0.9 % bolus 500 mL (0 mLs Intravenous Stopped 08/06/2017 1825)  Initial Impression / Assessment and Plan / ED Course  I have reviewed the triage vital signs and the nursing notes.  Pertinent labs & imaging results that were available during my care of the patient were reviewed by me and considered in my medical decision making (see chart for details).     62 year old female here with altered mental status and lower back pain.  On arrival, patient is hypotensive and tachycardic.  She is altered.  Concern for sepsis.  Given her ongoing back pain, concern for possible osteomyelitis, epidural abscess.  Given her hallucinations, cannot rule out concomitant meningitis though I suspect this is more so secondary to delirium from sepsis.  Patient given IV Decadron, will give Vanco and cefepime to cover for CNS etiology.  Otherwise, will check broad labs, imaging, and reassess.  30 cc/kg fluid bolus given.  Lab work shows significant acute kidney injury.  Patient is markedly dehydrated.  She has a moderate lactic acidosis.  While urinalysis does show some hematuria and pyuria, I am hesitant to state this is the primary source of infection.  CT without acute abnormality.  Given her primary complaint back pain, feels important to obtain MRI for eval of spinal infection.  Given her altered mental status and hallucinations, with history of brain cancer, reasonable to obtain imaging of the brain as well.  Patient does seem to be improving with fluids and her blood pressure stabilized.  Will admit to stepdown.  Final Clinical Impressions(s) / ED  Diagnoses   Final diagnoses:  Sepsis, due to unspecified organism [facility])  Lumbar back pain  Lactic acidosis  AKI (acute kidney injury) [facility])    ED Discharge Orders    None       Duffy Bruce, MD 08/02/2017 1911

## 2017-07-16 NOTE — ED Notes (Signed)
Lactic was given to RN and MD

## 2017-07-16 NOTE — H&P (Addendum)
TRH H&P   Patient Demographics:    Theresa Morrow, is a 62 y.o. female  MRN: [id number]   DOB - 02-08-1956  Admit Date - 07/17/2017  Outpatient Primary MD for the patient is [facility] [place] [facility]  Referring MD/NP/PA:  Lindell Noe  Outpatient Specialists:   Patient coming from: home  Chief Complaint  Patient presents with  . Hypotension  . Altered Mental Status  . Hallucinations      HPI:    Theresa Morrow  is a 62 y.o. female, w brain tumor in 2007, chronic headache, gerd,  c/o lower back without radiation.  Denies numbness, tingling, weakness, incontinence.  Started Tuesday ( 4/30.2019) while at work, no hx of trauma.  Pt couldn't walk more than 100 feet. Presented to ED on 5/1 and had xray.  5/4 had CT scan L spine.  Today, pt was told to go to ED due to low bp and altered mental status. Glucose was ?70    In Ed,  CT scan abd/ pelvis IMPRESSION: Nonobstructing left renal stones. Postoperative changes as described. Mild lingular infiltrate.  CT brain  IMPRESSION: 1. No acute intracranial abnormality. 2. 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  Na 138, 4.0 Bun 63, Creatinine 2.56 Ast 72, Alt 39, Alk phos 161 T. Bili 1.2  Wbc 15.3, hgb 13.8, Plt 122 Esr 32  Blood culture x2 pending Urinalysis  Wbc 6-10, rbc 21-50,  Urine culture pending  Lactic acid 2.60 Original ekg showed nsr,  At 5 pm, pt tachycardic and asymptomatic EKG showed Afib with RVR at 180   Pt will be admitted for sepsis secondary to ? Lingular infiltrate vs uti and Afib with RVR.    Review of systems:    In addition to the HPI above, No Fever-chills, No Headache, No changes with Vision or hearing, No problems swallowing food or Liquids, No Chest pain, Cough or Shortness of Breath, No Abdominal pain, No Nausea or Vommitting, Bowel  movements are regular, No Blood in stool or Urine, No dysuria, No new skin rashes or bruises, No new joints pains-aches,  No new weakness, tingling, numbness in any extremity, No recent weight gain or loss, No polyuria, polydypsia or polyphagia, No significant Mental Stressors.  A full 10 point Review of Systems was done, except as stated above, all other Review of Systems were negative.   With Past History of the following :    Past Medical History:  Diagnosis Date  . Anemia   . Bile duct stone    liver stones  . Cancer [facility])    brain tumor 2007  . Cholangitis   . Choledocholithiasis   . Chronic headaches   . Complication of anesthesia    low heart rate  . GERD (gastroesophageal reflux disease)   . Hepatomegaly   . Kidney stones   . Obesity   .  Osteoarthritis       Past Surgical History:  Procedure Laterality Date  . ACOUSTIC NEUROMA RESECTION  2007  . CHOLECYSTECTOMY OPEN  1977  . ESOPHAGOGASTRODUODENOSCOPY N/A 08/15/2013   Procedure: ESOPHAGOGASTRODUODENOSCOPY (EGD);  Surgeon: Lafayette Dragon, MD;  Location: [facility];  Service: Endoscopy;  Laterality: N/A;  . KNEE ARTHROSCOPY Left 2004  . ROUX-EN-Y GASTRIC BYPASS  2011   and removed liver stones      Social History:     Social History   Tobacco Use  . Smoking status: Never Smoker  . Smokeless tobacco: Never Used  Substance Use Topics  . Alcohol use: No     Lives - at home  Mobility - walks by self     Family History :     Family History  Problem Relation Age of Onset  . Heart disease Mother   . Gallbladder disease Mother   . Diabetes Mother   . Gallbladder disease Sister   . Gallbladder disease Maternal Grandmother   . Kidney cancer Father   . Colon cancer Neg Hx      Home Medications:   Prior to Admission medications   Medication Sig Start Date End Date Taking? Authorizing Provider  aspirin-acetaminophen-caffeine (EXCEDRIN MIGRAINE) [id number] MG tablet Take 2 tablets by mouth every  6 (six) hours as needed for headache.   Yes [provider]  calcium carbonate (TUMS - DOSED IN MG ELEMENTAL CALCIUM) 500 MG chewable tablet Chew 1 tablet by mouth as needed for indigestion or heartburn.   Yes [provider]  cephALEXin (KEFLEX) 500 MG capsule Take 1 capsule (500 mg total) by mouth 4 (four) times daily. 07/14/17  Yes Recardo Evangelist, PA-C  Cholecalciferol (VITAMIN D PO) Take 1 tablet by mouth daily.   Yes [provider]  HYDROcodone-acetaminophen (NORCO) 10-325 MG tablet Take 1 tablet by mouth every 6 (six) hours as needed for moderate pain.    Yes [provider]  ibuprofen (ADVIL,MOTRIN) 200 MG tablet Take 400 mg by mouth every 6 (six) hours as needed.   Yes [provider]  methocarbamol (ROBAXIN) 500 MG tablet Take 1 tablet (500 mg total) by mouth 2 (two) times daily. 07/11/17  Yes Varney Biles, MD  Multiple Vitamin (MULTI-VITAMINS) TABS Take 1 tablet by mouth daily.   Yes [provider]  naproxen (NAPROSYN) 375 MG tablet Take 1 tablet (375 mg total) by mouth 2 (two) times daily. 07/11/17  Yes Varney Biles, MD  oxyCODONE-acetaminophen (PERCOCET/ROXICET) 5-325 MG tablet Take 1 tablet by mouth every 4 (four) hours as needed for severe pain. 07/14/17  Yes Virgel Manifold, MD  pantoprazole (PROTONIX) 40 MG tablet TAKE 1 TABLET(40 MG) BY MOUTH DAILY 11/16/16  Yes Nandigam, Venia Minks, MD     Allergies:    No Known Allergies   Physical Exam:   Vitals  Blood pressure (!) 108/59, pulse 94, temperature 97.7 F (36.5 C), temperature source Rectal, resp. rate 16, height _0  (1.702 m), weight 82.6 kg (182 lb), SpO2 99 %.   1. General  lying in bed in NAD,    2. Normal affect and insight, Not Suicidal or Homicidal, Awake Alert, Oriented X 3.  3. No F.N deficits, ALL C.Nerves Intact, Strength 5/5 all 4 extremities, Sensation intact all 4 extremities, Plantars down going.  4. Ears and Eyes appear Normal, Conjunctivae clear,  PERRLA. Moist Oral Mucosa.  5. Supple Neck, No JVD, No cervical lymphadenopathy appriciated, No Carotid Bruits.  6. Symmetrical Chest wall movement,  Good air movement bilaterally, CTAB.  7. Irr, irr, s1, s2,   8. Positive Bowel Sounds, Abdomen Soft, No tenderness, No organomegaly appriciated,No rebound -guarding or rigidity.  9.  No Cyanosis, Normal Skin Turgor, No Skin Rash or Bruise.  10. Good muscle tone,  joints appear normal , no effusions, Normal ROM.  11. No Palpable Lymph Nodes in Neck or Axillae     Data Review:    CBC Recent Labs  Lab 07/14/17 1940 07/27/2017 1238  WBC 2.2* 15.3*  HGB 12.6 13.8  HCT 38.9 42.2  PLT 119* 122*  MCV 88.2 87.0  MCH 28.6 28.5  MCHC 32.4 32.7  RDW 15.8* 16.5*  LYMPHSABS 0.5*  --   MONOABS 0.5  --   EOSABS 0.0  --   BASOSABS 0.0  --    ------------------------------------------------------------------------------------------------------------------  Chemistries  Recent Labs  Lab 07/14/17 1940 07/25/2017 1238  NA 141 138  K 3.7 4.0  CL 109 104  CO2 21* 18*  GLUCOSE 94 72  BUN 40* 63*  CREATININE 1.40* 2.56*  CALCIUM 8.5* 8.5*  AST  --  72*  ALT  --  39  ALKPHOS  --  161*  BILITOT  --  1.2   ------------------------------------------------------------------------------------------------------------------ estimated creatinine clearance is 25.2 mL/min (A) (by C-G formula based on SCr of 2.56 mg/dL (H)). ------------------------------------------------------------------------------------------------------------------ No results for input(s): TSH, T4TOTAL, T3FREE, THYROIDAB in the last 72 hours.  Invalid input(s): FREET3  Coagulation profile No results for input(s): INR, PROTIME in the last 168 hours. ------------------------------------------------------------------------------------------------------------------- No results for input(s): DDIMER in the last 72  hours. -------------------------------------------------------------------------------------------------------------------  Cardiac Enzymes No results for input(s): CKMB, TROPONINI, MYOGLOBIN in the last 168 hours.  Invalid input(s): CK ------------------------------------------------------------------------------------------------------------------ No results found for: BNP   ---------------------------------------------------------------------------------------------------------------  Urinalysis    Component Value Date/Time   COLORURINE AMBER (A) 07/29/2017 1510   APPEARANCEUR CLOUDY (A) 07/30/2017 1510   LABSPEC 1.023 07/15/2017 1510   PHURINE 5.0 07/26/2017 1510   GLUCOSEU NEGATIVE 07/26/2017 1510   HGBUR MODERATE (A) 07/22/2017 1510   BILIRUBINUR SMALL (A) 08/07/2017 1510   KETONESUR NEGATIVE 07/24/2017 1510   PROTEINUR 100 (A) 07/12/2017 1510   UROBILINOGEN 4.0 (H) 11/17/2010 0429   NITRITE NEGATIVE 07/26/2017 1510   LEUKOCYTESUR TRACE (A) 07/15/2017 1510    ----------------------------------------------------------------------------------------------------------------   Imaging Results:    Ct Abdomen Pelvis Wo Contrast  Result Date: 08/06/2017 CLINICAL DATA:  Hypotension and abdominal pain EXAM: CT ABDOMEN AND PELVIS WITHOUT CONTRAST TECHNIQUE: Multidetector CT imaging of the abdomen and pelvis was performed following the standard protocol without IV contrast. COMPARISON:  None. FINDINGS: Lower chest: Lung bases are well aerated with the exception of mild lingular infiltrate adjacent to the heart. Hepatobiliary: No focal liver abnormality is seen. Status post cholecystectomy. No biliary dilatation. Pancreas: Unremarkable. No pancreatic ductal dilatation or surrounding inflammatory changes. Spleen: Normal in size without focal abnormality. Adrenals/Urinary Tract: The adrenals are within normal limits. Small nonobstructing stones are noted within the left kidney. The  collecting systems are otherwise within normal limits. The bladder is partially decompressed. Stomach/Bowel: Postsurgical changes are noted in the stomach. A sliding-type hiatal hernia is noted. 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 The appendix is within normal limits. Vascular/Lymphatic: No significant vascular findings are present. No enlarged abdominal or pelvic lymph nodes. Reproductive: Uterus and bilateral adnexa are unremarkable. Other: No abdominal wall hernia or abnormality. No abdominopelvic ascites. Musculoskeletal: Degenerative changes of lumbar spine are noted. IMPRESSION: Nonobstructing left renal stones. Postoperative changes as  described. Mild lingular infiltrate. Electronically Signed   By: Inez Catalina M.D.   On: 07/26/2017 15:49   Ct Head Wo Contrast  Result Date: 07/20/2017 CLINICAL DATA:  Altered level of consciousness over the last 2 days, fatigue, hypotension EXAM: CT HEAD WITHOUT CONTRAST TECHNIQUE: Contiguous axial images were obtained from the base of the skull through the vertex without intravenous contrast. COMPARISON:  MR brain scan of 05/12/2014 FINDINGS: Brain: The ventricular system is normal in size and configuration and the septum is in a normal midline position. The fourth ventricle and basilar cisterns are unremarkable. No hemorrhage, mass lesion, or acute infarction is seen. 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 Vascular: No vascular abnormality is seen on this unenhanced study. Skull: On bone window images, changes of prior left mastoidectomy are noted. No acute calvarial abnormality is seen. Sinuses/Orbits: The paranasal sinuses appear well pneumatized. Other: None. IMPRESSION: 1. No acute intracranial abnormality. 2. 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 Electronically Signed   By: Ivar Drape M.D.   On: 07/24/2017 15:45   Ct Lumbar Spine Wo Contrast  Result Date: 07/14/2017 CLINICAL  DATA:  Initial evaluation for low back pain with bilateral leg pain. EXAM: CT LUMBAR SPINE WITHOUT CONTRAST TECHNIQUE: Multidetector CT imaging of the lumbar spine was performed without intravenous contrast administration. Multiplanar CT image reconstructions were also generated. COMPARISON:  Prior radiograph 07/11/2017. FINDINGS: Segmentation: Normal segmentation. Lowest well-formed disc labeled the L5-S1 level. Alignment: 3 mm retrolisthesis of L2 on L3, with up to 4 mm retrolisthesis of L4 on L5. Findings are likely chronic and facet mediated. Mild levoscoliosis, apex at L3. Vertebrae: Vertebral body heights maintained without evidence for acute or chronic fracture. Chronic reactive endplate changes present about the right aspect of the L3-4 interspace due to scoliotic curvature. No discrete lytic or blastic osseous lesions. Visualized sacrum and pelvis unremarkable. Paraspinal and other soft tissues: No acute paraspinous soft tissue abnormality. Patient status post gastric bypass. Punctate nonobstructive left renal nephrolithiasis. Disc levels: L1-2: Mild diffuse disc bulge, asymmetric to the right. Mild bilateral facet hypertrophy. No significant canal stenosis. Mild right L1 foraminal narrowing. L2-3: Retrolisthesis. Chronic intervertebral disc space narrowing with disc bulge. Mild facet hypertrophy. Resultant mild canal with moderate bilateral L2 foraminal stenosis. L3-4: Chronic intervertebral disc space narrowing with diffuse disc bulge. Associated disc desiccation noted. Moderate facet hypertrophy. Resultant moderate canal with bilateral lateral recess narrowing. Severe right with moderate left L3 foraminal stenosis. L4-5: Anterolisthesis. Diffuse disc bulge, slightly asymmetric to the right. Severe facet arthropathy. Resultant severe canal with bilateral subarticular stenosis. Moderate bilateral L4 foraminal narrowing, slightly worse on the left. L5-S1: Diffuse disc bulge with intervertebral disc space  narrowing. Severe left with moderate right facet arthrosis. Resultant mild left lateral recess narrowing without significant canal stenosis. Moderate left L5 foraminal narrowing. No significant right foraminal encroachment. IMPRESSION: 1. No acute abnormality within the lumbar spine. 2. Levoscoliosis with multilevel degenerative spondylolysis with resultant multilevel canal narrowing as above, moderate at L3-4 and severe at L4-5. 3. Multifactorial degenerative changes with resultant multilevel foraminal narrowing as above. 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 4. Punctate nonobstructive left renal nephrolithiasis. Electronically Signed   By: Jeannine Boga M.D.   On: 07/14/2017 20:24   Dg Chest Port 1 View  Result Date: 08/09/2017 CLINICAL DATA:  Cough. EXAM: PORTABLE  CHEST 1 VIEW COMPARISON:  Chest x-ray dated June 13, 2017. FINDINGS: The heart size and mediastinal contours are within normal limits. Normal pulmonary vascularity. Resolved consolidation in the lingula with residual scarring/atelectasis. No focal consolidation, pleural effusion, or pneumothorax. No acute osseous abnormality. IMPRESSION: No active disease. Electronically Signed   By: Titus Dubin M.D.   On: 07/12/2017 13:11   EKG afib with RVR at 180, nl axis, poor R progression, no st- t changes c/w ischemia.    Assessment & Plan:    Principal Problem:   ARF (acute renal failure) (HCC)   Sepsis (tachy, hypotension, elevation in LA) ddx HCAP vs UTI Blood culture x2 Urine culture Sputum gram stain culture Urine legionella Urine strep antigen Vanco iv, pharmacy to dose,  Cefepime iv pharmacy to dose  Hypotension improved with Fluid Tele Trop I q6h x3 Cortisol   Afib with RVR Tele Trop I q6hx3 Tsh Cardiac echo Rate control with cardizem 42m iv x1  then cardizem Gtt Start aspirin 866mpo qday  ARF Check urine sodium, urine creatinine, urine eosinophils Hydrate with d5 ns iv Check cmp in am  Hypoglycemia Check hga1c Check fsbs q1h x 1 shift then q2h x1 shift then q4h   Back pain MRI L spine  Gerd Cont PPI    DVT Prophylaxis Heparin,  SCDs  AM Labs Ordered, also please review Full Orders  Family Communication: Admission, patients condition and plan of care including tests being ordered have been discussed with the patient who indicate understanding and agree with the plan and Code Status.  Code Status   FULL CODE  Likely DC to  home  Condition GUARDED    Consults called: none  Admission status: inpatient   Time spent in minutes : 45 minutes of critical care   JaJani Gravel.D on 08/01/2017 at 5:01 PM  Between 7am to 7pm - Pager - [phone]. After 7pm go to [email or web address] - password [email or web address] - Office  [phone]

## 2017-07-16 NOTE — Progress Notes (Signed)
Patient seen examined and discussed with RN in person  Admitted earlier on day shift with sepsis presumed secondary to nephrolithiasis hypotension/MODS and new onset atrial fibrillation with RVR  On exam- Vitals:   08/10/2017 2043 07/22/2017 2045 08/10/2017 2100 07/26/2017 2115  BP: 109/70 93/72 100/71 98/66  Pulse: (!) 149 (!) 135 (!) 132 (!) 141  Resp: 16 15 16  (!) 22  Temp:      TempSrc:      SpO2: 96% 96% 97% 96%  Weight:      Height:       Awake alert frail dry mucosa S1-S2 no murmur Anterior chest sounds diminished Abdomen slightly tender No lower extremity edema   lactic acidosis indicative of sepsis versus AKI on admission Called because RN noted patient hypotensive-patient started on amiodarone load plus GTT and weaned off of Cardizem gtt.  Critical care has been consulted but at this time patient seems sick but improving slowly RN to let me know if any further issues and critical care can [place] in if so  Would repeat LFT a.m.--prior history of GB stones and also choledocholithiasis/cholangitis  Family updated at bedside regarding care and questions allowed and answered in full detail  >30 minutes critical care time  Verneita Griffes, MD [facility] ([phone]

## 2017-07-17 ENCOUNTER — Inpatient Hospital Stay (HOSPITAL_COMMUNITY): Payer: BLUE CROSS/BLUE SHIELD

## 2017-07-17 DIAGNOSIS — B9562 Methicillin resistant Staphylococcus aureus infection as the cause of diseases classified elsewhere: Secondary | ICD-10-CM

## 2017-07-17 DIAGNOSIS — R7881 Bacteremia: Secondary | ICD-10-CM

## 2017-07-17 DIAGNOSIS — Z85841 Personal history of malignant neoplasm of brain: Secondary | ICD-10-CM

## 2017-07-17 DIAGNOSIS — A4102 Sepsis due to Methicillin resistant Staphylococcus aureus: Principal | ICD-10-CM

## 2017-07-17 DIAGNOSIS — R109 Unspecified abdominal pain: Secondary | ICD-10-CM

## 2017-07-17 DIAGNOSIS — I351 Nonrheumatic aortic (valve) insufficiency: Secondary | ICD-10-CM

## 2017-07-17 DIAGNOSIS — R441 Visual hallucinations: Secondary | ICD-10-CM

## 2017-07-17 DIAGNOSIS — M25532 Pain in left wrist: Secondary | ICD-10-CM

## 2017-07-17 DIAGNOSIS — B953 Streptococcus pneumoniae as the cause of diseases classified elsewhere: Secondary | ICD-10-CM

## 2017-07-17 DIAGNOSIS — Z79891 Long term (current) use of opiate analgesic: Secondary | ICD-10-CM

## 2017-07-17 DIAGNOSIS — M25531 Pain in right wrist: Secondary | ICD-10-CM

## 2017-07-17 DIAGNOSIS — M25572 Pain in left ankle and joints of left foot: Secondary | ICD-10-CM

## 2017-07-17 DIAGNOSIS — M25571 Pain in right ankle and joints of right foot: Secondary | ICD-10-CM

## 2017-07-17 DIAGNOSIS — L539 Erythematous condition, unspecified: Secondary | ICD-10-CM

## 2017-07-17 DIAGNOSIS — R51 Headache: Secondary | ICD-10-CM

## 2017-07-17 DIAGNOSIS — R6521 Severe sepsis with septic shock: Secondary | ICD-10-CM

## 2017-07-17 DIAGNOSIS — I340 Nonrheumatic mitral (valve) insufficiency: Secondary | ICD-10-CM

## 2017-07-17 DIAGNOSIS — M1990 Unspecified osteoarthritis, unspecified site: Secondary | ICD-10-CM

## 2017-07-17 LAB — CBC
HCT: 33.7 % — ABNORMAL LOW (ref 36.0–46.0)
Hemoglobin: 11 g/dL — ABNORMAL LOW (ref 12.0–15.0)
MCH: 27.6 pg (ref 26.0–34.0)
MCHC: 32.6 g/dL (ref 30.0–36.0)
MCV: 84.7 fL (ref 78.0–100.0)
Platelets: 122 10*3/uL — ABNORMAL LOW (ref 150–400)
RBC: 3.98 MIL/uL (ref 3.87–5.11)
RDW: 16.3 % — ABNORMAL HIGH (ref 11.5–15.5)
WBC: 25.2 10*3/uL — AB (ref 4.0–10.5)

## 2017-07-17 LAB — HEPATITIS PANEL, ACUTE
HCV Ab: <0.1 s/co ratio (ref 0.0–0.9)
HEP A IGM: NEGATIVE
HEP B C IGM: NEGATIVE
HEP B S AG: NEGATIVE

## 2017-07-17 LAB — HIV ANTIBODY (ROUTINE TESTING W REFLEX): HIV Screen 4th Generation wRfx: NONREACTIVE

## 2017-07-17 LAB — GLUCOSE, CAPILLARY
GLUCOSE-CAPILLARY: 148 mg/dL — AB (ref 65–99)
GLUCOSE-CAPILLARY: 145 mg/dL — AB (ref 65–99)
Glucose-Capillary: 88 mg/dL (ref 65–99)
Glucose-Capillary: 131 mg/dL — ABNORMAL HIGH (ref 65–99)
Glucose-Capillary: 116 mg/dL — ABNORMAL HIGH (ref 65–99)

## 2017-07-17 LAB — BLOOD CULTURE ID PANEL (REFLEXED)
Acinetobacter baumannii: NOT DETECTED
CANDIDA ALBICANS: NOT DETECTED
CANDIDA KRUSEI: NOT DETECTED
CANDIDA PARAPSILOSIS: NOT DETECTED
CANDIDA TROPICALIS: NOT DETECTED
Candida glabrata: NOT DETECTED
ENTEROBACTERIACEAE SPECIES: NOT DETECTED
Enterobacter cloacae complex: NOT DETECTED
Enterococcus species: NOT DETECTED
Escherichia coli: NOT DETECTED
HAEMOPHILUS INFLUENZAE: NOT DETECTED
KLEBSIELLA PNEUMONIAE: NOT DETECTED
Klebsiella oxytoca: NOT DETECTED
Listeria monocytogenes: NOT DETECTED
Methicillin resistance: DETECTED — AB
NEISSERIA MENINGITIDIS: NOT DETECTED
Proteus species: NOT DETECTED
Pseudomonas aeruginosa: NOT DETECTED
STREPTOCOCCUS PNEUMONIAE: NOT DETECTED
STREPTOCOCCUS PYOGENES: NOT DETECTED
Serratia marcescens: NOT DETECTED
Staphylococcus aureus (BCID): DETECTED — AB
Staphylococcus species: DETECTED — AB
Streptococcus agalactiae: NOT DETECTED
Streptococcus species: NOT DETECTED

## 2017-07-17 LAB — ECHOCARDIOGRAM COMPLETE
Height: 67 in
Weight: 2912 oz

## 2017-07-17 LAB — CBC WITH DIFFERENTIAL/PLATELET
BASOS PCT: 0 %
Basophils Absolute: 0 10*3/uL (ref 0.0–0.1)
EOS PCT: 0 %
Eosinophils Absolute: 0 10*3/uL (ref 0.0–0.7)
HCT: 35.3 % — ABNORMAL LOW (ref 36.0–46.0)
Hemoglobin: 11.6 g/dL — ABNORMAL LOW (ref 12.0–15.0)
LYMPHS ABS: 1.4 10*3/uL (ref 0.7–4.0)
Lymphocytes Relative: 6 %
MCH: 27.8 pg (ref 26.0–34.0)
MCHC: 32.9 g/dL (ref 30.0–36.0)
MCV: 84.7 fL (ref 78.0–100.0)
MONO ABS: 0.5 10*3/uL (ref 0.1–1.0)
MONOS PCT: 2 %
NEUTROS ABS: 21.8 10*3/uL — AB (ref 1.7–7.7)
Neutrophils Relative %: 92 %
PLATELETS: 129 10*3/uL — AB (ref 150–400)
RBC: 4.17 MIL/uL (ref 3.87–5.11)
RDW: 16.4 % — AB (ref 11.5–15.5)
WBC: 23.7 10*3/uL — AB (ref 4.0–10.5)

## 2017-07-17 LAB — RESPIRATORY PANEL BY PCR
Adenovirus: NOT DETECTED
Bordetella pertussis: NOT DETECTED
CORONAVIRUS 229E-RVPPCR: NOT DETECTED
CORONAVIRUS OC43-RVPPCR: NOT DETECTED
Chlamydophila pneumoniae: NOT DETECTED
Coronavirus HKU1: NOT DETECTED
Coronavirus NL63: NOT DETECTED
INFLUENZA B-RVPPCR: NOT DETECTED
Influenza A: NOT DETECTED
Metapneumovirus: NOT DETECTED
Mycoplasma pneumoniae: NOT DETECTED
PARAINFLUENZA VIRUS 1-RVPPCR: NOT DETECTED
Parainfluenza Virus 2: NOT DETECTED
Parainfluenza Virus 3: NOT DETECTED
Parainfluenza Virus 4: NOT DETECTED
RESPIRATORY SYNCYTIAL VIRUS-RVPPCR: NOT DETECTED
Rhinovirus / Enterovirus: NOT DETECTED

## 2017-07-17 LAB — RAPID URINE DRUG SCREEN, HOSP PERFORMED
Amphetamines: POSITIVE — AB
Barbiturates: NOT DETECTED
Benzodiazepines: NOT DETECTED
Cocaine: NOT DETECTED
OPIATES: POSITIVE — AB
Tetrahydrocannabinol: NOT DETECTED

## 2017-07-17 LAB — C-REACTIVE PROTEIN: CRP: 36.7 mg/dL — AB (ref <1.0)

## 2017-07-17 LAB — CK TOTAL AND CKMB (NOT AT ARMC)
CK, MB: 10.4 ng/mL — AB (ref 0.5–5.0)
RELATIVE INDEX: INVALID (ref 0.0–2.5)
Total CK: 79 U/L (ref 38–234)

## 2017-07-17 LAB — CREATININE, SERUM
Creatinine, Ser: 1.72 mg/dL — ABNORMAL HIGH (ref 0.44–1.00)
GFR, EST AFRICAN AMERICAN: 36 mL/min — AB (ref >60)
GFR, EST NON AFRICAN AMERICAN: 31 mL/min — AB (ref >60)

## 2017-07-17 LAB — CORTISOL: Cortisol, Plasma: 21.6 ug/dL

## 2017-07-17 LAB — STREP PNEUMONIAE URINARY ANTIGEN: Strep Pneumo Urinary Antigen: POSITIVE — AB

## 2017-07-17 LAB — HEPARIN LEVEL (UNFRACTIONATED): Heparin Unfractionated: <0.1 IU/mL — ABNORMAL LOW (ref 0.30–0.70)

## 2017-07-17 LAB — TROPONIN I: TROPONIN I: 0.03 ng/mL — AB (ref <0.03)

## 2017-07-17 MED ORDER — VANCOMYCIN HCL IN DEXTROSE 1-5 GM/200ML-% IV SOLN
1000.0000 mg | INTRAVENOUS | Status: DC
  Administered 2017-07-17: 1000 mg via INTRAVENOUS
  Filled 2017-07-17: qty 200

## 2017-07-17 MED ORDER — HEPARIN (PORCINE) IN NACL 100-0.45 UNIT/ML-% IJ SOLN
1100.0000 [IU]/h | INTRAMUSCULAR | Status: DC
  Administered 2017-07-17: 1100 [IU]/h via INTRAVENOUS
  Filled 2017-07-17: qty 250

## 2017-07-17 MED ORDER — HEPARIN BOLUS VIA INFUSION
4000.0000 [IU] | Freq: Once | INTRAVENOUS | Status: AC
  Administered 2017-07-17: 4000 [IU] via INTRAVENOUS
  Filled 2017-07-17: qty 4000

## 2017-07-17 MED ORDER — HEPARIN BOLUS VIA INFUSION
2500.0000 [IU] | Freq: Once | INTRAVENOUS | Status: AC
  Administered 2017-07-17: 2500 [IU] via INTRAVENOUS
  Filled 2017-07-17: qty 2500

## 2017-07-17 MED ORDER — HEPARIN (PORCINE) IN NACL 100-0.45 UNIT/ML-% IJ SOLN
1500.0000 [IU]/h | INTRAMUSCULAR | Status: DC
  Administered 2017-07-17 – 2017-07-18 (×2): 1350 [IU]/h via INTRAVENOUS
  Filled 2017-07-17: qty 250

## 2017-07-17 MED ORDER — SODIUM CHLORIDE 0.9 % IV SOLN
1.0000 g | Freq: Two times a day (BID) | INTRAVENOUS | Status: DC
  Administered 2017-07-17: 1 g via INTRAVENOUS
  Filled 2017-07-17: qty 1

## 2017-07-17 NOTE — Progress Notes (Signed)
PCCM Interval Note  Patient seen and chart reviewed.  She remains on dilt gtt, no pressors.   She has urinary pneumococcal Ag positive although I do not see a discrete PNA on CXR. Lingula appears to be improved compared with CT abd.   Agree with abx as workup for underlying infxn unfolds.  Appreciate cardiology input.   Please call if we can assist in any way.   Baltazar Apo, MD, PhD 07/17/2017, 10:29 AM [facility] [phone] or if no answer [phone]

## 2017-07-17 NOTE — Consult Note (Addendum)
Cardiology Consultation:   Patient ID: Theresa Morrow; [id number]; 05/26/1955   Admit date: 07/26/2017 Date of Consult: 07/17/2017  Primary Care Provider: [facility] Primary Cardiologist: New, Dr C Primary Electrophysiologist:  n/a   Patient Profile:   Theresa Morrow is a 62 y.o. female with a hx of acoustic neuroma s/p resection, OA, Roux-en-Y gastric bypass, GERD, anemia, but no previous cardiac issues who is being seen today for the evaluation of rapid atrial fib at the request of Dr Maudie Mercury.  History of Present Illness:   Theresa Morrow was being evaluated for lower back pain. The pain started last week. Pain moved to her legs on Saturday and then she started having hallucinations.   In PCP office yesterday, SBP was very low and pt was confused>>ER (no mention of HR).  In ER, pt HR initially 110 and SBP 80s. IVF and IV ABX started for possible sepsis. Initial ECGs SR.  Pt went into rapid atrial fib 5 pm yesterday, HR as high as 200. Pt started on IV Cardizem, currently at 15 mg/hr, subcu heparin q 8 hr. SBP briefly dropped 70s, but stabilized w/ IVF, currently 90s.  WBC were 2.2 on 05/04>>25.2 05/06. No fever. Source of sepsis unknown.  Pt currently seeing spots and ants. No CP or SOB. No palpitations. Not aware of atrial fib. No hx presyncope or syncope.   Cares for a 62 year old. Is able to lift the child without problems prior to acute illness. No hx CP/SOB w/ activity.   Has LE edema, no hx, started Saturday.    Past Medical History:  Diagnosis Date  . Anemia   . Bile duct stone    liver stones  . Cancer [facility])    brain tumor 2007  . Cholangitis   . Choledocholithiasis   . Chronic headaches   . Complication of anesthesia    low heart rate  . GERD (gastroesophageal reflux disease)   . Hepatomegaly   . Kidney stones   . Obesity   . Osteoarthritis     Past Surgical History:  Procedure Laterality Date  . ACOUSTIC NEUROMA RESECTION  2007    . CHOLECYSTECTOMY OPEN  1977  . ESOPHAGOGASTRODUODENOSCOPY N/A 08/15/2013   Procedure: ESOPHAGOGASTRODUODENOSCOPY (EGD);  Surgeon: Lafayette Dragon, MD;  Location: [facility];  Service: Endoscopy;  Laterality: N/A;  . KNEE ARTHROSCOPY Left 2004  . ROUX-EN-Y GASTRIC BYPASS  2011   and removed liver stones     Prior to Admission medications   Medication Sig Start Date End Date Taking? Authorizing Provider  aspirin-acetaminophen-caffeine (EXCEDRIN MIGRAINE) [phone] MG tablet Take 2 tablets by mouth every 6 (six) hours as needed for headache.   Yes [provider]  calcium carbonate (TUMS - DOSED IN MG ELEMENTAL CALCIUM) 500 MG chewable tablet Chew 1 tablet by mouth as needed for indigestion or heartburn.   Yes [provider]  cephALEXin (KEFLEX) 500 MG capsule Take 1 capsule (500 mg total) by mouth 4 (four) times daily. 07/14/17  Yes Recardo Evangelist, PA-C  Cholecalciferol (VITAMIN D PO) Take 1 tablet by mouth daily.   Yes [provider]  HYDROcodone-acetaminophen (NORCO) 10-325 MG tablet Take 1 tablet by mouth every 6 (six) hours as needed for moderate pain.    Yes [provider]  ibuprofen (ADVIL,MOTRIN) 200 MG tablet Take 400 mg by mouth every 6 (six) hours as needed.   Yes [provider]  methocarbamol (ROBAXIN) 500 MG tablet Take 1 tablet (500  mg total) by mouth 2 (two) times daily. 07/11/17  Yes Varney Biles, MD  Multiple Vitamin (MULTI-VITAMINS) TABS Take 1 tablet by mouth daily.   Yes [provider]  naproxen (NAPROSYN) 375 MG tablet Take 1 tablet (375 mg total) by mouth 2 (two) times daily. 07/11/17  Yes Varney Biles, MD  oxyCODONE-acetaminophen (PERCOCET/ROXICET) 5-325 MG tablet Take 1 tablet by mouth every 4 (four) hours as needed for severe pain. 07/14/17  Yes Virgel Manifold, MD  pantoprazole (PROTONIX) 40 MG tablet TAKE 1 TABLET(40 MG) BY MOUTH DAILY 11/16/16  Yes Nandigam, Venia Minks, MD    Inpatient Medications: Scheduled  Meds: . aspirin  325 mg Oral Daily  . heparin  5,000 Units Subcutaneous Q8H  . magic mouthwash  1 mL Oral TID  . methocarbamol  500 mg Oral BID  . pantoprazole  40 mg Oral Daily   Continuous Infusions: . sodium chloride 100 mL/hr at 07/17/17 0839  . ceFEPime (MAXIPIME) IV    . dextrose 5 % and 0.9% NaCl Stopped (07/23/2017 2126)  . diltiazem (CARDIZEM) infusion 15 mg/hr (07/17/17 0018)  . [START ON 07/18/2017] vancomycin     PRN Meds: acetaminophen, morphine injection, morphine injection  Allergies:   No Known Allergies  Social History:   Social History   Socioeconomic History  . Marital status: Married    Spouse name: Not on file  . Number of children: 3  . Years of education: Not on file  . Highest education level: Not on file  Occupational History  . Occupation: bookkeeper  Scientific laboratory technician  . Financial resource strain: Not on file  . Food insecurity:    Worry: Not on file    Inability: Not on file  . Transportation needs:    Medical: Not on file    Non-medical: Not on file  Tobacco Use  . Smoking status: Never Smoker  . Smokeless tobacco: Never Used  Substance and Sexual Activity  . Alcohol use: No  . Drug use: No  . Sexual activity: Not on file  Lifestyle  . Physical activity:    Days per week: Not on file    Minutes per session: Not on file  . Stress: Not on file  Relationships  . Social connections:    Talks on phone: Not on file    Gets together: Not on file    Attends religious service: Not on file    Active member of club or organization: Not on file    Attends meetings of clubs or organizations: Not on file    Relationship status: Not on file  . Intimate partner violence:    Fear of current or ex partner: Not on file    Emotionally abused: Not on file    Physically abused: Not on file    Forced sexual activity: Not on file  Other Topics Concern  . Not on file  Social History Narrative  . Not on file    Family History:   Family History  Problem  Relation Age of Onset  . Heart disease Mother   . Gallbladder disease Mother   . Diabetes Mother   . Gallbladder disease Sister   . Gallbladder disease Maternal Grandmother   . Kidney cancer Father   . Colon cancer Neg Hx    Family Status:  Family Status  Relation Name Status  . Mother  (Not Specified)  . Sister  (Not Specified)  . MGM  (Not Specified)  . Father  (Not Specified)  . Neg  Hx  (Not Specified)    ROS:  Please see the history of present illness.  All other ROS reviewed and negative.     Physical Exam/Data:   Vitals:   07/17/17 0300 07/17/17 0309 07/17/17 0400 07/17/17 0745  BP: (!) 79/43  (!) 86/51   Pulse: (!) 102  (!) 57   Resp: 10  12   Temp:  98.4 F (36.9 C)  98.8 F (37.1 C)  TempSrc:  Axillary  Axillary  SpO2: 95%  93%   Weight:      Height:        Intake/Output Summary (Last 24 hours) at 07/17/2017 0843 Last data filed at 07/17/2017 0745 Gross per 24 hour  Intake 4325.58 ml  Output 800 ml  Net 3525.58 ml   Filed Weights   08/04/2017 1234  Weight: 182 lb (82.6 kg)   Body mass index is 28.51 kg/m.  General:  Well nourished, well developed, in no acute distress HEENT: normal Lymph: no adenopathy Neck: JVD 9 cm Endocrine:  No thryomegaly Vascular: No carotid bruits; 4/4 extremity pulses 2+, without bruits  Cardiac:  normal S1, S2; Irreg R&R; no murmur  Lungs:  Few wheezes w/ rare rhonchi bilaterally, no rales  Abd: soft, nontender, no hepatomegaly  Ext: 1-2+ edema Musculoskeletal:  No deformities, BUE and BLE strength weak w/ LLE a little weaker than RLE Skin: warm and dry  Neuro:  CNs 2-12 intact, no focal abnormalities noted Psych:  Flat affect   EKG:  The EKG was personally reviewed and demonstrates:  SR initially, at 18:20 on 05/06, Atrial fib, HR 185 Telemetry:  Telemetry was personally reviewed and demonstrates:  Atrial fib, RVR w/ gradual improvement in rate overnight.  Relevant CV Studies:  ECHO: ordered  Laboratory  Data:  Chemistry Recent Labs  Lab 07/14/17 1940 08/04/2017 1238 08/10/2017 2032 07/23/2017 2248 07/17/17 0443  NA 141 138  --  138  --   K 3.7 4.0  --  3.8  --   CL 109 104  --  111  --   CO2 21* 18*  --  17*  --   GLUCOSE 94 72  --  148*  --   BUN 40* 63*  --  56*  --   CREATININE 1.40* 2.56* 2.02* 1.83* 1.72*  CALCIUM 8.5* 8.5*  --  7.7*  --   GFRNONAA 39* 19* 25* 28* 31*  GFRAA 46* 22* 29* 33* 36*  ANIONGAP 11 16*  --  10  --     Lab Results  Component Value Date   ALT 32 07/15/2017   AST 49 (H) 07/29/2017   ALKPHOS 129 (H) 07/12/2017   BILITOT 1.1 07/31/2017   Hematology Recent Labs  Lab 07/31/2017 1238 07/14/2017 2248 07/17/17 0443  WBC 15.3* 23.7* 25.2*  RBC 4.85 4.17 3.98  HGB 13.8 11.6* 11.0*  HCT 42.2 35.3* 33.7*  MCV 87.0 84.7 84.7  MCH 28.5 27.8 27.6  MCHC 32.7 32.9 32.6  RDW 16.5* 16.4* 16.3*  PLT 122* 129* 122*   Cardiac Enzymes Recent Labs  Lab 07/20/2017 1733 07/26/2017 2248 07/17/17 0443  TROPONINI <0.03 0.03* 0.03*     BNPNo results for input(s): BNP, PROBNP in the last 168 hours.  DDimer  Recent Labs  Lab 08/06/2017 1748  DDIMER 7.82*   TSH:  Lab Results  Component Value Date   TSH 0.465 07/13/2017   Magnesium:  Magnesium  Date Value Ref Range Status  07/11/2017 1.9 1.7 - 2.4 mg/dL Final  Comment:    Performed at [facility], [place] [address], [place], [place] [address]     Radiology/Studies:  Ct Abdomen Pelvis Wo Contrast  Result Date: 07/29/2017 CLINICAL DATA:  Hypotension and abdominal pain EXAM: CT ABDOMEN AND PELVIS WITHOUT CONTRAST TECHNIQUE: Multidetector CT imaging of the abdomen and pelvis was performed following the standard protocol without IV contrast. COMPARISON:  None. FINDINGS: Lower chest: Lung bases are well aerated with the exception of mild lingular infiltrate adjacent to the heart. Hepatobiliary: No focal liver abnormality is seen. Status post cholecystectomy. No biliary dilatation. Pancreas:  Unremarkable. No pancreatic ductal dilatation or surrounding inflammatory changes. Spleen: Normal in size without focal abnormality. Adrenals/Urinary Tract: The adrenals are within normal limits. Small nonobstructing stones are noted within the left kidney. The collecting systems are otherwise within normal limits. The bladder is partially decompressed. Stomach/Bowel: Postsurgical changes are noted in the stomach. A sliding-type hiatal hernia is noted. 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 The appendix is within normal limits. Vascular/Lymphatic: No significant vascular findings are present. No enlarged abdominal or pelvic lymph nodes. Reproductive: Uterus and bilateral adnexa are unremarkable. Other: No abdominal wall hernia or abnormality. No abdominopelvic ascites. Musculoskeletal: Degenerative changes of lumbar spine are noted. IMPRESSION: Nonobstructing left renal stones. Postoperative changes as described. Mild lingular infiltrate. Electronically Signed   By: Inez Catalina M.D.   On: 07/24/2017 15:49   Ct Head Wo Contrast  Result Date: 08/05/2017 CLINICAL DATA:  Altered level of consciousness over the last 2 days, fatigue, hypotension EXAM: CT HEAD WITHOUT CONTRAST TECHNIQUE: Contiguous axial images were obtained from the base of the skull through the vertex without intravenous contrast. COMPARISON:  MR brain scan of 05/12/2014 FINDINGS: Brain: The ventricular system is normal in size and configuration and the septum is in a normal midline position. The fourth ventricle and basilar cisterns are unremarkable. No hemorrhage, mass lesion, or acute infarction is seen. 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 Vascular: No vascular abnormality is seen on this unenhanced study. Skull: On bone window images, changes of prior left mastoidectomy are noted. No acute calvarial abnormality is seen. Sinuses/Orbits: The paranasal sinuses appear well pneumatized. Other: None.  IMPRESSION: 1. No acute intracranial abnormality. 2. 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 Electronically Signed   By: Ivar Drape M.D.   On: 07/12/2017 15:45   Ct Lumbar Spine Wo Contrast  Result Date: 07/14/2017 CLINICAL DATA:  Initial evaluation for low back pain with bilateral leg pain. EXAM: CT LUMBAR SPINE WITHOUT CONTRAST TECHNIQUE: Multidetector CT imaging of the lumbar spine was performed without intravenous contrast administration. Multiplanar CT image reconstructions were also generated. COMPARISON:  Prior radiograph 07/11/2017. FINDINGS: Segmentation: Normal segmentation. Lowest well-formed disc labeled the L5-S1 level. Alignment: 3 mm retrolisthesis of L2 on L3, with up to 4 mm retrolisthesis of L4 on L5. Findings are likely chronic and facet mediated. Mild levoscoliosis, apex at L3. Vertebrae: Vertebral body heights maintained without evidence for acute or chronic fracture. Chronic reactive endplate changes present about the right aspect of the L3-4 interspace due to scoliotic curvature. No discrete lytic or blastic osseous lesions. Visualized sacrum and pelvis unremarkable. Paraspinal and other soft tissues: No acute paraspinous soft tissue abnormality. Patient status post gastric bypass. Punctate nonobstructive left renal nephrolithiasis. Disc levels: L1-2: Mild diffuse disc bulge, asymmetric to the right. Mild bilateral facet hypertrophy. No significant canal stenosis. Mild right L1 foraminal narrowing. L2-3: Retrolisthesis. Chronic  intervertebral disc space narrowing with disc bulge. Mild facet hypertrophy. Resultant mild canal with moderate bilateral L2 foraminal stenosis. L3-4: Chronic intervertebral disc space narrowing with diffuse disc bulge. Associated disc desiccation noted. Moderate facet hypertrophy. Resultant moderate canal with bilateral lateral recess narrowing. Severe right with moderate left L3 foraminal stenosis. L4-5: Anterolisthesis. Diffuse  disc bulge, slightly asymmetric to the right. Severe facet arthropathy. Resultant severe canal with bilateral subarticular stenosis. Moderate bilateral L4 foraminal narrowing, slightly worse on the left. L5-S1: Diffuse disc bulge with intervertebral disc space narrowing. Severe left with moderate right facet arthrosis. Resultant mild left lateral recess narrowing without significant canal stenosis. Moderate left L5 foraminal narrowing. No significant right foraminal encroachment. IMPRESSION: 1. No acute abnormality within the lumbar spine. 2. Levoscoliosis with multilevel degenerative spondylolysis with resultant multilevel canal narrowing as above, moderate at L3-4 and severe at L4-5. 3. Multifactorial degenerative changes with resultant multilevel foraminal narrowing as above. 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 4. Punctate nonobstructive left renal nephrolithiasis. Electronically Signed   By: Jeannine Boga M.D.   On: 07/14/2017 20:24   Mr Brain Wo Contrast  Result Date: 07/17/2017 CLINICAL DATA:  Worsening mental status. 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 EXAM: MRI HEAD WITHOUT CONTRAST TECHNIQUE: Multiplanar, multiecho pulse sequences of the brain and surrounding structures were obtained without intravenous contrast. COMPARISON:  Head CT 08/02/2017.  MRI 05/12/2014. FINDINGS: Brain: Diffusion imaging does not show any acute or subacute infarction. There has been previous left mastoidectomy which appears the same as on previous exams. No evidence of any abnormality of the brainstem, cerebellum or cerebral hemispheres. No old infarction, mass lesion, hemorrhage, hydrocephalus or extra-axial collection. Vascular: Major vessels at the base of the brain show flow. Skull and upper cervical spine: Otherwise negative.  Sinuses/Orbits: Clear/normal Other: None IMPRESSION: No acute or reversible finding. No abnormality seen to explain worsening mental status. Distant left mastoidectomy without complicating feature. Electronically Signed   By: Nelson Chimes M.D.   On: 07/17/2017 07:01   Mr Thoracic Spine Wo Contrast  Result Date: 07/17/2017 CLINICAL DATA:  Altered mental status. Sepsis with elevated white blood cell count. Possible meningitis. EXAM: MRI THORACIC AND LUMBAR SPINE WITHOUT CONTRAST TECHNIQUE: Multiplanar and multiecho pulse sequences of the thoracic and lumbar spine were obtained without intravenous contrast. COMPARISON:  CT abdomen and pelvis 07/29/2017. FINDINGS: MRI THORACIC SPINE FINDINGS Alignment: There is exaggeration of the normal thoracic kyphosis. No listhesis. Vertebrae: Vertebral body height and signal are maintained. No evidence of discitis or osteomyelitis. No fracture or worrisome lesion. Cord:  Normal signal throughout. Paraspinal and other soft tissues: A 0.8 cm nodular opacity is seen posteriorly in the right lung on image 12 of series 8. Small bilateral pleural effusions are identified. The patient also has a small hiatal hernia. Disc levels: The central spinal canal and neural foramina are patent at all levels. Small central disc protrusion at T7-8 is noted. MRI LUMBAR SPINE FINDINGS Segmentation:  Standard. Alignment: Trace retrolisthesis L2 on L3 is identified. There is convex left scoliosis. Vertebrae: No fracture or worrisome lesion. No evidence of infectious process. Conus medullaris and cauda equina: Conus extends to the L1-2 level. Conus and cauda equina appear normal. Paraspinal and other soft tissues: Negative. Disc levels: L1-2: Minimal disc bulge without stenosis. L2-3: Shallow disc bulge is identified. Loss of disc space height is noted. The central canal is  mildly narrowed. The foramina are open. L3-4: There is loss of disc space height, a shallow disc bulge and vacuum disc phenomenon.  The central canal and foramina remain open. L4-5: Bilateral facet degenerative change with associated small effusions. Shallow disc bulge. The central canal and foramina are open. L5-S1: Shallow disc bulge without stenosis. IMPRESSION: MR THORACIC SPINE IMPRESSION No acute abnormality.  Negative for source of infection. 0.7 cm nodular opacity in the right lung cannot be definitively characterized. Recommend CT chest in 6 months to ensure stability. MR LUMBAR SPINE IMPRESSION No acute abnormality.  Negative for source of infection. Mild central canal narrowing L2-3 due to a shallow disc bulge. Scattered facet arthropathy appearing worst at L4-5. Electronically Signed   By: Inge Rise M.D.   On: 07/17/2017 08:06   Mr Lumbar Spine Wo Contrast  Result Date: 07/17/2017 CLINICAL DATA:  Altered mental status. Sepsis with elevated white blood cell count. Possible meningitis. EXAM: MRI THORACIC AND LUMBAR SPINE WITHOUT CONTRAST TECHNIQUE: Multiplanar and multiecho pulse sequences of the thoracic and lumbar spine were obtained without intravenous contrast. COMPARISON:  CT abdomen and pelvis 07/26/2017. FINDINGS: MRI THORACIC SPINE FINDINGS Alignment: There is exaggeration of the normal thoracic kyphosis. No listhesis. Vertebrae: Vertebral body height and signal are maintained. No evidence of discitis or osteomyelitis. No fracture or worrisome lesion. Cord:  Normal signal throughout. Paraspinal and other soft tissues: A 0.8 cm nodular opacity is seen posteriorly in the right lung on image 12 of series 8. Small bilateral pleural effusions are identified. The patient also has a small hiatal hernia. Disc levels: The central spinal canal and neural foramina are patent at all levels. Small central disc protrusion at T7-8 is noted. MRI LUMBAR SPINE FINDINGS Segmentation:  Standard. Alignment: Trace retrolisthesis L2 on L3 is identified. There is convex left scoliosis. Vertebrae: No fracture or worrisome lesion. No evidence  of infectious process. Conus medullaris and cauda equina: Conus extends to the L1-2 level. Conus and cauda equina appear normal. Paraspinal and other soft tissues: Negative. Disc levels: L1-2: Minimal disc bulge without stenosis. L2-3: Shallow disc bulge is identified. Loss of disc space height is noted. The central canal is mildly narrowed. The foramina are open. L3-4: There is loss of disc space height, a shallow disc bulge and vacuum disc phenomenon. The central canal and foramina remain open. L4-5: Bilateral facet degenerative change with associated small effusions. Shallow disc bulge. The central canal and foramina are open. L5-S1: Shallow disc bulge without stenosis. IMPRESSION: MR THORACIC SPINE IMPRESSION No acute abnormality.  Negative for source of infection. 0.7 cm nodular opacity in the right lung cannot be definitively characterized. Recommend CT chest in 6 months to ensure stability. MR LUMBAR SPINE IMPRESSION No acute abnormality.  Negative for source of infection. Mild central canal narrowing L2-3 due to a shallow disc bulge. Scattered facet arthropathy appearing worst at L4-5. Electronically Signed   By: Inge Rise M.D.   On: 07/17/2017 08:06   Dg Chest Port 1 View  Result Date: 07/29/2017 CLINICAL DATA:  Cough. EXAM: PORTABLE CHEST 1 VIEW COMPARISON:  Chest x-ray dated June 13, 2017. FINDINGS: The heart size and mediastinal contours are within normal limits. Normal pulmonary vascularity. Resolved consolidation in the lingula with residual scarring/atelectasis. No focal consolidation, pleural effusion, or pneumothorax. No acute osseous abnormality. IMPRESSION: No active disease. Electronically Signed   By: Titus Dubin M.D.   On: 07/20/2017 13:11    Assessment and Plan:   1.  Atrial fibrillation with RVR ([place]) -  rate has improved w/ Cardizem - continue this - on subcu heparin, pt would be candidate for anticoag,  - ?change to heparin gtt if pt does not spontaneously convert to  SR - has been in afib < 24 hr, could do DCCV w/out TEE if needed - CHA2DS2-VASc = 1 (female) - Not sure she needs ongoing anticoag - however, pt not aware of arrhythmia, may need event monitor as outpt - echo ordered, onset of LE 3 days ago  Principal Problem: 2.  ARF (acute renal failure) ([place]) - improving w/ hydration - GFR was 39 on 05/04, previous labs from 2012 were normal  Active Problems: 2.  Sepsis ([place]) - strep pneumo urinary antigen + - blood cx neg so far, CXR w/out acute abnormality - neg for flu - MRI and CT scans negative for cause   Otherwise, per IM   UTI (urinary tract infection)   CAP (community acquired pneumonia)   AKI (acute kidney injury) ([place])   Lactic acidosis   Lumbar back pain     For questions or updates, please contact [facility] Please consult [email or web address] for contact info under Cardiology/STEMI.   Signed, Rosaria Ferries, PA-C  07/17/2017 8:43 AM  I have seen and examined the patient along with Rosaria Ferries, PA-C .  I have reviewed the chart, notes and new data.  I agree with PA's note.  Key new complaints: oblivious to arrhythmia Key examination changes: no overt clinical evidence of structural cardiac abnormalities Key new findings / data: echo pending, Strep pneumo +ve  PLAN: Acute onset paroxysmal atrial fibrillation with RVR in setting of critical illness (bactrerial sepsis). CHADSVasc 0-1 (gender only). Anticipate she will return to normal rhythm as infection comes under control. Will not require long term anticoagulation, but I would favor starting IV heparin: if she does not spontaneously return to normal rhythm, this would simplify the approach to DCCV (no need for TEE if she has been anticoagulated). She hates the food. Will switch to a regular diet.  Sanda Klein, MD, [place] [phone] 07/17/2017, 10:47 AM

## 2017-07-17 NOTE — Progress Notes (Signed)
[facility] Infusion Coordinator will follow pt with ID team to support Home Infusion services for home IV ABX as ordered/needed.  If patient discharges after hours, please call [phone].   Larry Sierras 07/17/2017, 11:37 PM

## 2017-07-17 NOTE — Progress Notes (Signed)
      INFECTIOUS DISEASE ATTENDING ADDENDUM:   Date: 07/17/2017  Patient name: Theresa Morrow  Medical record number: [id number]  Date of birth: 12-22-1955        [facility] Antimicrobial Management Team Staphylococcus aureus bacteremia   Staphylococcus aureus bacteremia (SAB) is associated with a high rate of complications and mortality.  Specific aspects of clinical management are critical to optimizing the outcome of patients with SAB.  Therefore, the [facility] Antimicrobial Management Team [facility]) has initiated an intervention aimed at improving the management of SAB at [facility].  To do so, Infectious Diseases physicians are providing an evidence-based consult for the management of all patients with SAB.     Yes No Comments  Perform follow-up blood cultures (even if the patient is afebrile) to ensure clearance of bacteremia [x]  []    Remove vascular catheter and obtain follow-up blood cultures after the removal of the catheter [x]  []  IF POSSIBLE DO NOT PLACE CENTRAL LINE UNTIL WE HAVE CLEARED HER BACTEREMIA  Perform echocardiography to evaluate for endocarditis (transthoracic ECHO is 40-50% sensitive, TEE is > 90% sensitive) [x]  []  Please keep in mind, that neither test can definitively EXCLUDE endocarditis, and that should clinical suspicion remain high for endocarditis the patient should then still be treated with an "endocarditis" duration of therapy = 6 weeks  Consult electrophysiologist to evaluate implanted cardiac device (pacemaker, ICD) []  []    Ensure source control [x]  []  Have all abscesses been drained effectively? Have deep seeded infections (septic joints or osteomyelitis) had appropriate surgical debridement?  SOURCE NOT CLEAR  Investigate for "metastatic" sites of infection []  []  Does the patient have ANY symptom or physical exam finding that would suggest a deeper infection (back or neck pain that may be suggestive of vertebral osteomyelitis or epidural abscess, muscle  pain that could be a symptom of pyomyositis)?  Keep in mind that for deep seeded infections MRI imaging with contrast is preferred rather than other often insensitive tests such as plain x-rays, especially early in a patient's presentation.  MRI brain, T and L spine, CXR, CT not showing clear cut source other than lingular infiltrate, nor clear cut metastatic site of infection  Change antibiotic therapy to vancomycin but if renal fx worsens again go to daptomycin  [x]  []  Beta-lactam antibiotics are preferred for MSSA due to higher cure rates.   If on Vancomycin, goal trough should be 15 - 20 mcg/mL  Estimated duration of IV antibiotic therapy:  4-6 weeks [x]  []  Consult case management for probably prolonged outpatient IV antibiotic therapy   Note she also has pnumococcal ag in urine. Would seem strange for her to have MRSAB and pneumococcal infection. Vancomycin should treat both. I am dc cefepime.  Will see formally this afternoon.   Alcide Evener 07/17/2017, 11:51 AM

## 2017-07-17 NOTE — Progress Notes (Signed)
ANTICOAGULATION CONSULT NOTE - Initial Consult  Pharmacy Consult for Heparin Indication: atrial fibrillation  No Known Allergies  Patient Measurements: Height: 5\' 7"  (170.2 cm) Weight: 182 lb (82.6 kg) IBW/kg (Calculated) : 61.6 Heparin Dosing Weight: 78.7 kg  Vital Signs: Temp: 98.8 F (37.1 C) (05/07 0745) Temp Source: Axillary (05/07 0745) BP: 98/57 (05/07 0800) Pulse Rate: 134 (05/07 0800)  Labs: Recent Labs    07/28/2017 1238 07/18/2017 1733 07/13/2017 2032 07/15/2017 2248 07/17/17 0443  HGB 13.8  --   --  11.6* 11.0*  HCT 42.2  --   --  35.3* 33.7*  PLT 122*  --   --  129* 122*  APTT  --   --   --  37*  --   LABPROT  --   --   --  14.4  --   INR  --   --   --  1.13  --   CREATININE 2.56*  --  2.02* 1.83* 1.72*  CKTOTAL  --   --  79  --   --   CKMB  --   --  10.4*  --   --   TROPONINI  --  <0.03  --  0.03* 0.03*    Estimated Creatinine Clearance: 37.5 mL/min (A) (by C-G formula based on SCr of 1.72 mg/dL (H)).   Medical History: Past Medical History:  Diagnosis Date  . Anemia   . Bile duct stone    liver stones  . Cancer [facility])    brain tumor 2007  . Cholangitis   . Choledocholithiasis   . Chronic headaches   . Complication of anesthesia    low heart rate  . GERD (gastroesophageal reflux disease)   . Hepatomegaly   . Kidney stones   . Obesity   . Osteoarthritis     Medications:  Scheduled:  . aspirin  325 mg Oral Daily  . magic mouthwash  1 mL Oral TID  . methocarbamol  500 mg Oral BID  . pantoprazole  40 mg Oral Daily   Infusions:  . sodium chloride 100 mL/hr at 07/17/17 0839  . ceFEPime (MAXIPIME) IV 1 g (07/17/17 0941)  . dextrose 5 % and 0.9% NaCl Stopped (07/27/2017 2126)  . diltiazem (CARDIZEM) infusion 15 mg/hr (07/17/17 0950)  . [START ON 07/18/2017] vancomycin      Assessment: 61 yoF admitted on 5/6 with hypotension, AMS, hallucinations.  Currently being treated for sepsis, pneumonia, and bacteremia.  She also developed new onset Afib  RVR, first on 5/6.  Pharmacy is consulted to dose Heparin IV.   Baseline INR 1.13, APTT 37 (5/6)  CBC: Hgb 11 (decreased from 11.6), Plt 122 (near baseline ~120s) SCr slowly improving to 1.7 No bleeding or complications reported.  Goal of Therapy:  Heparin level 0.3-0.7 units/ml Monitor platelets by anticoagulation protocol: Yes   Plan:  Give heparin 4000 units bolus IV x 1 Start heparin IV infusion at 1100 units/hr Heparin level 6 hours after starting Daily heparin level and CBC Continue to monitor H&H and platelets   Gretta Arab PharmD, BCPS Pager [phone] 07/17/2017 11:37 AM

## 2017-07-17 NOTE — Consult Note (Addendum)
Date of Admission:  07/18/2017          Reason for Consult: MRSA bacteremia with septic shock   Referring Provider: Auto consult and Dr. Maudie Mercury   Assessment:  1. MRSA bacteremia with septic shock with unclear source 2. Rule out endocarditis 3. Possible bilateral septic ankles and myositis of calves 4. Wrist pain bilaterally 5. ARF 6. AF with RVR 7. + pneumococcal ag in urine (would seem VERY odd for her to have 2 such virulent organisms at same time but both are covered by vancomycin  Plan: 1. IV vancomycin (DC cefepime)  but change to dapto if renal function goes wrong direction 2. Blood cultures to ensure clearance of bacteremia 3. Given transthoracic echocardiogram was not definitive we need a transesophageal echocardiogram to look for infectious endocarditis 4. She will need both of her ankles and calves imaged with MRIs. 5. We may also do need to image her wrists 6. If orthopedic surgery could aspirate her ankle joints and sent for cell count and differential with crystal analysis that also could be helpful 7. Please do not place a central line or PICC line until we have ensured clearance of her bacteremia   Principal Problem:   ARF (acute renal failure) ([place]) Active Problems:   Atrial fibrillation with RVR (H[facility])   Sepsis ([place])   UTI (urinary tract infection)   CAP (community acquired pneumonia)   AKI (acute kidney injury) ([place])   Lactic acidosis   Lumbar back pain   Scheduled Meds: . aspirin  325 mg Oral Daily  . magic mouthwash  1 mL Oral TID  . methocarbamol  500 mg Oral BID  . pantoprazole  40 mg Oral Daily   Continuous Infusions: . dextrose 5 % and 0.9% NaCl Stopped (07/31/2017 2126)  . diltiazem (CARDIZEM) infusion 10 mg/hr (07/17/17 1612)  . heparin 1,100 Units/hr (07/17/17 1400)  . vancomycin Stopped (07/17/17 1341)   PRN Meds:.acetaminophen, morphine injection, morphine injection  HPI: Theresa Morrow is a 62 y.o. female w brain tumor in 2007, chronic  headache, developed severe low back pain more than a week ago.  c/o lower back without radiation.  Denies numbness, tingling, weakness, incontinence.  Started Tuesday ( 4/30.2019) while at work, no hx of trauma.  Pt couldn't walk more than 100 feet. Presented to ED on 5/1 and had xray. She was rx robaxin and NSAID. She returned on 5/4 and   5/4 with bilateral pain in her ankles right greater than left with an erythematous area in her left lower extremity.  ER evaluated her and thought that she might have cellulitis and prescribed Keflex.  She was also ruled out for deep venous thrombosis.  had CT scan L spine.  When she followed up with her primary care physician on Monday she was found to be severely hypotensive and directed to the emergency department.  She was becoming increasingly confused and suffering from visual hallucinations seeing ants running up and down the walls seeing butterflies seeing different bright colors on the clothing of different healthcare workers.  In the ER she had evidence of septic shock with hypotension and lactic acidosis and acute renal failure.  She also had onset of new onset atrial fibrillation with a rapid ventricular response.  She has been anticoagulated with heparin and rate controlled with diltiazem after her heart rate went into the 200s.  Blood cultures were obtained along with a chest x-ray, urinalysis was performed she was started on vancomycin and cefepime.  She was now also complaining of severe abdominal pain along with her low back pain and a CT of the abdomen and pelvis was performed without intravenous contrast and which was unrevealing.  CT of the head was also unremarkable.  She is subsequently had imaging today with an MRI of the brain and MRI of the lumbar and thoracic spine which have failed to show any evidence of infection.  Her blood cultures turned positive for methicillin resistant Staphylococcus aureus today by the B CID identification system.  Curiously  her urine Legionella antigen is also positive.  She has been on chronic opiates for severe osteoarthritis but denies any history of intravenous drug use.  She has not had a history of boils or furuncles or cuts in the skin that she can recall.  Currently she has multiple areas of pain including her lower back or lower abdomen as well as her ankles which are exquisitely tender to palpation.  She also has exquisite tenderness to palpation of her calf bilaterally.  Wrists are also tender though not to the degree seen in her lower extremities.  She was having visual hallucinations while I was interviewing her saying that I was turning into a butterfly.  Beyond her visual hallucinations however she seems fairly lucid to me.    Review of Systems: Review of Systems  Constitutional: Positive for diaphoresis, fever and malaise/fatigue. Negative for chills and weight loss.  HENT: Negative for congestion, hearing loss, sore throat and tinnitus.   Eyes: Negative for blurred vision and double vision.  Respiratory: Negative for cough, sputum production, shortness of breath and wheezing.   Cardiovascular: Positive for chest pain, palpitations and leg swelling.  Gastrointestinal: Positive for abdominal pain. Negative for blood in stool, constipation, diarrhea, heartburn, melena, nausea and vomiting.  Genitourinary: Negative for dysuria, flank pain and hematuria.  Musculoskeletal: Positive for back pain, joint pain, myalgias and neck pain. Negative for falls.  Skin: Positive for rash. Negative for itching.  Neurological: Positive for dizziness, weakness and headaches. Negative for sensory change, focal weakness and loss of consciousness.  Endo/Heme/Allergies: Does not bruise/bleed easily.  Psychiatric/Behavioral: Positive for hallucinations. Negative for depression, memory loss and suicidal ideas. The patient is not nervous/anxious.     Past Medical History:  Diagnosis Date  . Anemia   . Bile duct stone     liver stones  . Cancer [facility])    brain tumor 2007  . Cholangitis   . Choledocholithiasis   . Chronic headaches   . Complication of anesthesia    low heart rate  . GERD (gastroesophageal reflux disease)   . Hepatomegaly   . Kidney stones   . Obesity   . Osteoarthritis     Social History   Tobacco Use  . Smoking status: Never Smoker  . Smokeless tobacco: Never Used  Substance Use Topics  . Alcohol use: No  . Drug use: No    Family History  Problem Relation Age of Onset  . Heart disease Mother   . Gallbladder disease Mother   . Diabetes Mother   . Gallbladder disease Sister   . Gallbladder disease Maternal Grandmother   . Kidney cancer Father   . Colon cancer Neg Hx    No Known Allergies  OBJECTIVE: Blood pressure 103/72, pulse 96, temperature 97.9 F (36.6 C), temperature source Oral, resp. rate (!) 9, height 5\' 7"  (1.702 m), weight 182 lb (82.6 kg), SpO2 96 %.  Physical Exam  Constitutional: She is oriented to person, place, and  time. No distress.  HENT:  Head: Normocephalic and atraumatic.  Right Ear: External ear normal.  Left Ear: External ear normal.  Nose: Nose normal.  Mouth/Throat: Oropharynx is clear and moist. No oropharyngeal exudate.  Eyes: Conjunctivae and EOM are normal. No scleral icterus.  Neck: Normal range of motion. Neck supple.  Cardiovascular: Normal heart sounds. An irregularly irregular rhythm present. Exam reveals no gallop and no friction rub.  No murmur heard. Pulmonary/Chest: Effort normal and breath sounds normal. No respiratory distress. She has no wheezes. She has no rales.  Abdominal: Soft. Bowel sounds are normal. She exhibits no distension. There is no tenderness. There is no rebound.  Musculoskeletal: She exhibits edema and tenderness.       Right wrist: She exhibits tenderness.       Left wrist: She exhibits tenderness.       Right hip: Normal. She exhibits no tenderness and no bony tenderness.       Left hip: Normal. She  exhibits normal range of motion and normal strength.       Right ankle: She exhibits decreased range of motion and swelling. Tenderness.       Left ankle: She exhibits decreased range of motion and swelling. Tenderness.       Legs: Lymphadenopathy:    She has no cervical adenopathy.  Neurological: She is alert and oriented to person, place, and time. Coordination normal.  Skin: Skin is warm and dry. No rash noted. She is not diaphoretic. No erythema. No pallor.  Psychiatric: Judgment normal.   Skin 5/7 erythema over left ankle 2 > right 019:  :      Lab Results Lab Results  Component Value Date   WBC 25.2 (H) 07/17/2017   HGB 11.0 (L) 07/17/2017   HCT 33.7 (L) 07/17/2017   MCV 84.7 07/17/2017   PLT 122 (L) 07/17/2017    Lab Results  Component Value Date   CREATININE 1.72 (H) 07/17/2017   BUN 56 (H) 08/07/2017   NA 138 08/03/2017   K 3.8 08/09/2017   CL 111 07/31/2017   CO2 17 (L) 07/20/2017    Lab Results  Component Value Date   ALT 32 07/15/2017   AST 49 (H) 08/07/2017   ALKPHOS 129 (H) 07/22/2017   BILITOT 1.1 07/12/2017     Microbiology: Recent Results (from the past 240 hour(s))  Blood Culture (routine x 2)     Status: None (Preliminary result)   Collection Time: 07/27/2017  1:26 PM  Result Value Ref Range Status   Specimen Description   Final    RIGHT ANTECUBITAL Performed at [facility], [place] [address] [place], [place] [address]    Special Requests   Final    BOTTLES DRAWN AEROBIC AND ANAEROBIC Blood Culture adequate volume Performed at [place] [address] [place], [place] [address]    Culture  Setup Time   Final    GRAM POSITIVE COCCI IN BOTH AEROBIC AND ANAEROBIC BOTTLES CRITICAL RESULT CALLED TO, READ BACK BY AND VERIFIED WITH: Chales Abrahams PharmD 11:10 07/17/17 ([email or web address]) Performed at [facility], [place] [address] [place], [place] [address]    Culture GRAM POSITIVE COCCI  Final   Report Status  PENDING  Incomplete  Blood Culture (routine x 2)     Status: None (Preliminary result)   Collection Time: 07/20/2017  1:26 PM  Result Value Ref Range Status   Specimen Description   Final    LEFT ANTECUBITAL Performed  at [facility], [place] [address] [place], [place] [address]    Special Requests   Final    BOTTLES DRAWN AEROBIC AND ANAEROBIC Blood Culture results may not be optimal due to an excessive volume of blood received in culture bottles Performed at [place] [address] [place], [place] [address]    Culture   Final    NO GROWTH < 24 HOURS Performed at [place] [address], [place], [place] [address]    Report Status PENDING  Incomplete  Blood Culture ID Panel (Reflexed)     Status: Abnormal   Collection Time: 07/29/2017  1:26 PM  Result Value Ref Range Status   Enterococcus species NOT DETECTED NOT DETECTED Final   Listeria monocytogenes NOT DETECTED NOT DETECTED Final   Staphylococcus species DETECTED (A) NOT DETECTED Final    Comment: CRITICAL RESULT CALLED TO, READ BACK BY AND VERIFIED WITH: Chales Abrahams PharmD 11:10 07/17/17 ([email or web address])    Staphylococcus aureus DETECTED (A) NOT DETECTED Final    Comment: Methicillin (oxacillin)-resistant Staphylococcus aureus (MRSA). MRSA is predictably resistant to beta-lactam antibiotics (except ceftaroline). Preferred therapy is vancomycin unless clinically contraindicated. Patient requires contact precautions if  hospitalized. CRITICAL RESULT CALLED TO, READ BACK BY AND VERIFIED WITH: Chales Abrahams PharmD 11:10 07/17/17 ([email or web address])    Methicillin resistance DETECTED (A) NOT DETECTED Final    Comment: CRITICAL RESULT CALLED TO, READ BACK BY AND VERIFIED WITH: Chales Abrahams PharmD 11:10 07/17/17 ([email or web address])    Streptococcus species NOT DETECTED NOT DETECTED Final   Streptococcus agalactiae NOT DETECTED NOT DETECTED Final   Streptococcus pneumoniae NOT DETECTED NOT DETECTED Final   Streptococcus  pyogenes NOT DETECTED NOT DETECTED Final   Acinetobacter baumannii NOT DETECTED NOT DETECTED Final   Enterobacteriaceae species NOT DETECTED NOT DETECTED Final   Enterobacter cloacae complex NOT DETECTED NOT DETECTED Final   Escherichia coli NOT DETECTED NOT DETECTED Final   Klebsiella oxytoca NOT DETECTED NOT DETECTED Final   Klebsiella pneumoniae NOT DETECTED NOT DETECTED Final   Proteus species NOT DETECTED NOT DETECTED Final   Serratia marcescens NOT DETECTED NOT DETECTED Final   Haemophilus influenzae NOT DETECTED NOT DETECTED Final   Neisseria meningitidis NOT DETECTED NOT DETECTED Final   Pseudomonas aeruginosa NOT DETECTED NOT DETECTED Final   Candida albicans NOT DETECTED NOT DETECTED Final   Candida glabrata NOT DETECTED NOT DETECTED Final   Candida krusei NOT DETECTED NOT DETECTED Final   Candida parapsilosis NOT DETECTED NOT DETECTED Final   Candida tropicalis NOT DETECTED NOT DETECTED Final    Comment: Performed at [facility], [place]. [address], [place], [place] [address]  Culture, blood (routine x 2) Call MD if unable to obtain prior to antibiotics being given     Status: None (Preliminary result)   Collection Time: 07/18/2017  8:35 PM  Result Value Ref Range Status   Specimen Description   Final    BLOOD LEFT ANTECUBITAL Performed at [place] [address] [place], [place] [address]    Special Requests   Final    BOTTLES DRAWN AEROBIC AND ANAEROBIC Blood Culture adequate volume Performed at [place] [address], [place], [place] [address]    Culture   Final    NO GROWTH < 24 HOURS Performed at [place] [address], [place], [place] [address]    Report Status PENDING  Incomplete  Culture, blood (routine x 2) Call MD if unable  to obtain prior to antibiotics being given     Status: None (Preliminary result)   Collection Time: 07/25/2017  9:00 PM  Result Value Ref Range Status   Specimen Description    Final    BLOOD LEFT ARM Performed at [place] [address], [place], [place] [address]    Special Requests   Final    BOTTLES DRAWN AEROBIC AND ANAEROBIC Blood Culture adequate volume Performed at [place] [address] [place], [place] [address]    Culture   Final    NO GROWTH < 24 HOURS Performed at [place] [address], [place], [place] [address]    Report Status PENDING  Incomplete  Respiratory Panel by PCR     Status: None   Collection Time: 08/06/2017  9:42 PM  Result Value Ref Range Status   Adenovirus NOT DETECTED NOT DETECTED Final   Coronavirus 229E NOT DETECTED NOT DETECTED Final   Coronavirus HKU1 NOT DETECTED NOT DETECTED Final   Coronavirus NL63 NOT DETECTED NOT DETECTED Final   Coronavirus OC43 NOT DETECTED NOT DETECTED Final   Metapneumovirus NOT DETECTED NOT DETECTED Final   Rhinovirus / Enterovirus NOT DETECTED NOT DETECTED Final   Influenza A NOT DETECTED NOT DETECTED Final   Influenza B NOT DETECTED NOT DETECTED Final   Parainfluenza Virus 1 NOT DETECTED NOT DETECTED Final   Parainfluenza Virus 2 NOT DETECTED NOT DETECTED Final   Parainfluenza Virus 3 NOT DETECTED NOT DETECTED Final   Parainfluenza Virus 4 NOT DETECTED NOT DETECTED Final   Respiratory Syncytial Virus NOT DETECTED NOT DETECTED Final   Bordetella pertussis NOT DETECTED NOT DETECTED Final   Chlamydophila pneumoniae NOT DETECTED NOT DETECTED Final   Mycoplasma pneumoniae NOT DETECTED NOT DETECTED Final    Comment: Performed at [facility], [place] [address] [place], [place] [address]  MRSA PCR Screening     Status: None   Collection Time: 07/31/2017  9:43 PM  Result Value Ref Range Status   MRSA by PCR NEGATIVE NEGATIVE Final    Comment:        The GeneXpert MRSA Assay (FDA approved for NASAL specimens only), is one component of a comprehensive MRSA colonization surveillance program. It is not intended to diagnose  MRSA infection nor to guide or monitor treatment for MRSA infections. Performed at [facility], [place] [address] [place], [place] [address]     Alcide Evener, [place] [facility] [phone] pager  07/17/2017, 5:24 PM

## 2017-07-17 NOTE — Progress Notes (Signed)
  Echocardiogram 2D Echocardiogram has been performed.  Merrie Roof F 07/17/2017, 1:14 PM

## 2017-07-17 NOTE — Progress Notes (Signed)
Pharmacy Antibiotic Note  Theresa Morrow is a 62 y.o. female admitted on 07/23/2017 with c/o ongoing, severe back pain, increased confusion. Hx of PNA several wks ago.  Pharmacy has been consulted for Vancomycin & Cefepime dosing.   CXray clear, no acute process. CT: non-obstructing renal stones, mild pulm lingular infiltrate.  Today, 07/17/2017: SCr improved to 1.72, CrCl ~ 37 ml/min WBC 25.2 Afebrile New positive MRSA blood culture.  Plan: D/C Cefepime per ID Adjust to Vancomycin 1000 mg IV q36h. Check vancomycin levels if remains on vancomycin > 3-4 days.  Goal AUC 400-500. Follow up renal fxn, culture results, and clinical course. F/u ability to de-escalate antibiotics.   Height: 5\' 7"  (170.2 cm) Weight: 182 lb (82.6 kg) IBW/kg (Calculated) : 61.6  Temp (24hrs), Avg:98.1 F (36.7 C), Min:97.7 F (36.5 C), Max:98.8 F (37.1 C)  Recent Labs  Lab 07/14/17 1940 08/06/2017 1238 07/25/2017 1330 08/04/2017 1605 08/08/2017 2032 07/13/2017 2248 07/17/17 0443  WBC 2.2* 15.3*  --   --   --  23.7* 25.2*  CREATININE 1.40* 2.56*  --   --  2.02* 1.83* 1.72*  LATICACIDVEN  --   --  2.60* 2.06*  --  1.5  --     Estimated Creatinine Clearance: 37.5 mL/min (A) (by C-G formula based on SCr of 1.72 mg/dL (H)).    No Known Allergies  Antimicrobials this admission:  5/6 Vanc >>  5/6 Cefepime >> 5/7  Dose adjustments this admission:  5/7 Vancomycin dose adjusted for improving renal function.  Microbiology results:  5/6 BCx: GPC        5/7 BCID:  Staph aureus, methicillin resistant 5/6 UCx: sent 5/6 Strep pneumo: positive 5/6 Legionella: ordered 5/6 Flu panel PCR: negative 5/6 Respiratory panel PCR: collected 5/6 MRSA PCR: negative  Thank you for allowing pharmacy to be a part of this patient's care.  Gretta Arab PharmD, BCPS Pager [phone] 07/17/2017 8:58 AM

## 2017-07-17 NOTE — Progress Notes (Signed)
Pharmacy: Re- heparin  Patient's a 62 y.o F presented to the ED on 07/28/2017 with hypotension, AMS and hallucination.  She was found to be in afib with RVR and now on heparin drip.  - heparin level is undetectable (goal 0.3-0.7).   - Per RN, no issues with IV line and no bleeding noted -   Plan: - heparin 2500 units IV x1 bolus, then increase drip to 1350 units/hr - check  6 hr heparin level - monitor for s/s bleeding  Dia Sitter, PharmD, BCPS 07/17/2017 8:28 PM

## 2017-07-17 NOTE — Progress Notes (Signed)
Patient ID: Theresa Morrow, female   DOB: March 13, 1956, 62 y.o.   MRN: [id number]                                                                PROGRESS NOTE                                                                                                                                                                                                             Patient Demographics:    Theresa Morrow, is a 62 y.o. female, DOB - 07/02/1955, KDT:[id number]  Admit date - 08/07/2017   Admitting Physician Jani Gravel, MD  Outpatient Primary MD for the patient is [place], [place] [facility]  Outpatient Specialists:     Chief Complaint  Patient presents with  . Hypotension  . Altered Mental Status  . Hallucinations       Brief Narrative  62 y.o. female, w brain tumor in 2007, chronic headache, gerd,  c/o lower back without radiation.  Denies numbness, tingling, weakness, incontinence.  Started Tuesday ( 4/30.2019) while at work, no hx of trauma.  Pt couldn't walk more than 100 feet. Presented to ED on 5/1 and had xray.  5/4 had CT scan L spine.  Today, pt was told to go to ED due to low bp and altered mental status. Glucose was ?66    In Ed,  CT scan abd/ pelvis IMPRESSION: Nonobstructing left renal stones. Postoperative changes as described. Mild lingular infiltrate.  CT brain  IMPRESSION: 1. No acute intracranial abnormality. 2. 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  Na 138, 4.0 Bun 63, Creatinine 2.56 Ast 72, Alt 39, Alk phos 161 T. Bili 1.2  Wbc 15.3, hgb 13.8, Plt 122 Esr 32  Blood culture x2 pending Urinalysis  Wbc 6-10, rbc 21-50,  Urine culture pending  Lactic acid 2.60 Original ekg showed nsr,  At 5 pm, pt tachycardic and asymptomatic EKG showed Afib with RVR at 180   Pt will be admitted for sepsis secondary to ? Lingular infiltrate vs uti and Afib with RVR.      Subjective:    Tashae Inda today  has been doing better. HR improved from 180 to low 100s on cardizem.  Amiodarone was not started  as PCCM appears to have cancelled out the orders.   No headache, No chest pain, No abdominal pain - No Nausea, No new weakness tingling or numbness, No Cough - SOB.    Assessment  & Plan :    Principal Problem:   ARF (acute renal failure) (HCC) Active Problems:   Atrial fibrillation with RVR (HCC)   Sepsis (HCC)   UTI (urinary tract infection)   CAP (community acquired pneumonia)   AKI (acute kidney injury) ([place])   Lactic acidosis   Lumbar back pain     Sepsis (tachy, hypotension, elevation in LA) ddx HCAP vs UTI Blood culture x2 Urine culture Sputum gram stain culture Urine legionella Urine strep antigen Vanco iv, pharmacy to dose,  Cefepime iv pharmacy to dose  Hypotension improved with Fluid Tele Trop I q6h x3 Cortisol   Afib with RVR Tele Trop I q6hx3 Tsh Cardiac echo Rate control with cardizem 57m iv x1 then cardizem Gtt Cardiology consult for assistance with rate control Pt started on lovenox by X cover  ARF Check urine sodium, urine creatinine, urine eosinophils Hydrate with d5 ns iv Check cmp in am  Hypoglycemia Check hga1c fsbs q4h  Back pain MRI L spine  Gerd Cont PPI        Code Status : FULL CODE  Family Communication  : w patient  Disposition Plan  : home  Barriers For Discharge :   Consults  :  cardiology  Procedures  : echo pending  DVT Prophylaxis  :  Lovenox -  - SCDs   Lab Results  Component Value Date   PLT 122 (L) 07/17/2017    Antibiotics  :  Vanco, cefepime 5/6=>  Anti-infectives (From admission, onward)   Start     Dose/Rate Route Frequency Ordered Stop   07/18/17 2000  vancomycin (VANCOCIN) IVPB 1000 mg/200 mL premix     1,000 mg 200 mL/hr over 60 Minutes Intravenous Every 48 hours 07/17/2017 1931     07/17/17 1400  ceFEPIme (MAXIPIME) 1 g in sodium chloride 0.9 % 100 mL IVPB     1 g 200 mL/hr  over 30 Minutes Intravenous Every 24 hours 07/28/2017 1931     07/13/2017 2000  vancomycin (VANCOCIN) 500 mg in sodium chloride 0.9 % 100 mL IVPB     500 mg 100 mL/hr over 60 Minutes Intravenous  Once 07/30/2017 1931 07/25/2017 2220   08/07/2017 1900  cefTRIAXone (ROCEPHIN) 1 g in sodium chloride 0.9 % 100 mL IVPB  Status:  Discontinued     1 g 200 mL/hr over 30 Minutes Intravenous Every 24 hours 07/12/2017 1849 08/05/2017 1901   08/07/2017 1900  azithromycin (ZITHROMAX) 500 mg in sodium chloride 0.9 % 250 mL IVPB  Status:  Discontinued     500 mg 250 mL/hr over 60 Minutes Intravenous Every 24 hours 07/13/2017 1849 08/06/2017 1901   07/27/2017 1300  piperacillin-tazobactam (ZOSYN) IVPB 3.375 g  Status:  Discontinued     3.375 g 100 mL/hr over 30 Minutes Intravenous  Once 07/13/2017 1245 08/10/2017 1257   08/01/2017 1300  vancomycin (VANCOCIN) IVPB 1000 mg/200 mL premix     1,000 mg 200 mL/hr over 60 Minutes Intravenous  Once 07/30/2017 1245 08/09/2017 1424   07/18/2017 1300  ceFEPIme (MAXIPIME) 2 g in sodium chloride 0.9 % 100 mL IVPB     2 g 200 mL/hr over 30 Minutes Intravenous  Once 07/29/2017 1300 07/25/2017 1346        Objective:   Vitals:  07/17/17 0200 07/17/17 0300 07/17/17 0309 07/17/17 0400  BP: (!) 80/46 (!) 79/43  (!) 86/51  Pulse: (!) 108 (!) 102  (!) 57  Resp: _0 Temp:   98.4 F (36.9 C)   TempSrc:   Axillary   SpO2: 95% 95%  93%  Weight:      Height:        Wt Readings from Last 3 Encounters:  08/08/2017 82.6 kg (182 lb)  05/18/16 86.4 kg (190 lb 6 oz)  12/23/15 83.5 kg (184 lb)     Intake/Output Summary (Last 24 hours) at 07/17/2017 0530 Last data filed at 07/17/2017 0000 Gross per 24 hour  Intake 4325.58 ml  Output -  Net 4325.58 ml     Physical Exam  Awake Alert, Oriented X 3, No new F.N deficits, Normal affect [place].AT,PERRAL Supple Neck,No JVD, No cervical lymphadenopathy appriciated.  Symmetrical Chest wall movement, Good air movement bilaterally, CTAB Irr, irr, s1, s2,    +ve B.Sounds, Abd Soft, No tenderness, No organomegaly appriciated, No rebound - guarding or rigidity. No Cyanosis, Clubbing or edema, No new Rash or bruise   No janeway, no osler     Data Review:    CBC Recent Labs  Lab 07/14/17 1940 08/01/2017 1238 07/26/2017 2248 07/17/17 0443  WBC 2.2* 15.3* 23.7* 25.2*  HGB 12.6 13.8 11.6* 11.0*  HCT 38.9 42.2 35.3* 33.7*  PLT 119* 122* 129* 122*  MCV 88.2 87.0 84.7 84.7  MCH 28.6 28.5 27.8 27.6  MCHC 32.4 32.7 32.9 32.6  RDW 15.8* 16.5* 16.4* 16.3*  LYMPHSABS 0.5*  --  1.4  --   MONOABS 0.5  --  0.5  --   EOSABS 0.0  --  0.0  --   BASOSABS 0.0  --  0.0  --     Chemistries  Recent Labs  Lab 07/14/17 1940 07/31/2017 1238 08/08/2017 2032 07/11/2017 2248  NA 141 138  --  138  K 3.7 4.0  --  3.8  CL 109 104  --  111  CO2 21* 18*  --  17*  GLUCOSE 94 72  --  148*  BUN 40* 63*  --  56*  CREATININE 1.40* 2.56* 2.02* 1.83*  CALCIUM 8.5* 8.5*  --  7.7*  MG  --   --   --  1.9  AST  --  72*  --  49*  ALT  --  39  --  32  ALKPHOS  --  161*  --  129*  BILITOT  --  1.2  --  1.1   ------------------------------------------------------------------------------------------------------------------ No results for input(s): CHOL, HDL, LDLCALC, TRIG, CHOLHDL, LDLDIRECT in the last 72 hours.  No results found for: HGBA1C ------------------------------------------------------------------------------------------------------------------ Recent Labs    07/30/2017 1733  TSH 0.465   ------------------------------------------------------------------------------------------------------------------ No results for input(s): VITAMINB12, FOLATE, FERRITIN, TIBC, IRON, RETICCTPCT in the last 72 hours.  Coagulation profile Recent Labs  Lab 07/22/2017 2248  INR 1.13    Recent Labs    07/26/2017 1748  DDIMER 7.82*    Cardiac Enzymes Recent Labs  Lab 07/13/2017 1733 07/14/2017 2032 07/24/2017 2248  CKMB  --  10.4*  --   TROPONINI <0.03  --  0.03*    ------------------------------------------------------------------------------------------------------------------ No results found for: BNP  Inpatient Medications  Scheduled Meds: . aspirin  325 mg Oral Daily  . heparin  5,000 Units Subcutaneous Q8H  . magic mouthwash  1 mL Oral TID  . methocarbamol  500 mg Oral BID  .  pantoprazole  40 mg Oral Daily   Continuous Infusions: . sodium chloride 100 mL/hr at 08/10/2017 2124  . ceFEPime (MAXIPIME) IV    . dextrose 5 % and 0.9% NaCl Stopped (07/15/2017 2126)  . diltiazem (CARDIZEM) infusion 15 mg/hr (07/17/17 0018)  . [START ON 07/18/2017] vancomycin     PRN Meds:.acetaminophen, morphine injection, morphine injection  Micro Results Recent Results (from the past 240 hour(s))  MRSA PCR Screening     Status: None   Collection Time: 07/15/2017  9:43 PM  Result Value Ref Range Status   MRSA by PCR NEGATIVE NEGATIVE Final    Comment:        The GeneXpert MRSA Assay (FDA approved for NASAL specimens only), is one component of a comprehensive MRSA colonization surveillance program. It is not intended to diagnose MRSA infection nor to guide or monitor treatment for MRSA infections. Performed at [facility], [place] [address] [place], [place] [address]     Radiology Reports Ct Abdomen Pelvis Wo Contrast  Result Date: 07/11/2017 CLINICAL DATA:  Hypotension and abdominal pain EXAM: CT ABDOMEN AND PELVIS WITHOUT CONTRAST TECHNIQUE: Multidetector CT imaging of the abdomen and pelvis was performed following the standard protocol without IV contrast. COMPARISON:  None. FINDINGS: Lower chest: Lung bases are well aerated with the exception of mild lingular infiltrate adjacent to the heart. Hepatobiliary: No focal liver abnormality is seen. Status post cholecystectomy. No biliary dilatation. Pancreas: Unremarkable. No pancreatic ductal dilatation or surrounding inflammatory changes. Spleen: Normal in size without focal abnormality.  Adrenals/Urinary Tract: The adrenals are within normal limits. Small nonobstructing stones are noted within the left kidney. The collecting systems are otherwise within normal limits. The bladder is partially decompressed. Stomach/Bowel: Postsurgical changes are noted in the stomach. A sliding-type hiatal hernia is noted. 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 The appendix is within normal limits. Vascular/Lymphatic: No significant vascular findings are present. No enlarged abdominal or pelvic lymph nodes. Reproductive: Uterus and bilateral adnexa are unremarkable. Other: No abdominal wall hernia or abnormality. No abdominopelvic ascites. Musculoskeletal: Degenerative changes of lumbar spine are noted. IMPRESSION: Nonobstructing left renal stones. Postoperative changes as described. Mild lingular infiltrate. Electronically Signed   By: Inez Catalina M.D.   On: 08/02/2017 15:49   Dg Lumbar Spine Complete  Result Date: 07/11/2017 CLINICAL DATA:  Low back pain into hips for 2 days, fell 1 week ago EXAM: LUMBAR SPINE - COMPLETE 4+ VIEW COMPARISON:  CT abdomen and pelvis 11/17/2010 FINDINGS: Osseous demineralization. Five non-rib-bearing lumbar vertebra. Minimal biconvex thoracolumbar scoliosis. Multilevel degenerative disc disease changes with disc space narrowing and endplate spur formation. Facet degenerative changes lower lumbar spine. Vertebral body heights maintained without fracture or bone destruction. Minimal anterolisthesis at L4-L5 and retrolisthesis at L2-L3 noted. No definite spondylolysis. SI joints preserved. IMPRESSION: Osseous demineralization with degenerative disc and facet disease changes of the lumbar spine with associated biconvex thoracolumbar scoliosis and minimal listhesis. No acute abnormalities. Electronically Signed   By: Lavonia Dana M.D.   On: 07/11/2017 15:05   Ct Head Wo Contrast  Result Date: 07/31/2017 CLINICAL DATA:  Altered level of consciousness over the last 2  days, fatigue, hypotension EXAM: CT HEAD WITHOUT CONTRAST TECHNIQUE: Contiguous axial images were obtained from the base of the skull through the vertex without intravenous contrast. COMPARISON:  MR brain scan of 05/12/2014 FINDINGS: Brain: The ventricular system is normal in size and configuration and the septum is in a normal midline position. The fourth ventricle and basilar  cisterns are unremarkable. No hemorrhage, mass lesion, or acute infarction is seen. 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 Vascular: No vascular abnormality is seen on this unenhanced study. Skull: On bone window images, changes of prior left mastoidectomy are noted. No acute calvarial abnormality is seen. Sinuses/Orbits: The paranasal sinuses appear well pneumatized. Other: None. IMPRESSION: 1. No acute intracranial abnormality. 2. 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 Electronically Signed   By: Ivar Drape M.D.   On: 07/15/2017 15:45   Ct Lumbar Spine Wo Contrast  Result Date: 07/14/2017 CLINICAL DATA:  Initial evaluation for low back pain with bilateral leg pain. EXAM: CT LUMBAR SPINE WITHOUT CONTRAST TECHNIQUE: Multidetector CT imaging of the lumbar spine was performed without intravenous contrast administration. Multiplanar CT image reconstructions were also generated. COMPARISON:  Prior radiograph 07/11/2017. FINDINGS: Segmentation: Normal segmentation. Lowest well-formed disc labeled the L5-S1 level. Alignment: 3 mm retrolisthesis of L2 on L3, with up to 4 mm retrolisthesis of L4 on L5. Findings are likely chronic and facet mediated. Mild levoscoliosis, apex at L3. Vertebrae: Vertebral body heights maintained without evidence for acute or chronic fracture. Chronic reactive endplate changes present about the right aspect of the L3-4 interspace due to scoliotic curvature. No discrete lytic or blastic osseous lesions. Visualized sacrum and pelvis unremarkable.  Paraspinal and other soft tissues: No acute paraspinous soft tissue abnormality. Patient status post gastric bypass. Punctate nonobstructive left renal nephrolithiasis. Disc levels: L1-2: Mild diffuse disc bulge, asymmetric to the right. Mild bilateral facet hypertrophy. No significant canal stenosis. Mild right L1 foraminal narrowing. L2-3: Retrolisthesis. Chronic intervertebral disc space narrowing with disc bulge. Mild facet hypertrophy. Resultant mild canal with moderate bilateral L2 foraminal stenosis. L3-4: Chronic intervertebral disc space narrowing with diffuse disc bulge. Associated disc desiccation noted. Moderate facet hypertrophy. Resultant moderate canal with bilateral lateral recess narrowing. Severe right with moderate left L3 foraminal stenosis. L4-5: Anterolisthesis. Diffuse disc bulge, slightly asymmetric to the right. Severe facet arthropathy. Resultant severe canal with bilateral subarticular stenosis. Moderate bilateral L4 foraminal narrowing, slightly worse on the left. L5-S1: Diffuse disc bulge with intervertebral disc space narrowing. Severe left with moderate right facet arthrosis. Resultant mild left lateral recess narrowing without significant canal stenosis. Moderate left L5 foraminal narrowing. No significant right foraminal encroachment. IMPRESSION: 1. No acute abnormality within the lumbar spine. 2. Levoscoliosis with multilevel degenerative spondylolysis with resultant multilevel canal narrowing as above, moderate at L3-4 and severe at L4-5. 3. Multifactorial degenerative changes with resultant multilevel foraminal narrowing as above. 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 4. Punctate nonobstructive left renal nephrolithiasis. Electronically Signed   By: Jeannine Boga M.D.   On: 07/14/2017 20:24   Dg Chest Port  1 View  Result Date: 08/03/2017 CLINICAL DATA:  Cough. EXAM: PORTABLE CHEST 1 VIEW COMPARISON:  Chest x-ray dated June 13, 2017. FINDINGS: The heart size and mediastinal contours are within normal limits. Normal pulmonary vascularity. Resolved consolidation in the lingula with residual scarring/atelectasis. No focal consolidation, pleural effusion, or pneumothorax. No acute osseous abnormality. IMPRESSION: No active disease. Electronically Signed   By: Titus Dubin M.D.   On: 07/15/2017 13:11    Time Spent in minutes  30   Jani Gravel M.D on 07/17/2017 at 5:30 AM  Between 7am to 7pm - Pager - [phone]  After 7pm go to [email or web address] -  password [facility]  [facility] -  Office  [phone]

## 2017-07-18 ENCOUNTER — Inpatient Hospital Stay (HOSPITAL_COMMUNITY): Payer: BLUE CROSS/BLUE SHIELD

## 2017-07-18 DIAGNOSIS — J189 Pneumonia, unspecified organism: Secondary | ICD-10-CM

## 2017-07-18 DIAGNOSIS — M549 Dorsalgia, unspecified: Secondary | ICD-10-CM

## 2017-07-18 LAB — COMPREHENSIVE METABOLIC PANEL
ALK PHOS: 153 U/L — AB (ref 38–126)
ALT: 26 U/L (ref 14–54)
ANION GAP: 10 (ref 5–15)
AST: 30 U/L (ref 15–41)
Albumin: 2.1 g/dL — ABNORMAL LOW (ref 3.5–5.0)
BUN: 53 mg/dL — ABNORMAL HIGH (ref 6–20)
CALCIUM: 7.8 mg/dL — AB (ref 8.9–10.3)
CO2: 17 mmol/L — AB (ref 22–32)
Chloride: 111 mmol/L (ref 101–111)
Creatinine, Ser: 1.36 mg/dL — ABNORMAL HIGH (ref 0.44–1.00)
GFR calc Af Amer: 47 mL/min — ABNORMAL LOW (ref >60)
GFR calc non Af Amer: 41 mL/min — ABNORMAL LOW (ref >60)
Glucose, Bld: 129 mg/dL — ABNORMAL HIGH (ref 65–99)
Potassium: 4.3 mmol/L (ref 3.5–5.1)
SODIUM: 138 mmol/L (ref 135–145)
Total Bilirubin: 0.8 mg/dL (ref 0.3–1.2)
Total Protein: 5.4 g/dL — ABNORMAL LOW (ref 6.5–8.1)

## 2017-07-18 LAB — CBC
HCT: 32.6 % — ABNORMAL LOW (ref 36.0–46.0)
Hemoglobin: 10.8 g/dL — ABNORMAL LOW (ref 12.0–15.0)
MCH: 28.1 pg (ref 26.0–34.0)
MCHC: 33.1 g/dL (ref 30.0–36.0)
MCV: 84.7 fL (ref 78.0–100.0)
PLATELETS: 144 10*3/uL — AB (ref 150–400)
RBC: 3.85 MIL/uL — AB (ref 3.87–5.11)
RDW: 16.7 % — ABNORMAL HIGH (ref 11.5–15.5)
WBC: 28.4 10*3/uL — ABNORMAL HIGH (ref 4.0–10.5)

## 2017-07-18 LAB — URINE CULTURE
CULTURE: NO GROWTH
Special Requests: NORMAL

## 2017-07-18 LAB — HEPARIN LEVEL (UNFRACTIONATED)
HEPARIN UNFRACTIONATED: 0.15 [IU]/mL — AB (ref 0.30–0.70)
Heparin Unfractionated: 0.12 IU/mL — ABNORMAL LOW (ref 0.30–0.70)
Heparin Unfractionated: 0.29 IU/mL — ABNORMAL LOW (ref 0.30–0.70)

## 2017-07-18 LAB — CALCIUM / CREATININE RATIO, URINE
Calcium, Ur: <0.8 mg/dL
Calcium/Creat.Ratio: <19 mg/g creat (ref 0–260)
Creatinine, Urine: 43.1 mg/dL

## 2017-07-18 LAB — GLUCOSE, CAPILLARY
GLUCOSE-CAPILLARY: 112 mg/dL — AB (ref 65–99)
Glucose-Capillary: 118 mg/dL — ABNORMAL HIGH (ref 65–99)

## 2017-07-18 LAB — LEGIONELLA PNEUMOPHILA SEROGP 1 UR AG: L. PNEUMOPHILA SEROGP 1 UR AG: NEGATIVE

## 2017-07-18 MED ORDER — HEPARIN (PORCINE) IN NACL 100-0.45 UNIT/ML-% IJ SOLN
1950.0000 [IU]/h | INTRAMUSCULAR | Status: DC
  Administered 2017-07-18 – 2017-07-20 (×4): 1950 [IU]/h via INTRAVENOUS
  Filled 2017-07-18 (×3): qty 250

## 2017-07-18 MED ORDER — ORAL CARE MOUTH RINSE
15.0000 mL | Freq: Two times a day (BID) | OROMUCOSAL | Status: DC
  Administered 2017-07-18 – 2017-07-20 (×6): 15 mL via OROMUCOSAL

## 2017-07-18 MED ORDER — HEPARIN (PORCINE) IN NACL 100-0.45 UNIT/ML-% IJ SOLN
1800.0000 [IU]/h | INTRAMUSCULAR | Status: DC
  Administered 2017-07-18: 1800 [IU]/h via INTRAVENOUS
  Filled 2017-07-18: qty 250

## 2017-07-18 MED ORDER — HEPARIN BOLUS VIA INFUSION
4000.0000 [IU] | Freq: Once | INTRAVENOUS | Status: AC
  Administered 2017-07-18: 4000 [IU] via INTRAVENOUS
  Filled 2017-07-18: qty 4000

## 2017-07-18 MED ORDER — SODIUM CHLORIDE 0.9 % IV SOLN
1250.0000 mg | INTRAVENOUS | Status: DC
  Administered 2017-07-18: 1250 mg via INTRAVENOUS
  Filled 2017-07-18: qty 1250

## 2017-07-18 NOTE — Progress Notes (Signed)
ANTICOAGULATION CONSULT NOTE - Follow Up Consult  Pharmacy Consult for Heparin Indication: atrial fibrillation  No Known Allergies  Patient Measurements: Height: 5\' 7"  (170.2 cm) Weight: 182 lb (82.6 kg) IBW/kg (Calculated) : 61.6 Heparin Dosing Weight:   Vital Signs: Temp: 97.2 F (36.2 C) (05/08 0309) Temp Source: Oral (05/08 0309) BP: 105/65 (05/08 0400) Pulse Rate: 79 (05/08 0400)  Labs: Recent Labs    07/15/2017 1238 08/09/2017 1733 08/05/2017 2032 08/01/2017 2248 07/17/17 0443 07/17/17 1906 07/18/17 0311  HGB 13.8  --   --  11.6* 11.0*  --   --   HCT 42.2  --   --  35.3* 33.7*  --   --   PLT 122*  --   --  129* 122*  --   --   APTT  --   --   --  37*  --   --   --   LABPROT  --   --   --  14.4  --   --   --   INR  --   --   --  1.13  --   --   --   HEPARINUNFRC  --   --   --   --   --  <0.10* 0.15*  CREATININE 2.56*  --  2.02* 1.83* 1.72*  --  1.36*  CKTOTAL  --   --  79  --   --   --   --   CKMB  --   --  10.4*  --   --   --   --   TROPONINI  --  <0.03  --  0.03* 0.03*  --   --     Estimated Creatinine Clearance: 47.4 mL/min (A) (by C-G formula based on SCr of 1.36 mg/dL (H)).   Medications:  Infusions:  . diltiazem (CARDIZEM) infusion 10 mg/hr (07/18/17 0215)  . heparin 1,350 Units/hr (07/18/17 0000)  . vancomycin Stopped (07/17/17 1341)    Assessment: Heparin level low.  No heparin drip issues per RN.  Goal of Therapy:  Heparin level 0.3-0.7 units/ml Monitor platelets by anticoagulation protocol: Yes   Plan:  Increase heparin to 1500 units/hr Recheck heparin level at [address], [place] 07/18/2017,5:34 AM

## 2017-07-18 NOTE — Progress Notes (Signed)
Patient ID: CASADY VOSHELL, female   DOB: [birth year], 62 y.o.   MRN: [id number]                                                                PROGRESS NOTE                                                                                                                                                                                                             Patient Demographics:    Malayia Spizzirri, is a 62 y.o. female, DOB - [birth year], WIO:[id number]  Admit date - 08/05/2017   Admitting Physician Jani Gravel, MD  Outpatient Primary MD for the patient is DeRidder, Dolores 2  Outpatient Specialists:     Chief Complaint  Patient presents with  . Hypotension  . Altered Mental Status  . Hallucinations       Brief Narrative  62 y.o. female, w brain tumor in 2007, chronic headache, gerd,  c/o lower back without radiation.  Denies numbness, tingling, weakness, incontinence.  Started Tuesday ( 4/30.2019) while at work, no hx of trauma.  Pt couldn't walk more than 100 feet. Presented to ED on 5/1 and had xray.  5/4 had CT scan L spine.  Pt was told to go to ED due to low bp and altered mental status. Glucose was ?53    In Ed,  CT scan abd/ pelvis IMPRESSION: Nonobstructing left renal stones. Postoperative changes as described. Mild lingular infiltrate.  CT brain  IMPRESSION: 1. No acute intracranial abnormality. 2. 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  Na 138, 4.0 Bun 63, Creatinine 2.56 Ast 72, Alt 39, Alk phos 161 T. Bili 1.2  Wbc 15.3, hgb 13.8, Plt 122 Esr 32  Blood culture x2 pending Urinalysis  Wbc 6-10, rbc 21-50,  Urine culture pending  Lactic acid 2.60 Original ekg showed nsr,  At 5 pm, pt tachycardic and asymptomatic EKG showed Afib with RVR at 180   Pt will be admitted for sepsis secondary to ? Lingular infiltrate vs uti and Afib with RVR.      Subjective:    Bernie Fobes today has  been doing better. HR improved from 180 to low 100s on cardizem.  Amiodarone was not started as  PCCM appears to have cancelled out the orders.   No headache, No chest pain, No abdominal pain - No Nausea, No new weakness tingling or numbness, No Cough - SOB.    Assessment  & Plan :    Principal Problem:   ARF (acute renal failure) (HCC) Active Problems:   Atrial fibrillation with RVR (HCC)   Severe sepsis with septic shock (HCC)   UTI (urinary tract infection)   CAP (community acquired pneumonia)   AKI (acute kidney injury) ([facility])   Lactic acidosis   Lumbar back pain   MRSA bacteremia     Sepsis (tachy, hypotension, elevation in LA) ddx HCAP vs UTI - Pt with S aureus bacteremia. ID on board and assisting with work up. Antibiotics per ID. I have contacted cardiology for TEE to assess for source.   Hypotension improved with Fluids - troponin with flat trend  Afib with RVR - Cardiology on board currently and managing. Pt on Cardizem - anticoagulated on Heparin   ARF - improved with rehydration, as such suspecting prerenal etiology  Hypoglycemia - resolved as such will d/c blood glucose checks.  Back pain - MRI L spine  Gerd - Cont PPI   Code Status : FULL CODE  Family Communication  : w patient  Disposition Plan  : home  Barriers For Discharge : afib with rvr requiring IV medication  Consults  :  cardiology  Procedures  : TEE pending  DVT Prophylaxis  :  Pt on heparin  Lab Results  Component Value Date   PLT 144 (L) 07/18/2017    Antibiotics  :  Vancomycin  Anti-infectives (From admission, onward)   Start     Dose/Rate Route Frequency Ordered Stop   07/18/17 2000  vancomycin (VANCOCIN) IVPB 1000 mg/200 mL premix  Status:  Discontinued     1,000 mg 200 mL/hr over 60 Minutes Intravenous Every 48 hours 07/18/2017 1931 07/17/17 1154   07/18/17 1200  vancomycin (VANCOCIN) 1,250 mg in sodium chloride 0.9 % 250 mL IVPB     1,250 mg 166.7 mL/hr over  90 Minutes Intravenous Every 36 hours 07/18/17 0728     07/17/17 1400  ceFEPIme (MAXIPIME) 1 g in sodium chloride 0.9 % 100 mL IVPB  Status:  Discontinued     1 g 200 mL/hr over 30 Minutes Intravenous Every 24 hours 08/08/2017 1931 07/17/17 0901   07/17/17 1200  vancomycin (VANCOCIN) IVPB 1000 mg/200 mL premix  Status:  Discontinued     1,000 mg 200 mL/hr over 60 Minutes Intravenous Every 36 hours 07/17/17 1154 07/18/17 0728   07/17/17 1000  ceFEPIme (MAXIPIME) 1 g in sodium chloride 0.9 % 100 mL IVPB  Status:  Discontinued     1 g 200 mL/hr over 30 Minutes Intravenous Every 12 hours 07/17/17 0901 07/17/17 1149   07/24/2017 2000  vancomycin (VANCOCIN) 500 mg in sodium chloride 0.9 % 100 mL IVPB     500 mg 100 mL/hr over 60 Minutes Intravenous  Once 07/27/2017 1931 07/14/2017 2220   07/28/2017 1900  cefTRIAXone (ROCEPHIN) 1 g in sodium chloride 0.9 % 100 mL IVPB  Status:  Discontinued     1 g 200 mL/hr over 30 Minutes Intravenous Every 24 hours 07/12/2017 1849 07/12/2017 1901   07/27/2017 1900  azithromycin (ZITHROMAX) 500 mg in sodium chloride 0.9 % 250 mL IVPB  Status:  Discontinued     500 mg 250 mL/hr over 60 Minutes Intravenous Every 24 hours 08/08/2017 1849 08/05/2017 1901   07/18/2017  1300  piperacillin-tazobactam (ZOSYN) IVPB 3.375 g  Status:  Discontinued     3.375 g 100 mL/hr over 30 Minutes Intravenous  Once 07/18/2017 1245 07/27/2017 1257   07/12/2017 1300  vancomycin (VANCOCIN) IVPB 1000 mg/200 mL premix     1,000 mg 200 mL/hr over 60 Minutes Intravenous  Once 08/10/2017 1245 07/30/2017 1424   08/01/2017 1300  ceFEPIme (MAXIPIME) 2 g in sodium chloride 0.9 % 100 mL IVPB     2 g 200 mL/hr over 30 Minutes Intravenous  Once 07/15/2017 1300 07/17/2017 1346        Objective:   Vitals:   07/18/17 0309 07/18/17 0400 07/18/17 0700 07/18/17 0810  BP:  105/65 119/89   Pulse:  79 (!) 120   Resp:  20 10   Temp: (!) 97.2 F (36.2 C)   97.9 F (36.6 C)  TempSrc: Oral   Oral  SpO2:  96% 97%   Weight:      Height:         Wt Readings from Last 3 Encounters:  08/02/2017 82.6 kg (182 lb)  05/18/16 86.4 kg (190 lb 6 oz)  12/23/15 83.5 kg (184 lb)     Intake/Output Summary (Last 24 hours) at 07/18/2017 1311 Last data filed at 07/18/2017 0900 Gross per 24 hour  Intake 918.56 ml  Output 650 ml  Net 268.56 ml     Physical Exam  GEN: Awake Alert, Oriented X 3, No new F.N deficits, Normal affect [place].AT,PERRAL Supple Neck,No JVD, No cervical lymphadenopathy appriciated.  Pulm: equal chest rise, Good air movement bilaterally, CTAB Irr, irr, s1, s2,  Abdomen: + B.Sounds, Abd Soft, No tenderness, No organomegaly appriciated, No rebound - guarding or rigidity. SKIN: No Cyanosis, Clubbing or edema, No new Rash or bruise   No janeway, no osler     Data Review:    CBC Recent Labs  Lab 07/14/17 1940 07/27/2017 1238 07/12/2017 2248 07/17/17 0443 07/18/17 0311  WBC 2.2* 15.3* 23.7* 25.2* 28.4*  HGB 12.6 13.8 11.6* 11.0* 10.8*  HCT 38.9 42.2 35.3* 33.7* 32.6*  PLT 119* 122* 129* 122* 144*  MCV 88.2 87.0 84.7 84.7 84.7  MCH 28.6 28.5 27.8 27.6 28.1  MCHC 32.4 32.7 32.9 32.6 33.1  RDW 15.8* 16.5* 16.4* 16.3* 16.7*  LYMPHSABS 0.5*  --  1.4  --   --   MONOABS 0.5  --  0.5  --   --   EOSABS 0.0  --  0.0  --   --   BASOSABS 0.0  --  0.0  --   --     Chemistries  Recent Labs  Lab 07/14/17 1940 07/13/2017 1238 07/31/2017 2032 07/11/2017 2248 07/17/17 0443 07/18/17 0311  NA 141 138  --  138  --  138  K 3.7 4.0  --  3.8  --  4.3  CL 109 104  --  111  --  111  CO2 21* 18*  --  17*  --  17*  GLUCOSE 94 72  --  148*  --  129*  BUN 40* 63*  --  56*  --  53*  CREATININE 1.40* 2.56* 2.02* 1.83* 1.72* 1.36*  CALCIUM 8.5* 8.5*  --  7.7*  --  7.8*  MG  --   --   --  1.9  --   --   AST  --  72*  --  49*  --  30  ALT  --  39  --  32  --  26  ALKPHOS  --  161*  --  129*  --  153*  BILITOT  --  1.2  --  1.1  --  0.8    ------------------------------------------------------------------------------------------------------------------ No results for input(s): CHOL, HDL, LDLCALC, TRIG, CHOLHDL, LDLDIRECT in the last 72 hours.  No results found for: HGBA1C ------------------------------------------------------------------------------------------------------------------ Recent Labs    07/15/2017 1733  TSH 0.465   ------------------------------------------------------------------------------------------------------------------ No results for input(s): VITAMINB12, FOLATE, FERRITIN, TIBC, IRON, RETICCTPCT in the last 72 hours.  Coagulation profile Recent Labs  Lab 07/20/2017 2248  INR 1.13    Recent Labs    07/13/2017 1748  DDIMER 7.82*    Cardiac Enzymes Recent Labs  Lab 07/29/2017 1733 08/05/2017 2032 08/08/2017 2248 07/17/17 0443  CKMB  --  10.4*  --   --   TROPONINI <0.03  --  0.03* 0.03*   ------------------------------------------------------------------------------------------------------------------ No results found for: BNP  Inpatient Medications  Scheduled Meds: . aspirin  325 mg Oral Daily  . methocarbamol  500 mg Oral BID  . pantoprazole  40 mg Oral Daily   Continuous Infusions: . diltiazem (CARDIZEM) infusion 10 mg/hr (07/18/17 1057)  . heparin 1,500 Units/hr (07/18/17 0615)  . vancomycin     PRN Meds:.acetaminophen, morphine injection, morphine injection  Micro Results Recent Results (from the past 240 hour(s))  Blood Culture (routine x 2)     Status: Abnormal (Preliminary result)   Collection Time: 08/10/2017  1:26 PM  Result Value Ref Range Status   Specimen Description   Final    RIGHT ANTECUBITAL Performed at [facility] [address] [place], [place] [address]    Special Requests   Final    BOTTLES DRAWN AEROBIC AND ANAEROBIC Blood Culture adequate volume Performed at [facility] [address], [place], [place] [address]     Culture  Setup Time   Final    GRAM POSITIVE COCCI IN BOTH AEROBIC AND ANAEROBIC BOTTLES CRITICAL RESULT CALLED TO, READ BACK BY AND VERIFIED WITH: Chales Abrahams PharmD 11:10 07/17/17 ([email or web address])    Culture (A)  Final    STAPHYLOCOCCUS AUREUS SUSCEPTIBILITIES TO FOLLOW Performed at [facility], [place] [address], [place], [place] [address]    Report Status PENDING  Incomplete  Blood Culture (routine x 2)     Status: None (Preliminary result)   Collection Time: 07/14/2017  1:26 PM  Result Value Ref [place] Status   Specimen Description   Final    LEFT ANTECUBITAL Performed at [facility] [address] [place], [place] [address]    Special Requests   Final    BOTTLES DRAWN AEROBIC AND ANAEROBIC Blood Culture results may not be optimal due to an excessive volume of blood received in culture bottles Performed at [facility] [address], [place], [place] [address]    Culture   Final    NO GROWTH 2 DAYS Performed at [facility] [address] [place], [place] [address]    Report Status PENDING  Incomplete  Urine culture     Status: None   Collection Time: 08/01/2017  1:26 PM  Result Value Ref Range Status   Specimen Description   Final    URINE, CLEAN CATCH Performed at [facility], [place] [address] [place], [place] [address]    Special Requests   Final    Normal Performed at [facility], [place] [address], [place],  [address]    Culture  Final    NO GROWTH Performed at [facility], [place] [address], [place], [place] [address]    Report Status 07/18/2017 FINAL  Final  Blood Culture ID Panel (Reflexed)     Status: Abnormal   Collection Time: 07/24/2017  1:26 PM  Result Value Ref [place] Status   Enterococcus species NOT DETECTED NOT DETECTED Final   Listeria monocytogenes NOT DETECTED NOT DETECTED Final   Staphylococcus species DETECTED (A) NOT DETECTED Final    Comment: CRITICAL  RESULT CALLED TO, READ BACK BY AND VERIFIED WITH: Chales Abrahams PharmD 11:10 07/17/17 ([email or web address])    Staphylococcus aureus DETECTED (A) NOT DETECTED Final    Comment: Methicillin (oxacillin)-resistant Staphylococcus aureus (MRSA). MRSA is predictably resistant to beta-lactam antibiotics (except ceftaroline). Preferred therapy is vancomycin unless clinically contraindicated. Patient requires contact precautions if  hospitalized. CRITICAL RESULT CALLED TO, READ BACK BY AND VERIFIED WITH: Chales Abrahams PharmD 11:10 07/17/17 ([email or web address])    Methicillin resistance DETECTED (A) NOT DETECTED Final    Comment: CRITICAL RESULT CALLED TO, READ BACK BY AND VERIFIED WITH: Chales Abrahams PharmD 11:10 07/17/17 ([email or web address])    Streptococcus species NOT DETECTED NOT DETECTED Final   Streptococcus agalactiae NOT DETECTED NOT DETECTED Final   Streptococcus pneumoniae NOT DETECTED NOT DETECTED Final   Streptococcus pyogenes NOT DETECTED NOT DETECTED Final   Acinetobacter baumannii NOT DETECTED NOT DETECTED Final   Enterobacteriaceae species NOT DETECTED NOT DETECTED Final   Enterobacter cloacae complex NOT DETECTED NOT DETECTED Final   Escherichia coli NOT DETECTED NOT DETECTED Final   Klebsiella oxytoca NOT DETECTED NOT DETECTED Final   Klebsiella pneumoniae NOT DETECTED NOT DETECTED Final   Proteus species NOT DETECTED NOT DETECTED Final   Serratia marcescens NOT DETECTED NOT DETECTED Final   Haemophilus influenzae NOT DETECTED NOT DETECTED Final   Neisseria meningitidis NOT DETECTED NOT DETECTED Final   Pseudomonas aeruginosa NOT DETECTED NOT DETECTED Final   Candida albicans NOT DETECTED NOT DETECTED Final   Candida glabrata NOT DETECTED NOT DETECTED Final   Candida krusei NOT DETECTED NOT DETECTED Final   Candida parapsilosis NOT DETECTED NOT DETECTED Final   Candida tropicalis NOT DETECTED NOT DETECTED Final    Comment: Performed at [facility], [place]. [address], [place], [place] [address]  Culture, blood (routine x 2)  Call MD if unable to obtain prior to antibiotics being given     Status: None (Preliminary result)   Collection Time: 07/26/2017  8:35 PM  Result Value Ref [place] Status   Specimen Description   Final    BLOOD LEFT ANTECUBITAL Performed at [facility] [address] [place], [place] [address]    Special Requests   Final    BOTTLES DRAWN AEROBIC AND ANAEROBIC Blood Culture adequate volume Performed at [facility] [address] [place] [address], [place], [place] [address]    Culture   Final    NO GROWTH 2 DAYS Performed at [facility] [address], [place], [place] [address]    Report Status PENDING  Incomplete  Culture, blood (routine x 2) Call MD if unable to obtain prior to antibiotics being given     Status: None (Preliminary result)   Collection Time: 07/30/2017  9:00 PM  Result Value Ref [place] Status   Specimen Description   Final    BLOOD LEFT ARM Performed at [facility] [address], [place], [place] [address]    Special Requests   Final    BOTTLES  DRAWN AEROBIC AND ANAEROBIC Blood Culture adequate volume Performed at [facility] [address] [place], [place] [address]    Culture   Final    NO GROWTH 2 DAYS Performed at [facility] [address], [place], [place] [address]    Report Status PENDING  Incomplete  Respiratory Panel by PCR     Status: None   Collection Time: 07/27/2017  9:42 PM  Result Value Ref [place] Status   Adenovirus NOT DETECTED NOT DETECTED Final   Coronavirus 229E NOT DETECTED NOT DETECTED Final   Coronavirus HKU1 NOT DETECTED NOT DETECTED Final   Coronavirus NL63 NOT DETECTED NOT DETECTED Final   Coronavirus OC43 NOT DETECTED NOT DETECTED Final   Metapneumovirus NOT DETECTED NOT DETECTED Final   Rhinovirus / Enterovirus NOT DETECTED NOT DETECTED Final   Influenza A NOT DETECTED NOT DETECTED Final   Influenza B NOT DETECTED NOT DETECTED Final   Parainfluenza  Virus 1 NOT DETECTED NOT DETECTED Final   Parainfluenza Virus 2 NOT DETECTED NOT DETECTED Final   Parainfluenza Virus 3 NOT DETECTED NOT DETECTED Final   Parainfluenza Virus 4 NOT DETECTED NOT DETECTED Final   Respiratory Syncytial Virus NOT DETECTED NOT DETECTED Final   Bordetella pertussis NOT DETECTED NOT DETECTED Final   Chlamydophila pneumoniae NOT DETECTED NOT DETECTED Final   Mycoplasma pneumoniae NOT DETECTED NOT DETECTED Final    Comment: Performed at [facility], [place] [address] [place], [place] [address]  MRSA PCR Screening     Status: None   Collection Time: 07/14/2017  9:43 PM  Result Value Ref [place] Status   MRSA by PCR NEGATIVE NEGATIVE Final    Comment:        The GeneXpert MRSA Assay (FDA approved for NASAL specimens only), is one component of a comprehensive MRSA colonization surveillance program. It is not intended to diagnose MRSA infection nor to guide or monitor treatment for MRSA infections. Performed at [facility], [place] [address], [place], [place] [address]   Culture, blood (Routine X 2) w Reflex to ID Panel     Status: None (Preliminary result)   Collection Time: 07/17/17 12:06 PM  Result Value Ref [place] Status   Specimen Description   Final    BLOOD RIGHT HAND Performed at [facility] [address], [place], [place] [address]    Special Requests   Final    BOTTLES DRAWN AEROBIC AND ANAEROBIC Blood Culture adequate volume Performed at [facility] [address] [place], [place] [address]    Culture   Final    NO GROWTH < 24 HOURS Performed at [facility] [address], [place], [place] [address]    Report Status PENDING  Incomplete  Culture, blood (Routine X 2) w Reflex to ID Panel     Status: None (Preliminary result)   Collection Time: 07/17/17 12:07 PM  Result Value Ref [place] Status   Specimen Description   Final    BLOOD LEFT HAND Performed at [facility] [address], [place], [place] [address]    Special Requests   Final    BOTTLES DRAWN AEROBIC ONLY Blood Culture adequate volume Performed at [facility] [address] [place], [place] [address]    Culture   Final    NO GROWTH < 24 HOURS Performed at [facility] [address] [place], [place] [address]    Report  Status PENDING  Incomplete    Radiology Reports Ct Abdomen Pelvis Wo Contrast  Result Date: 08/05/2017 CLINICAL DATA:  Hypotension and abdominal pain EXAM: CT ABDOMEN AND PELVIS WITHOUT CONTRAST TECHNIQUE: Multidetector CT imaging of the abdomen and pelvis was performed following the standard protocol without IV contrast. COMPARISON:  None. FINDINGS: Lower chest: Lung bases are well aerated with the exception of mild lingular infiltrate adjacent to the heart. Hepatobiliary: No focal liver abnormality is seen. Status post cholecystectomy. No biliary dilatation. Pancreas: Unremarkable. No pancreatic ductal dilatation or surrounding inflammatory changes. Spleen: Normal in size without focal abnormality. Adrenals/Urinary Tract: The adrenals are within normal limits. Small nonobstructing stones are noted within the left kidney. The collecting systems are otherwise within normal limits. The bladder is partially decompressed. Stomach/Bowel: Postsurgical changes are noted in the stomach. A sliding-type hiatal hernia is noted. 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 The appendix is within normal limits. Vascular/Lymphatic: No significant vascular findings are present. No enlarged abdominal or pelvic lymph nodes. Reproductive: Uterus and bilateral adnexa are unremarkable. Other: No abdominal wall hernia or abnormality. No abdominopelvic ascites. Musculoskeletal: Degenerative changes of lumbar spine are noted. IMPRESSION: Nonobstructing left renal stones. Postoperative changes as described. Mild lingular infiltrate. Electronically Signed   By:  Inez Catalina M.D.   On: 07/28/2017 15:49   Dg Lumbar Spine Complete  Result Date: 07/11/2017 CLINICAL DATA:  Low back pain into hips for 2 days, fell 1 week ago EXAM: LUMBAR SPINE - COMPLETE 4+ VIEW COMPARISON:  CT abdomen and pelvis 11/17/2010 FINDINGS: Osseous demineralization. Five non-rib-bearing lumbar vertebra. Minimal biconvex thoracolumbar scoliosis. Multilevel degenerative disc disease changes with disc space narrowing and endplate spur formation. Facet degenerative changes lower lumbar spine. Vertebral body heights maintained without fracture or bone destruction. Minimal anterolisthesis at L4-L5 and retrolisthesis at L2-L3 noted. No definite spondylolysis. SI joints preserved. IMPRESSION: Osseous demineralization with degenerative disc and facet disease changes of the lumbar spine with associated biconvex thoracolumbar scoliosis and minimal listhesis. No acute abnormalities. Electronically Signed   By: Lavonia Dana M.D.   On: 07/11/2017 15:05   Ct Head Wo Contrast  Result Date: 07/22/2017 CLINICAL DATA:  Altered level of consciousness over the last 2 days, fatigue, hypotension EXAM: CT HEAD WITHOUT CONTRAST TECHNIQUE: Contiguous axial images were obtained from the base of the skull through the vertex without intravenous contrast. COMPARISON:  MR brain scan of 05/12/2014 FINDINGS: Brain: The ventricular system is normal in size and configuration and the septum is in a normal midline position. The fourth ventricle and basilar cisterns are unremarkable. No hemorrhage, mass lesion, or acute infarction is seen. 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 Vascular: No vascular abnormality is seen on this unenhanced study. Skull: On bone window images, changes of prior left mastoidectomy are noted. No acute calvarial abnormality is seen. Sinuses/Orbits: The paranasal sinuses appear well pneumatized. Other: None. IMPRESSION: 1. No acute intracranial abnormality. 2. Old left  mastoidectomy after resection of left acoustic neuroma in 2007. Electronically Signed   By: Ivar Drape M.D.   On: 07/13/2017 15:45   Ct Lumbar Spine Wo Contrast  Result Date: 07/14/2017 CLINICAL DATA:  Initial evaluation for low back pain with bilateral leg pain. EXAM: CT LUMBAR SPINE WITHOUT CONTRAST TECHNIQUE: Multidetector CT imaging of the lumbar spine was performed without intravenous contrast administration. Multiplanar CT image reconstructions were also generated. COMPARISON:  Prior radiograph 07/11/2017. FINDINGS: Segmentation: Normal segmentation. Lowest well-formed disc labeled the L5-S1 level. Alignment:  3 mm retrolisthesis of L2 on L3, with up to 4 mm retrolisthesis of L4 on L5. Findings are likely chronic and facet mediated. Mild levoscoliosis, apex at L3. Vertebrae: Vertebral body heights maintained without evidence for acute or chronic fracture. Chronic reactive endplate changes present about the right aspect of the L3-4 interspace due to scoliotic curvature. No discrete lytic or blastic osseous lesions. Visualized sacrum and pelvis unremarkable. Paraspinal and other soft tissues: No acute paraspinous soft tissue abnormality. Patient status post gastric bypass. Punctate nonobstructive left renal nephrolithiasis. Disc levels: L1-2: Mild diffuse disc bulge, asymmetric to the right. Mild bilateral facet hypertrophy. No significant canal stenosis. Mild right L1 foraminal narrowing. L2-3: Retrolisthesis. Chronic intervertebral disc space narrowing with disc bulge. Mild facet hypertrophy. Resultant mild canal with moderate bilateral L2 foraminal stenosis. L3-4: Chronic intervertebral disc space narrowing with diffuse disc bulge. Associated disc desiccation noted. Moderate facet hypertrophy. Resultant moderate canal with bilateral lateral recess narrowing. Severe right with moderate left L3 foraminal stenosis. L4-5: Anterolisthesis. Diffuse disc bulge, slightly asymmetric to the right. Severe facet  arthropathy. Resultant severe canal with bilateral subarticular stenosis. Moderate bilateral L4 foraminal narrowing, slightly worse on the left. L5-S1: Diffuse disc bulge with intervertebral disc space narrowing. Severe left with moderate right facet arthrosis. Resultant mild left lateral recess narrowing without significant canal stenosis. Moderate left L5 foraminal narrowing. No significant right foraminal encroachment. IMPRESSION: 1. No acute abnormality within the lumbar spine. 2. Levoscoliosis with multilevel degenerative spondylolysis with resultant multilevel canal narrowing as above, moderate at L3-4 and severe at L4-5. 3. Multifactorial degenerative changes with resultant multilevel foraminal narrowing as above. 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 4. Punctate nonobstructive left renal nephrolithiasis. Electronically Signed   By: Jeannine Boga M.D.   On: 07/14/2017 20:24   Mr Brain Wo Contrast  Result Date: 07/17/2017 CLINICAL DATA:  Worsening mental status. 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 EXAM: MRI HEAD WITHOUT CONTRAST TECHNIQUE: Multiplanar, multiecho pulse sequences of the brain and surrounding structures were obtained without intravenous contrast. COMPARISON:  Head CT 07/26/2017.  MRI 05/12/2014. FINDINGS: Brain: Diffusion imaging does not show any acute or subacute infarction. There has been previous left mastoidectomy which appears the same as on previous exams. No evidence of any abnormality of the brainstem, cerebellum or cerebral hemispheres. No old infarction, mass lesion, hemorrhage, hydrocephalus or extra-axial collection. Vascular: Major vessels at the base of the brain show flow. Skull and upper cervical spine: Otherwise negative. Sinuses/Orbits: Clear/normal Other: None IMPRESSION: No acute  or reversible finding. No abnormality seen to explain worsening mental status. Distant left mastoidectomy without complicating feature. Electronically Signed   By: Nelson Chimes M.D.   On: 07/17/2017 07:01   Mr Thoracic Spine Wo Contrast  Result Date: 07/17/2017 CLINICAL DATA:  Altered mental status. Sepsis with elevated white blood cell count. Possible meningitis. EXAM: MRI THORACIC AND LUMBAR SPINE WITHOUT CONTRAST TECHNIQUE: Multiplanar and multiecho pulse sequences of the thoracic and lumbar spine were obtained without intravenous contrast. COMPARISON:  CT abdomen and pelvis 07/30/2017. FINDINGS: MRI THORACIC SPINE FINDINGS Alignment: There is exaggeration of the normal thoracic kyphosis. No listhesis. Vertebrae: Vertebral body height and signal are maintained. No evidence of discitis or osteomyelitis. No fracture or worrisome lesion. Cord:  Normal signal throughout. Paraspinal and other soft tissues: A 0.8 cm nodular opacity is seen posteriorly in the right lung on image 12  of series 8. Small bilateral pleural effusions are identified. The patient also has a small hiatal hernia. Disc levels: The central spinal canal and neural foramina are patent at all levels. Small central disc protrusion at T7-8 is noted. MRI LUMBAR SPINE FINDINGS Segmentation:  Standard. Alignment: Trace retrolisthesis L2 on L3 is identified. There is convex left scoliosis. Vertebrae: No fracture or worrisome lesion. No evidence of infectious process. Conus medullaris and cauda equina: Conus extends to the L1-2 level. Conus and cauda equina appear normal. Paraspinal and other soft tissues: Negative. Disc levels: L1-2: Minimal disc bulge without stenosis. L2-3: Shallow disc bulge is identified. Loss of disc space height is noted. The central canal is mildly narrowed. The foramina are open. L3-4: There is loss of disc space height, a shallow disc bulge and vacuum disc phenomenon. The central canal and foramina remain open. L4-5: Bilateral  facet degenerative change with associated small effusions. Shallow disc bulge. The central canal and foramina are open. L5-S1: Shallow disc bulge without stenosis. IMPRESSION: MR THORACIC SPINE IMPRESSION No acute abnormality.  Negative for source of infection. 0.7 cm nodular opacity in the right lung cannot be definitively characterized. Recommend CT chest in 6 months to ensure stability. MR LUMBAR SPINE IMPRESSION No acute abnormality.  Negative for source of infection. Mild central canal narrowing L2-3 due to a shallow disc bulge. Scattered facet arthropathy appearing worst at L4-5. Electronically Signed   By: Inge Rise M.D.   On: 07/17/2017 08:06   Mr Lumbar Spine Wo Contrast  Result Date: 07/17/2017 CLINICAL DATA:  Altered mental status. Sepsis with elevated white blood cell count. Possible meningitis. EXAM: MRI THORACIC AND LUMBAR SPINE WITHOUT CONTRAST TECHNIQUE: Multiplanar and multiecho pulse sequences of the thoracic and lumbar spine were obtained without intravenous contrast. COMPARISON:  CT abdomen and pelvis 08/06/2017. FINDINGS: MRI THORACIC SPINE FINDINGS Alignment: There is exaggeration of the normal thoracic kyphosis. No listhesis. Vertebrae: Vertebral body height and signal are maintained. No evidence of discitis or osteomyelitis. No fracture or worrisome lesion. Cord:  Normal signal throughout. Paraspinal and other soft tissues: A 0.8 cm nodular opacity is seen posteriorly in the right lung on image 12 of series 8. Small bilateral pleural effusions are identified. The patient also has a small hiatal hernia. Disc levels: The central spinal canal and neural foramina are patent at all levels. Small central disc protrusion at T7-8 is noted. MRI LUMBAR SPINE FINDINGS Segmentation:  Standard. Alignment: Trace retrolisthesis L2 on L3 is identified. There is convex left scoliosis. Vertebrae: No fracture or worrisome lesion. No evidence of infectious process. Conus medullaris and cauda equina:  Conus extends to the L1-2 level. Conus and cauda equina appear normal. Paraspinal and other soft tissues: Negative. Disc levels: L1-2: Minimal disc bulge without stenosis. L2-3: Shallow disc bulge is identified. Loss of disc space height is noted. The central canal is mildly narrowed. The foramina are open. L3-4: There is loss of disc space height, a shallow disc bulge and vacuum disc phenomenon. The central canal and foramina remain open. L4-5: Bilateral facet degenerative change with associated small effusions. Shallow disc bulge. The central canal and foramina are open. L5-S1: Shallow disc bulge without stenosis. IMPRESSION: MR THORACIC SPINE IMPRESSION No acute abnormality.  Negative for source of infection. 0.7 cm nodular opacity in the right lung cannot be definitively characterized. Recommend CT chest in 6 months to ensure stability. MR LUMBAR SPINE IMPRESSION No acute abnormality.  Negative for source of infection. Mild central canal narrowing L2-3 due to a  shallow disc bulge. Scattered facet arthropathy appearing worst at L4-5. Electronically Signed   By: Inge Rise M.D.   On: 07/17/2017 08:06   Mr Ankle Right Wo Contrast  Result Date: 07/18/2017 CLINICAL DATA:  Blood cultures positive for MRSA. Pain and swelling of both ankles. EXAM: MRI OF THE RIGHT ANKLE WITHOUT CONTRAST TECHNIQUE: Multiplanar, multisequence MR imaging of the ankle was performed. No intravenous contrast was administered. COMPARISON:  None. FINDINGS: TENDONS Peroneal: Intact. Posteromedial: Intact. Anterior: Intact. Achilles: The distal tendon is somewhat thickened with calcifications within it consistent with chronic tendinosis. Mild edema in a calcaneal spur at the tendon insertion is noted. Plantar Fascia: Intact.  Very mild edema is seen in the medial cord. LIGAMENTS Lateral: Intact. Medial: Intact. CARTILAGE Ankle Joint: Normal. Subtalar Joints/Sinus Tarsi: Normal. Bones: No marrow signal abnormality to suggest  osteomyelitis. No fracture or focal lesion. Other: Subcutaneous edema is present about the ankle. IMPRESSION: Subcutaneous edema about the ankle could be due to dependent change and/or cellulitis. Negative for abscess, septic joint or osteomyelitis. Chronic Achilles tendinopathy. Mild plantar fasciitis of the medial cord. Electronically Signed   By: Inge Rise M.D.   On: 07/18/2017 12:16   Mr Ankle Left Wo Contrast  Result Date: 07/18/2017 CLINICAL DATA:  Blood cultures positive for MRSA. Pain and swelling of both ankles. EXAM: MRI OF THE LEFT ANKLE WITHOUT CONTRAST TECHNIQUE: Multiplanar, multisequence MR imaging of the ankle was performed. No intravenous contrast was administered. COMPARISON:  None. FINDINGS: TENDONS Peroneal: Intact. Posteromedial: Intact. Anterior: Intact. Achilles: Intact. The distal tendon is thickened with calcifications within it. Mild marrow edema is seen in a calcaneal spur at the Achilles tendon insertion. Plantar Fascia: Unremarkable. LIGAMENTS Lateral: Intact. Medial: Intact. CARTILAGE Ankle Joint: Normal. Subtalar Joints/Sinus Tarsi: Unremarkable. Bones: Normal marrow signal throughout. No evidence of osteomyelitis. Other: Subcutaneous edema is present about the ankle. No focal fluid collection. IMPRESSION: Subcutaneous edema about the ankle could be due to dependent change or cellulitis. Negative for osteomyelitis, abscess or septic joint. Chronic Achilles tendinopathy. Electronically Signed   By: Inge Rise M.D.   On: 07/18/2017 12:38   Dg Chest Port 1 View  Result Date: 08/02/2017 CLINICAL DATA:  Cough. EXAM: PORTABLE CHEST 1 VIEW COMPARISON:  Chest x-ray dated June 13, 2017. FINDINGS: The heart size and mediastinal contours are within normal limits. Normal pulmonary vascularity. Resolved consolidation in the lingula with residual scarring/atelectasis. No focal consolidation, pleural effusion, or pneumothorax. No acute osseous abnormality. IMPRESSION: No active  disease. Electronically Signed   By: Titus Dubin M.D.   On: 08/03/2017 13:11    Time Spent in minutes  30   Velvet Bathe M.D on 07/18/2017 at 1:11 PM  Between 7am to 7pm - Pager [phone]  After 7pm go to [email or web address] - password [facility]  [facility] -  Office  [phone]

## 2017-07-18 NOTE — Progress Notes (Signed)
    [facility] has been requested to perform a transesophageal echocardiogram on 05/09 for atrial fib.  After careful review of history and examination, the risks and benefits of transesophageal echocardiogram have been explained including risks of esophageal damage, perforation (1:10,000 risk), bleeding, pharyngeal hematoma as well as other potential complications associated with conscious sedation including aspiration, arrhythmia, respiratory failure and death. Alternatives to treatment were discussed, questions were answered. Patient is willing to proceed.   Rosaria Ferries, PA-C 07/18/2017 3:09 PM

## 2017-07-18 NOTE — Progress Notes (Signed)
Progress Note  Patient Name: Theresa Morrow Date of Encounter: 07/18/2017  Primary Cardiologist: No primary care provider on file.   Subjective   Remains oriented and generally feeling just a little bit better.  Appetite is still poor. Interval development is blood cultures positive for Staphylococcus aureus, so far only 1 of the 4 cultures drawn on May 6. Remains in atrial fibrillation with borderline ventricular rate control, otherwise hemodynamically well compensated Transthoracic echocardiogram shows mild left atrial enlargement and mild valvular abnormalities, normal left ventricular systolic function and no evidence of vegetations.  Inpatient Medications    Scheduled Meds: . aspirin  325 mg Oral Daily  . methocarbamol  500 mg Oral BID  . pantoprazole  40 mg Oral Daily   Continuous Infusions: . diltiazem (CARDIZEM) infusion 10 mg/hr (07/18/17 0215)  . heparin 1,500 Units/hr (07/18/17 0615)  . vancomycin     PRN Meds: acetaminophen, morphine injection, morphine injection   Vital Signs    Vitals:   07/18/17 0300 07/18/17 0309 07/18/17 0400 07/18/17 0810  BP: 106/74  105/65   Pulse: (!) 101  79   Resp: 16  20   Temp:  (!) 97.2 F (36.2 C)  97.9 F (36.6 C)  TempSrc:  Oral  Oral  SpO2: 99%  96%   Weight:      Height:        Intake/Output Summary (Last 24 hours) at 07/18/2017 0958 Last data filed at 07/18/2017 0400 Gross per 24 hour  Intake 1978.56 ml  Output 250 ml  Net 1728.56 ml   Filed Weights   07/31/2017 1234  Weight: 182 lb (82.6 kg)    Telemetry    Atrial fibrillation with ventricular rates in the 100-110 range- Personally Reviewed  ECG    No new tracing- Personally Reviewed  Physical Exam  Appears ill, but comfortable at rest GEN: No acute distress.   Neck: No JVD Cardiac:  Irregular, no murmurs, rubs, or gallops.  Respiratory: Clear to auscultation bilaterally. GI: Soft, nontender, non-distended  MS: No edema; No deformity. Neuro:   Nonfocal  Psych: Normal affect   Labs    Chemistry Recent Labs  Lab 07/20/2017 1238  07/17/2017 2248 07/17/17 0443 07/18/17 0311  NA 138  --  138  --  138  K 4.0  --  3.8  --  4.3  CL 104  --  111  --  111  CO2 18*  --  17*  --  17*  GLUCOSE 72  --  148*  --  129*  BUN 63*  --  56*  --  53*  CREATININE 2.56*   < > 1.83* 1.72* 1.36*  CALCIUM 8.5*  --  7.7*  --  7.8*  PROT 6.2*  --  5.1*  --  5.4*  ALBUMIN 2.7*  --  2.1*  --  2.1*  AST 72*  --  49*  --  30  ALT 39  --  32  --  26  ALKPHOS 161*  --  129*  --  153*  BILITOT 1.2  --  1.1  --  0.8  GFRNONAA 19*   < > 28* 31* 41*  GFRAA 22*   < > 33* 36* 47*  ANIONGAP 16*  --  10  --  10   < > = values in this interval not displayed.     Hematology Recent Labs  Lab 07/20/2017 2248 07/17/17 0443 07/18/17 0311  WBC 23.7* 25.2* 28.4*  RBC 4.17 3.98  3.85*  HGB 11.6* 11.0* 10.8*  HCT 35.3* 33.7* 32.6*  MCV 84.7 84.7 84.7  MCH 27.8 27.6 28.1  MCHC 32.9 32.6 33.1  RDW 16.4* 16.3* 16.7*  PLT 129* 122* 144*    Cardiac Enzymes Recent Labs  Lab 08/09/2017 1733 07/11/2017 2248 07/17/17 0443  TROPONINI <0.03 0.03* 0.03*   No results for input(s): TROPIPOC in the last 168 hours.   BNPNo results for input(s): BNP, PROBNP in the last 168 hours.   DDimer  Recent Labs  Lab 08/05/2017 1748  DDIMER 7.82*     Radiology    Ct Abdomen Pelvis Wo Contrast  Result Date: 07/22/2017 CLINICAL DATA:  Hypotension and abdominal pain EXAM: CT ABDOMEN AND PELVIS WITHOUT CONTRAST TECHNIQUE: Multidetector CT imaging of the abdomen and pelvis was performed following the standard protocol without IV contrast. COMPARISON:  None. FINDINGS: Lower chest: Lung bases are well aerated with the exception of mild lingular infiltrate adjacent to the heart. Hepatobiliary: No focal liver abnormality is seen. Status post cholecystectomy. No biliary dilatation. Pancreas: Unremarkable. No pancreatic ductal dilatation or surrounding inflammatory changes. Spleen: Normal  in size without focal abnormality. Adrenals/Urinary Tract: The adrenals are within normal limits. Small nonobstructing stones are noted within the left kidney. The collecting systems are otherwise within normal limits. The bladder is partially decompressed. Stomach/Bowel: Postsurgical changes are noted in the stomach. A sliding-type hiatal hernia is noted. 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 The appendix is within normal limits. Vascular/Lymphatic: No significant vascular findings are present. No enlarged abdominal or pelvic lymph nodes. Reproductive: Uterus and bilateral adnexa are unremarkable. Other: No abdominal wall hernia or abnormality. No abdominopelvic ascites. Musculoskeletal: Degenerative changes of lumbar spine are noted. IMPRESSION: Nonobstructing left renal stones. Postoperative changes as described. Mild lingular infiltrate. Electronically Signed   By: Inez Catalina M.D.   On: 07/24/2017 15:49   Ct Head Wo Contrast  Result Date: 08/02/2017 CLINICAL DATA:  Altered level of consciousness over the last 2 days, fatigue, hypotension EXAM: CT HEAD WITHOUT CONTRAST TECHNIQUE: Contiguous axial images were obtained from the base of the skull through the vertex without intravenous contrast. COMPARISON:  MR brain scan of 05/12/2014 FINDINGS: Brain: The ventricular system is normal in size and configuration and the septum is in a normal midline position. The fourth ventricle and basilar cisterns are unremarkable. No hemorrhage, mass lesion, or acute infarction is seen. 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 Vascular: No vascular abnormality is seen on this unenhanced study. Skull: On bone window images, changes of prior left mastoidectomy are noted. No acute calvarial abnormality is seen. Sinuses/Orbits: The paranasal sinuses appear well pneumatized. Other: None. IMPRESSION: 1. No acute intracranial abnormality. 2. Old left mastoidectomy after resection of  left acoustic neuroma in 2007. Electronically Signed   By: Ivar Drape M.D.   On: 07/25/2017 15:45   Mr Brain Wo Contrast  Result Date: 07/17/2017 CLINICAL DATA:  Worsening mental status. 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 EXAM: MRI HEAD WITHOUT CONTRAST TECHNIQUE: Multiplanar, multiecho pulse sequences of the brain and surrounding structures were obtained without intravenous contrast. COMPARISON:  Head CT 07/22/2017.  MRI 05/12/2014. FINDINGS: Brain: Diffusion imaging does not show any acute or subacute infarction. There has been previous left mastoidectomy which appears the same as on previous exams. No evidence of any abnormality of the brainstem, cerebellum or cerebral hemispheres. No old infarction, mass lesion, hemorrhage, hydrocephalus or extra-axial collection. Vascular: Major vessels at the base  of the brain show flow. Skull and upper cervical spine: Otherwise negative. Sinuses/Orbits: Clear/normal Other: None IMPRESSION: No acute or reversible finding. No abnormality seen to explain worsening mental status. Distant left mastoidectomy without complicating feature. Electronically Signed   By: Nelson Chimes M.D.   On: 07/17/2017 07:01   Mr Thoracic Spine Wo Contrast  Result Date: 07/17/2017 CLINICAL DATA:  Altered mental status. Sepsis with elevated white blood cell count. Possible meningitis. EXAM: MRI THORACIC AND LUMBAR SPINE WITHOUT CONTRAST TECHNIQUE: Multiplanar and multiecho pulse sequences of the thoracic and lumbar spine were obtained without intravenous contrast. COMPARISON:  CT abdomen and pelvis 07/18/2017. FINDINGS: MRI THORACIC SPINE FINDINGS Alignment: There is exaggeration of the normal thoracic kyphosis. No listhesis. Vertebrae: Vertebral body height and signal are maintained. No evidence of discitis or osteomyelitis. No fracture or worrisome lesion. Cord:  Normal signal throughout. Paraspinal and other soft tissues: A 0.8 cm nodular opacity is seen posteriorly in  the right lung on image 12 of series 8. Small bilateral pleural effusions are identified. The patient also has a small hiatal hernia. Disc levels: The central spinal canal and neural foramina are patent at all levels. Small central disc protrusion at T7-8 is noted. MRI LUMBAR SPINE FINDINGS Segmentation:  Standard. Alignment: Trace retrolisthesis L2 on L3 is identified. There is convex left scoliosis. Vertebrae: No fracture or worrisome lesion. No evidence of infectious process. Conus medullaris and cauda equina: Conus extends to the L1-2 level. Conus and cauda equina appear normal. Paraspinal and other soft tissues: Negative. Disc levels: L1-2: Minimal disc bulge without stenosis. L2-3: Shallow disc bulge is identified. Loss of disc space height is noted. The central canal is mildly narrowed. The foramina are open. L3-4: There is loss of disc space height, a shallow disc bulge and vacuum disc phenomenon. The central canal and foramina remain open. L4-5: Bilateral facet degenerative change with associated small effusions. Shallow disc bulge. The central canal and foramina are open. L5-S1: Shallow disc bulge without stenosis. IMPRESSION: MR THORACIC SPINE IMPRESSION No acute abnormality.  Negative for source of infection. 0.7 cm nodular opacity in the right lung cannot be definitively characterized. Recommend CT chest in 6 months to ensure stability. MR LUMBAR SPINE IMPRESSION No acute abnormality.  Negative for source of infection. Mild central canal narrowing L2-3 due to a shallow disc bulge. Scattered facet arthropathy appearing worst at L4-5. Electronically Signed   By: Inge Rise M.D.   On: 07/17/2017 08:06   Mr Lumbar Spine Wo Contrast  Result Date: 07/17/2017 CLINICAL DATA:  Altered mental status. Sepsis with elevated white blood cell count. Possible meningitis. EXAM: MRI THORACIC AND LUMBAR SPINE WITHOUT CONTRAST TECHNIQUE: Multiplanar and multiecho pulse sequences of the thoracic and lumbar spine  were obtained without intravenous contrast. COMPARISON:  CT abdomen and pelvis 07/24/2017. FINDINGS: MRI THORACIC SPINE FINDINGS Alignment: There is exaggeration of the normal thoracic kyphosis. No listhesis. Vertebrae: Vertebral body height and signal are maintained. No evidence of discitis or osteomyelitis. No fracture or worrisome lesion. Cord:  Normal signal throughout. Paraspinal and other soft tissues: A 0.8 cm nodular opacity is seen posteriorly in the right lung on image 12 of series 8. Small bilateral pleural effusions are identified. The patient also has a small hiatal hernia. Disc levels: The central spinal canal and neural foramina are patent at all levels. Small central disc protrusion at T7-8 is noted. MRI LUMBAR SPINE FINDINGS Segmentation:  Standard. Alignment: Trace retrolisthesis L2 on L3 is identified. There is convex left scoliosis. Vertebrae:  No fracture or worrisome lesion. No evidence of infectious process. Conus medullaris and cauda equina: Conus extends to the L1-2 level. Conus and cauda equina appear normal. Paraspinal and other soft tissues: Negative. Disc levels: L1-2: Minimal disc bulge without stenosis. L2-3: Shallow disc bulge is identified. Loss of disc space height is noted. The central canal is mildly narrowed. The foramina are open. L3-4: There is loss of disc space height, a shallow disc bulge and vacuum disc phenomenon. The central canal and foramina remain open. L4-5: Bilateral facet degenerative change with associated small effusions. Shallow disc bulge. The central canal and foramina are open. L5-S1: Shallow disc bulge without stenosis. IMPRESSION: MR THORACIC SPINE IMPRESSION No acute abnormality.  Negative for source of infection. 0.7 cm nodular opacity in the right lung cannot be definitively characterized. Recommend CT chest in 6 months to ensure stability. MR LUMBAR SPINE IMPRESSION No acute abnormality.  Negative for source of infection. Mild central canal narrowing L2-3  due to a shallow disc bulge. Scattered facet arthropathy appearing worst at L4-5. Electronically Signed   By: Inge Rise M.D.   On: 07/17/2017 08:06   Dg Chest Port 1 View  Result Date: 08/02/2017 CLINICAL DATA:  Cough. EXAM: PORTABLE CHEST 1 VIEW COMPARISON:  Chest x-ray dated June 13, 2017. FINDINGS: The heart size and mediastinal contours are within normal limits. Normal pulmonary vascularity. Resolved consolidation in the lingula with residual scarring/atelectasis. No focal consolidation, pleural effusion, or pneumothorax. No acute osseous abnormality. IMPRESSION: No active disease. Electronically Signed   By: Titus Dubin M.D.   On: 07/15/2017 13:11    Cardiac Studies   Echo Jul 17, 2017  - Left ventricle: The cavity size was normal. Wall thickness was   normal. Systolic function was vigorous. The estimated ejection   fraction was in the range of 65% to 70%. Wall motion was normal;   there were no regional wall motion abnormalities. - Aortic valve: There was mild regurgitation. - Mitral valve: There was mild regurgitation. - Left atrium: The atrium was mildly dilated. - Pulmonary arteries: PA peak pressure: 31 mm Hg (S).  Impressions:  - Vigorous LV systolic function; mild AI and MR; mild LAE; mild TR.  Patient Profile     62 y.o. female presenting with sepsis, staph bacteremia, new onset atrial fibrillation with rapid ventricular response that has now persisted for well over 24 hours.  Assessment & Plan    1. AFib: She does have mild left atrial enlargement on echo, but no other abnormalities of note and no chronic cardiac risk factors.  Suspect that the arrhythmia is related to the acute illness.  She is getting better and is more likely to maintain normal rhythm after cardioversion at this point.  If she does not convert to atrial fibrillation by tomorrow, I think she will be best served by cardioversion. 2. S.aureus bacteremia: We will perform a TEE tomorrow, around  10:30 AM, to be followed by cardioversion if she is still in atrial fibrillation.  Discussed with Dr. Londell Moh, will perform at the bedside at [facility] and he will assist with sedation. 3. Sepsis:  Resolved 4. AKI: Continues to improve  For questions or updates, please contact [place] Please consult [email or web address] for contact info under Cardiology/STEMI.      Signed, Sanda Klein, MD  07/18/2017, 9:58 AM

## 2017-07-18 NOTE — Progress Notes (Signed)
ANTICOAGULATION CONSULT NOTE - Follow Up Consult  Pharmacy Consult for Heparin Indication: atrial fibrillation  No Known Allergies  Patient Measurements: Height: 5\' 7"  (170.2 cm) Weight: 182 lb (82.6 kg) IBW/kg (Calculated) : 61.6 Heparin Dosing Weight: 78.7 kg  Vital Signs: Temp: 98 F (36.7 C) (05/08 2016) Temp Source: Oral (05/08 2016) BP: 129/76 (05/08 2000) Pulse Rate: 84 (05/08 2000)  Labs: Recent Labs    08/04/2017 1733 08/09/2017 2032 07/31/2017 2248 07/17/17 0443  07/18/17 0311 07/18/17 1330 07/18/17 2040  HGB  --   --  11.6* 11.0*  --  10.8*  --   --   HCT  --   --  35.3* 33.7*  --  32.6*  --   --   PLT  --   --  129* 122*  --  144*  --   --   APTT  --   --  37*  --   --   --   --   --   LABPROT  --   --  14.4  --   --   --   --   --   INR  --   --  1.13  --   --   --   --   --   HEPARINUNFRC  --   --   --   --    < > 0.15* 0.12* 0.29*  CREATININE  --  2.02* 1.83* 1.72*  --  1.36*  --   --   CKTOTAL  --  79  --   --   --   --   --   --   CKMB  --  10.4*  --   --   --   --   --   --   TROPONINI <0.03  --  0.03* 0.03*  --   --   --   --    < > = values in this interval not displayed.    Estimated Creatinine Clearance: 47.4 mL/min (A) (by C-G formula based on SCr of 1.36 mg/dL (H)).   Medical History: Past Medical History:  Diagnosis Date  . Anemia   . Bile duct stone    liver stones  . Cancer [facility])    brain tumor 2007  . Cholangitis   . Choledocholithiasis   . Chronic headaches   . Complication of anesthesia    low heart rate  . GERD (gastroesophageal reflux disease)   . Hepatomegaly   . Kidney stones   . Obesity   . Osteoarthritis     Medications:  Scheduled:  . aspirin  325 mg Oral Daily  . mouth rinse  15 mL Mouth Rinse BID  . methocarbamol  500 mg Oral BID  . pantoprazole  40 mg Oral Daily   Infusions:  . diltiazem (CARDIZEM) infusion 10 mg/hr (07/18/17 1555)  . heparin 1,800 Units/hr (07/18/17 1437)  . vancomycin Stopped (07/18/17  1505)    Assessment: 41 yoF admitted on 5/6 with hypotension, AMS, hallucinations.  Currently being treated for sepsis, pneumonia, and bacteremia.  She also developed new onset Afib with RVR, first on 5/6.  Pharmacy is consulted to dose Heparin IV.   Today, 07/18/2017  2040 heparin level = 0.29 units/mL, remains slightly subtherapeutic after 4000 unit IV bolus and rate increase to 1800 units/hr  CBC: Hgb 10.8, Plt 144 (near baseline ~120s prior to starting heparin)  SCr slowly improving to 1.36  No bleeding or complications reported  Goal of Therapy:  Heparin level 0.3-0.7 units/ml Monitor platelets by anticoagulation protocol: Yes   Plan:   Increase IV heparin infusion to 1950 units/hr  Heparin level 6 hours after rate change  Daily heparin level and CBC  Monitor closely for s/sx of bleeding   Lindell Spar, PharmD, BCPS Pager: [phone] 07/18/2017 09:42 PM

## 2017-07-18 NOTE — Progress Notes (Signed)
ANTICOAGULATION CONSULT NOTE - Follow Up Consult  Pharmacy Consult for Heparin Indication: atrial fibrillation  No Known Allergies  Patient Measurements: Height: 5\' 7"  (170.2 cm) Weight: 182 lb (82.6 kg) IBW/kg (Calculated) : 61.6 Heparin Dosing Weight: 78.7 kg  Vital Signs: Temp: 98.1 F (36.7 C) (05/08 1300) Temp Source: Oral (05/08 1300) BP: 119/89 (05/08 0700) Pulse Rate: 120 (05/08 0700)  Labs: Recent Labs    07/26/2017 1733 07/14/2017 2032 08/02/2017 2248 07/17/17 0443 07/17/17 1906 07/18/17 0311 07/18/17 1330  HGB  --   --  11.6* 11.0*  --  10.8*  --   HCT  --   --  35.3* 33.7*  --  32.6*  --   PLT  --   --  129* 122*  --  144*  --   APTT  --   --  37*  --   --   --   --   LABPROT  --   --  14.4  --   --   --   --   INR  --   --  1.13  --   --   --   --   HEPARINUNFRC  --   --   --   --  <0.10* 0.15* 0.12*  CREATININE  --  2.02* 1.83* 1.72*  --  1.36*  --   CKTOTAL  --  79  --   --   --   --   --   CKMB  --  10.4*  --   --   --   --   --   TROPONINI <0.03  --  0.03* 0.03*  --   --   --     Estimated Creatinine Clearance: 47.4 mL/min (A) (by C-G formula based on SCr of 1.36 mg/dL (H)).   Medical History: Past Medical History:  Diagnosis Date  . Anemia   . Bile duct stone    liver stones  . Cancer [facility])    brain tumor 2007  . Cholangitis   . Choledocholithiasis   . Chronic headaches   . Complication of anesthesia    low heart rate  . GERD (gastroesophageal reflux disease)   . Hepatomegaly   . Kidney stones   . Obesity   . Osteoarthritis     Medications:  Scheduled:  . aspirin  325 mg Oral Daily  . mouth rinse  15 mL Mouth Rinse BID  . methocarbamol  500 mg Oral BID  . pantoprazole  40 mg Oral Daily   Infusions:  . diltiazem (CARDIZEM) infusion 10 mg/hr (07/18/17 1057)  . heparin 1,500 Units/hr (07/18/17 0615)  . vancomycin 1,250 mg (07/18/17 1335)    Assessment: 61 yoF admitted on 5/6 with hypotension, AMS, hallucinations.  Currently  being treated for sepsis, pneumonia, and bacteremia.  She also developed new onset Afib RVR, first on 5/6.  Pharmacy is consulted to dose Heparin IV.   Today, 07/18/2017  Heparin level subtherapeutic (0.12) after 2500 unit IV bolus and rate increase 1500 units/hr  CBC: Hgb 10.8, Plt 144 (near baseline ~120s prior to starting heparin)  SCr slowly improving to 1.36  No bleeding or complications reported.  Goal of Therapy:  Heparin level 0.3-0.7 units/ml Monitor platelets by anticoagulation protocol: Yes   Plan:   Give heparin 4000 units bolus IV x 1  Increase heparin IV infusion at 1800 units/hr  Heparin level 6 hours after rate increase  Daily heparin level and CBC  Continue to monitor H&H and  platelets   Peggyann Juba, PharmD, BCPS Pager: [phone] 07/18/2017 2:01 PM

## 2017-07-18 NOTE — Progress Notes (Signed)
Subjective:  Patient with persistent pain in ankles, wrists and back   Antibiotics:  Anti-infectives (From admission, onward)   Start     Dose/Rate Route Frequency Ordered Stop   07/18/17 2000  vancomycin (VANCOCIN) IVPB 1000 mg/200 mL premix  Status:  Discontinued     1,000 mg 200 mL/hr over 60 Minutes Intravenous Every 48 hours 07/20/2017 1931 07/17/17 1154   07/18/17 1200  vancomycin (VANCOCIN) 1,250 mg in sodium chloride 0.9 % 250 mL IVPB     1,250 mg 166.7 mL/hr over 90 Minutes Intravenous Every 36 hours 07/18/17 0728     07/17/17 1400  ceFEPIme (MAXIPIME) 1 g in sodium chloride 0.9 % 100 mL IVPB  Status:  Discontinued     1 g 200 mL/hr over 30 Minutes Intravenous Every 24 hours 08/08/2017 1931 07/17/17 0901   07/17/17 1200  vancomycin (VANCOCIN) IVPB 1000 mg/200 mL premix  Status:  Discontinued     1,000 mg 200 mL/hr over 60 Minutes Intravenous Every 36 hours 07/17/17 1154 07/18/17 0728   07/17/17 1000  ceFEPIme (MAXIPIME) 1 g in sodium chloride 0.9 % 100 mL IVPB  Status:  Discontinued     1 g 200 mL/hr over 30 Minutes Intravenous Every 12 hours 07/17/17 0901 07/17/17 1149   08/02/2017 2000  vancomycin (VANCOCIN) 500 mg in sodium chloride 0.9 % 100 mL IVPB     500 mg 100 mL/hr over 60 Minutes Intravenous  Once 08/06/2017 1931 07/27/2017 2220   07/26/2017 1900  cefTRIAXone (ROCEPHIN) 1 g in sodium chloride 0.9 % 100 mL IVPB  Status:  Discontinued     1 g 200 mL/hr over 30 Minutes Intravenous Every 24 hours 08/10/2017 1849 07/24/2017 1901   07/12/2017 1900  azithromycin (ZITHROMAX) 500 mg in sodium chloride 0.9 % 250 mL IVPB  Status:  Discontinued     500 mg 250 mL/hr over 60 Minutes Intravenous Every 24 hours 07/20/2017 1849 07/15/2017 1901   07/24/2017 1300  piperacillin-tazobactam (ZOSYN) IVPB 3.375 g  Status:  Discontinued     3.375 g 100 mL/hr over 30 Minutes Intravenous  Once 08/02/2017 1245 08/03/2017 1257   07/20/2017 1300  vancomycin (VANCOCIN) IVPB 1000 mg/200 mL premix     1,000  mg 200 mL/hr over 60 Minutes Intravenous  Once 07/15/2017 1245 07/20/2017 1424   07/13/2017 1300  ceFEPIme (MAXIPIME) 2 g in sodium chloride 0.9 % 100 mL IVPB     2 g 200 mL/hr over 30 Minutes Intravenous  Once 07/13/2017 1300 07/27/2017 1346      Medications: Scheduled Meds: . aspirin  325 mg Oral Daily  . mouth rinse  15 mL Mouth Rinse BID  . methocarbamol  500 mg Oral BID  . pantoprazole  40 mg Oral Daily   Continuous Infusions: . diltiazem (CARDIZEM) infusion 10 mg/hr (07/18/17 1555)  . heparin 1,800 Units/hr (07/18/17 1437)  . vancomycin Stopped (07/18/17 1505)   PRN Meds:.acetaminophen, morphine injection, morphine injection    Objective: Weight change:   Intake/Output Summary (Last 24 hours) at 07/18/2017 1810 Last data filed at 07/18/2017 1600 Gross per 24 hour  Intake 663.89 ml  Output 1150 ml  Net -486.11 ml   Blood pressure 101/76, pulse 100, temperature 98.2 F (36.8 C), temperature source Oral, resp. rate 14, height 5\' 7"  (1.702 m), weight 182 lb (82.6 kg), SpO2 97 %. Temp:  [97.2 F (36.2 C)-98.2 F (36.8 C)] 98.2 F (36.8 C) (05/08 1623) Pulse Rate:  [64-144] 100 (05/08  1500) Resp:  [10-22] 14 (05/08 1500) BP: (92-119)/(65-89) 101/76 (05/08 1400) SpO2:  [91 %-99 %] 97 % (05/08 1500)  Physical Exam: General: Alert and awake, oriented x3,  HEENT: anicteric sclera, EOMI CVS irr irr rate  no murmur rubs or gallops Chest: clear to auscultation bilaterally, no wheezing, rales or rhonchi Abdomen: soft  nondistended,   Extremities/Skin:  Legs still edematous and erythema 07/18/17:      Neuro: nonfocal  CBC:  CBC Latest Ref Rng & Units 07/18/2017 07/17/2017 08/04/2017  WBC 4.0 - 10.5 K/uL 28.4(H) 25.2(H) 23.7(H)  Hemoglobin 12.0 - 15.0 g/dL 10.8(L) 11.0(L) 11.6(L)  Hematocrit 36.0 - 46.0 % 32.6(L) 33.7(L) 35.3(L)  Platelets 150 - 400 K/uL 144(L) 122(L) 129(L)      BMET Recent Labs    08/04/2017 2248 07/17/17 0443 07/18/17 0311  NA 138  --  138  K 3.8  --   4.3  CL 111  --  111  CO2 17*  --  17*  GLUCOSE 148*  --  129*  BUN 56*  --  53*  CREATININE 1.83* 1.72* 1.36*  CALCIUM 7.7*  --  7.8*     Liver Panel  Recent Labs    07/29/2017 2248 07/18/17 0311  PROT 5.1* 5.4*  ALBUMIN 2.1* 2.1*  AST 49* 30  ALT 32 26  ALKPHOS 129* 153*  BILITOT 1.1 0.8       Sedimentation Rate Recent Labs    08/02/2017 1238  ESRSEDRATE 32*   C-Reactive Protein Recent Labs    08/02/2017 2032  CRP 36.7*    Micro Results: Recent Results (from the past 720 hour(s))  Blood Culture (routine x 2)     Status: Abnormal (Preliminary result)   Collection Time: 08/06/2017  1:26 PM  Result Value Ref Range Status   Specimen Description   Final    RIGHT ANTECUBITAL Performed at [facility], [place] [address] [place], [place] [address]    Special Requests   Final    BOTTLES DRAWN AEROBIC AND ANAEROBIC Blood Culture adequate volume Performed at [facility] [address] [place], [place] [address]    Culture  Setup Time   Final    GRAM POSITIVE COCCI IN BOTH AEROBIC AND ANAEROBIC BOTTLES CRITICAL RESULT CALLED TO, READ BACK BY AND VERIFIED WITH: Chales Abrahams PharmD 11:10 07/17/17 ([email or web address])    Culture (A)  Final    STAPHYLOCOCCUS AUREUS SUSCEPTIBILITIES TO FOLLOW Performed at [facility], [place] [address] [place], [place] [address]    Report Status PENDING  Incomplete  Blood Culture (routine x 2)     Status: None (Preliminary result)   Collection Time: 07/13/2017  1:26 PM  Result Value Ref Range Status   Specimen Description   Final    LEFT ANTECUBITAL Performed at [facility] [address], [place], [place] [address]    Special Requests   Final    BOTTLES DRAWN AEROBIC AND ANAEROBIC Blood Culture results may not be optimal due to an excessive volume of blood received in culture bottles Performed at [facility] [address], [place], [place] [address]    Culture    Final    NO GROWTH 2 DAYS Performed at [facility] [address], [place], [place] [address]    Report Status PENDING  Incomplete  Urine culture     Status: None   Collection Time: 08/08/2017  1:26 PM  Result Value Ref Range Status  Specimen Description   Final    URINE, CLEAN CATCH Performed at [facility], [place] [address] [place], [place] [address]    Special Requests   Final    Normal Performed at [facility], [place] [address], [place], [place] [address]    Culture   Final    NO GROWTH Performed at [facility], [place] [address] [place], [place] [address]    Report Status 07/18/2017 FINAL  Final  Blood Culture ID Panel (Reflexed)     Status: Abnormal   Collection Time: 08/01/2017  1:26 PM  Result Value Ref Range Status   Enterococcus species NOT DETECTED NOT DETECTED Final   Listeria monocytogenes NOT DETECTED NOT DETECTED Final   Staphylococcus species DETECTED (A) NOT DETECTED Final    Comment: CRITICAL RESULT CALLED TO, READ BACK BY AND VERIFIED WITH: Chales Abrahams PharmD 11:10 07/17/17 ([email or web address])    Staphylococcus aureus DETECTED (A) NOT DETECTED Final    Comment: Methicillin (oxacillin)-resistant Staphylococcus aureus (MRSA). MRSA is predictably resistant to beta-lactam antibiotics (except ceftaroline). Preferred therapy is vancomycin unless clinically contraindicated. Patient requires contact precautions if  hospitalized. CRITICAL RESULT CALLED TO, READ BACK BY AND VERIFIED WITH: Chales Abrahams PharmD 11:10 07/17/17 ([email or web address])    Methicillin resistance DETECTED (A) NOT DETECTED Final    Comment: CRITICAL RESULT CALLED TO, READ BACK BY AND VERIFIED WITH: Chales Abrahams PharmD 11:10 07/17/17 ([email or web address])    Streptococcus species NOT DETECTED NOT DETECTED Final   Streptococcus agalactiae NOT DETECTED NOT DETECTED Final   Streptococcus pneumoniae NOT DETECTED NOT DETECTED Final   Streptococcus pyogenes NOT DETECTED NOT DETECTED Final   Acinetobacter  baumannii NOT DETECTED NOT DETECTED Final   Enterobacteriaceae species NOT DETECTED NOT DETECTED Final   Enterobacter cloacae complex NOT DETECTED NOT DETECTED Final   Escherichia coli NOT DETECTED NOT DETECTED Final   Klebsiella oxytoca NOT DETECTED NOT DETECTED Final   Klebsiella pneumoniae NOT DETECTED NOT DETECTED Final   Proteus species NOT DETECTED NOT DETECTED Final   Serratia marcescens NOT DETECTED NOT DETECTED Final   Haemophilus influenzae NOT DETECTED NOT DETECTED Final   Neisseria meningitidis NOT DETECTED NOT DETECTED Final   Pseudomonas aeruginosa NOT DETECTED NOT DETECTED Final   Candida albicans NOT DETECTED NOT DETECTED Final   Candida glabrata NOT DETECTED NOT DETECTED Final   Candida krusei NOT DETECTED NOT DETECTED Final   Candida parapsilosis NOT DETECTED NOT DETECTED Final   Candida tropicalis NOT DETECTED NOT DETECTED Final    Comment: Performed at [facility], [place]. [address], [place], [place] [address]  Culture, blood (routine x 2) Call MD if unable to obtain prior to antibiotics being given     Status: None (Preliminary result)   Collection Time: 07/17/2017  8:35 PM  Result Value Ref Range Status   Specimen Description   Final    BLOOD LEFT ANTECUBITAL Performed at [facility] [address] [place], [place] [address]    Special Requests   Final    BOTTLES DRAWN AEROBIC AND ANAEROBIC Blood Culture adequate volume Performed at [facility] [address], [place], [place] [address]    Culture   Final    NO GROWTH 2 DAYS Performed at [facility] [address] [place], [place] [address]    Report Status PENDING  Incomplete  Culture, blood (routine x 2) Call MD if unable to obtain prior to antibiotics being given     Status: None (  Preliminary result)   Collection Time: 07/28/2017  9:00 PM  Result Value Ref Range Status   Specimen Description   Final    BLOOD LEFT ARM Performed at [facility] [address], [place], [place] [address]    Special Requests   Final    BOTTLES DRAWN AEROBIC AND ANAEROBIC Blood Culture adequate volume Performed at [facility] [address] [place], [place] [address]    Culture   Final    NO GROWTH 2 DAYS Performed at [facility] [address] [place], [place] [address]    Report Status PENDING  Incomplete  Respiratory Panel by PCR     Status: None   Collection Time: 07/11/2017  9:42 PM  Result Value Ref Range Status   Adenovirus NOT DETECTED NOT DETECTED Final   Coronavirus 229E NOT DETECTED NOT DETECTED Final   Coronavirus HKU1 NOT DETECTED NOT DETECTED Final   Coronavirus NL63 NOT DETECTED NOT DETECTED Final   Coronavirus OC43 NOT DETECTED NOT DETECTED Final   Metapneumovirus NOT DETECTED NOT DETECTED Final   Rhinovirus / Enterovirus NOT DETECTED NOT DETECTED Final   Influenza A NOT DETECTED NOT DETECTED Final   Influenza B NOT DETECTED NOT DETECTED Final   Parainfluenza Virus 1 NOT DETECTED NOT DETECTED Final   Parainfluenza Virus 2 NOT DETECTED NOT DETECTED Final   Parainfluenza Virus 3 NOT DETECTED NOT DETECTED Final   Parainfluenza Virus 4 NOT DETECTED NOT DETECTED Final   Respiratory Syncytial Virus NOT DETECTED NOT DETECTED Final   Bordetella pertussis NOT DETECTED NOT DETECTED Final   Chlamydophila pneumoniae NOT DETECTED NOT DETECTED Final   Mycoplasma pneumoniae NOT DETECTED NOT DETECTED Final    Comment: Performed at [facility], [place] [address], [place], [place] [address]  MRSA PCR Screening     Status: None   Collection Time: 08/03/2017  9:43 PM  Result Value Ref Range Status   MRSA by PCR NEGATIVE NEGATIVE Final    Comment:        The GeneXpert MRSA Assay (FDA approved for NASAL specimens only), is one component of a comprehensive MRSA colonization surveillance program. It is not intended to diagnose MRSA infection nor to guide or monitor treatment for MRSA  infections. Performed at [facility], [place] [address], [place], [place] [address]   Culture, blood (Routine X 2) w Reflex to ID Panel     Status: None (Preliminary result)   Collection Time: 07/17/17 12:06 PM  Result Value Ref Range Status   Specimen Description   Final    BLOOD RIGHT HAND Performed at [facility] [address], [place], [place] [address]    Special Requests   Final    BOTTLES DRAWN AEROBIC AND ANAEROBIC Blood Culture adequate volume Performed at [facility] [address], [place], [place] [address]    Culture   Final    NO GROWTH < 24 HOURS Performed at [facility] [address] [place], [place] [address]    Report Status PENDING  Incomplete  Culture, blood (Routine X 2) w Reflex to ID Panel     Status: None (Preliminary result)   Collection Time: 07/17/17 12:07 PM  Result Value Ref Range Status   Specimen Description   Final    BLOOD LEFT HAND Performed at [facility] [address], [place],  [address]    Special Requests   Final  BOTTLES DRAWN AEROBIC ONLY Blood Culture adequate volume Performed at [facility] [address], [place], [place] [address]    Culture   Final    NO GROWTH < 24 HOURS Performed at [facility] [address], [place], [place] [address]    Report Status PENDING  Incomplete    Studies/Results: Mr Brain Wo Contrast  Result Date: 07/17/2017 CLINICAL DATA:  Worsening mental status. 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 EXAM: MRI HEAD WITHOUT CONTRAST TECHNIQUE: Multiplanar, multiecho pulse sequences of the brain and surrounding structures were obtained without intravenous contrast. COMPARISON:  Head CT 08/08/2017.  MRI 05/12/2014. FINDINGS: Brain: Diffusion imaging does not show any acute or subacute infarction. There has been previous left mastoidectomy which appears the same as on  previous exams. No evidence of any abnormality of the brainstem, cerebellum or cerebral hemispheres. No old infarction, mass lesion, hemorrhage, hydrocephalus or extra-axial collection. Vascular: Major vessels at the base of the brain show flow. Skull and upper cervical spine: Otherwise negative. Sinuses/Orbits: Clear/normal Other: None IMPRESSION: No acute or reversible finding. No abnormality seen to explain worsening mental status. Distant left mastoidectomy without complicating feature. Electronically Signed   By: Nelson Chimes M.D.   On: 07/17/2017 07:01   Mr Thoracic Spine Wo Contrast  Result Date: 07/17/2017 CLINICAL DATA:  Altered mental status. Sepsis with elevated white blood cell count. Possible meningitis. EXAM: MRI THORACIC AND LUMBAR SPINE WITHOUT CONTRAST TECHNIQUE: Multiplanar and multiecho pulse sequences of the thoracic and lumbar spine were obtained without intravenous contrast. COMPARISON:  CT abdomen and pelvis 07/11/2017. FINDINGS: MRI THORACIC SPINE FINDINGS Alignment: There is exaggeration of the normal thoracic kyphosis. No listhesis. Vertebrae: Vertebral body height and signal are maintained. No evidence of discitis or osteomyelitis. No fracture or worrisome lesion. Cord:  Normal signal throughout. Paraspinal and other soft tissues: A 0.8 cm nodular opacity is seen posteriorly in the right lung on image 12 of series 8. Small bilateral pleural effusions are identified. The patient also has a small hiatal hernia. Disc levels: The central spinal canal and neural foramina are patent at all levels. Small central disc protrusion at T7-8 is noted. MRI LUMBAR SPINE FINDINGS Segmentation:  Standard. Alignment: Trace retrolisthesis L2 on L3 is identified. There is convex left scoliosis. Vertebrae: No fracture or worrisome lesion. No evidence of infectious process. Conus medullaris and cauda equina: Conus extends to the L1-2 level. Conus and cauda equina appear normal. Paraspinal and other soft  tissues: Negative. Disc levels: L1-2: Minimal disc bulge without stenosis. L2-3: Shallow disc bulge is identified. Loss of disc space height is noted. The central canal is mildly narrowed. The foramina are open. L3-4: There is loss of disc space height, a shallow disc bulge and vacuum disc phenomenon. The central canal and foramina remain open. L4-5: Bilateral facet degenerative change with associated small effusions. Shallow disc bulge. The central canal and foramina are open. L5-S1: Shallow disc bulge without stenosis. IMPRESSION: MR THORACIC SPINE IMPRESSION No acute abnormality.  Negative for source of infection. 0.7 cm nodular opacity in the right lung cannot be definitively characterized. Recommend CT chest in 6 months to ensure stability. MR LUMBAR SPINE IMPRESSION No acute abnormality.  Negative for source of infection. Mild central canal narrowing L2-3 due to a shallow disc bulge. Scattered facet arthropathy appearing worst at L4-5. Electronically Signed   By: Inge Rise M.D.   On: 07/17/2017 08:06   Mr Lumbar Spine Wo Contrast  Result Date: 07/17/2017 CLINICAL  DATA:  Altered mental status. Sepsis with elevated white blood cell count. Possible meningitis. EXAM: MRI THORACIC AND LUMBAR SPINE WITHOUT CONTRAST TECHNIQUE: Multiplanar and multiecho pulse sequences of the thoracic and lumbar spine were obtained without intravenous contrast. COMPARISON:  CT abdomen and pelvis 07/17/2017. FINDINGS: MRI THORACIC SPINE FINDINGS Alignment: There is exaggeration of the normal thoracic kyphosis. No listhesis. Vertebrae: Vertebral body height and signal are maintained. No evidence of discitis or osteomyelitis. No fracture or worrisome lesion. Cord:  Normal signal throughout. Paraspinal and other soft tissues: A 0.8 cm nodular opacity is seen posteriorly in the right lung on image 12 of series 8. Small bilateral pleural effusions are identified. The patient also has a small hiatal hernia. Disc levels: The central  spinal canal and neural foramina are patent at all levels. Small central disc protrusion at T7-8 is noted. MRI LUMBAR SPINE FINDINGS Segmentation:  Standard. Alignment: Trace retrolisthesis L2 on L3 is identified. There is convex left scoliosis. Vertebrae: No fracture or worrisome lesion. No evidence of infectious process. Conus medullaris and cauda equina: Conus extends to the L1-2 level. Conus and cauda equina appear normal. Paraspinal and other soft tissues: Negative. Disc levels: L1-2: Minimal disc bulge without stenosis. L2-3: Shallow disc bulge is identified. Loss of disc space height is noted. The central canal is mildly narrowed. The foramina are open. L3-4: There is loss of disc space height, a shallow disc bulge and vacuum disc phenomenon. The central canal and foramina remain open. L4-5: Bilateral facet degenerative change with associated small effusions. Shallow disc bulge. The central canal and foramina are open. L5-S1: Shallow disc bulge without stenosis. IMPRESSION: MR THORACIC SPINE IMPRESSION No acute abnormality.  Negative for source of infection. 0.7 cm nodular opacity in the right lung cannot be definitively characterized. Recommend CT chest in 6 months to ensure stability. MR LUMBAR SPINE IMPRESSION No acute abnormality.  Negative for source of infection. Mild central canal narrowing L2-3 due to a shallow disc bulge. Scattered facet arthropathy appearing worst at L4-5. Electronically Signed   By: Inge Rise M.D.   On: 07/17/2017 08:06   Mr Ankle Right Wo Contrast  Result Date: 07/18/2017 CLINICAL DATA:  Blood cultures positive for MRSA. Pain and swelling of both ankles. EXAM: MRI OF THE RIGHT ANKLE WITHOUT CONTRAST TECHNIQUE: Multiplanar, multisequence MR imaging of the ankle was performed. No intravenous contrast was administered. COMPARISON:  None. FINDINGS: TENDONS Peroneal: Intact. Posteromedial: Intact. Anterior: Intact. Achilles: The distal tendon is somewhat thickened with  calcifications within it consistent with chronic tendinosis. Mild edema in a calcaneal spur at the tendon insertion is noted. Plantar Fascia: Intact.  Very mild edema is seen in the medial cord. LIGAMENTS Lateral: Intact. Medial: Intact. CARTILAGE Ankle Joint: Normal. Subtalar Joints/Sinus Tarsi: Normal. Bones: No marrow signal abnormality to suggest osteomyelitis. No fracture or focal lesion. Other: Subcutaneous edema is present about the ankle. IMPRESSION: Subcutaneous edema about the ankle could be due to dependent change and/or cellulitis. Negative for abscess, septic joint or osteomyelitis. Chronic Achilles tendinopathy. Mild plantar fasciitis of the medial cord. Electronically Signed   By: Inge Rise M.D.   On: 07/18/2017 12:16   Mr Ankle Left Wo Contrast  Result Date: 07/18/2017 CLINICAL DATA:  Blood cultures positive for MRSA. Pain and swelling of both ankles. EXAM: MRI OF THE LEFT ANKLE WITHOUT CONTRAST TECHNIQUE: Multiplanar, multisequence MR imaging of the ankle was performed. No intravenous contrast was administered. COMPARISON:  None. FINDINGS: TENDONS Peroneal: Intact. Posteromedial: Intact. Anterior: Intact. Achilles: Intact. The  distal tendon is thickened with calcifications within it. Mild marrow edema is seen in a calcaneal spur at the Achilles tendon insertion. Plantar Fascia: Unremarkable. LIGAMENTS Lateral: Intact. Medial: Intact. CARTILAGE Ankle Joint: Normal. Subtalar Joints/Sinus Tarsi: Unremarkable. Bones: Normal marrow signal throughout. No evidence of osteomyelitis. Other: Subcutaneous edema is present about the ankle. No focal fluid collection. IMPRESSION: Subcutaneous edema about the ankle could be due to dependent change or cellulitis. Negative for osteomyelitis, abscess or septic joint. Chronic Achilles tendinopathy. Electronically Signed   By: Inge Rise M.D.   On: 07/18/2017 12:38      Assessment/Plan:  INTERVAL HISTORY: pts MRI of ankles without  pathology   Principal Problem:   ARF (acute renal failure) (HCC) Active Problems:   Atrial fibrillation with RVR (HCC)   Severe sepsis with septic shock (HCC)   UTI (urinary tract infection)   CAP (community acquired pneumonia)   AKI (acute kidney injury) ([facility])   Lactic acidosis   Lumbar back pain   MRSA bacteremia    Theresa Morrow is a 62 y.o. female with  MRSA bacteremia and septic shock. Her source is unclear  #1      [facility] Antimicrobial Management Team Staphylococcus aureus bacteremia   Staphylococcus aureus bacteremia (SAB) is associated with a high rate of complications and mortality.  Specific aspects of clinical management are critical to optimizing the outcome of patients with SAB.  Therefore, the [facility] Antimicrobial Management Team [facility] has initiated an intervention aimed at improving the management of SAB at [facility].  To do so, Infectious Diseases physicians are providing an evidence-based consult for the management of all patients with SAB.     Yes No Comments  Perform follow-up blood cultures (even if the patient is afebrile) to ensure clearance of bacteremia [x]  []    Remove vascular catheter and obtain follow-up blood cultures after the removal of the catheter [x]  []  DO NOT PLACE CENTRAL INE UNTIL WE HAVE NO GROWTH AT 5 days  Perform echocardiography to evaluate for endocarditis (transthoracic ECHO is 40-50% sensitive, TEE is > 90% sensitive) [x]  []  Please keep in mind, that neither test can definitively EXCLUDE endocarditis, and that should clinical suspicion remain high for endocarditis the patient should then still be treated with an "endocarditis" duration of therapy = 6 weeks TEE tomorrow  Consult electrophysiologist to evaluate implanted cardiac device (pacemaker, ICD) []  []    Ensure source control [x]  []  Have all abscesses been drained effectively? Have deep seeded infections (septic joints or osteomyelitis) had appropriate surgical debridement?   Investigate for "metastatic" sites of infection [x]  []  Does the patient have ANY symptom or physical exam finding that would suggest a deeper infection (back or neck pain that may be suggestive of vertebral osteomyelitis or epidural abscess, muscle pain that could be a symptom of pyomyositis)?  Keep in mind that for deep seeded infections MRI imaging with contrast is preferred rather than other often insensitive tests such as plain x-rays, especially early in a patient's presentation.  Change antibiotic therapy to vancomycin []  []  Beta-lactam antibiotics are preferred for MSSA due to higher cure rates.   If on Vancomycin, goal trough should be 15 - 20 mcg/mL  Estimated duration of IV antibiotic therapy:  4-6 weeks []  []  Consult case management for probably prolonged outpatient IV antibiotic therapy      LOS: 2 days   Alcide Evener 07/18/2017, 6:10 PM

## 2017-07-18 NOTE — Progress Notes (Signed)
Pharmacy Antibiotic Note  ZENIAH BRINEY is a 62 y.o. female admitted on 08/02/2017 with c/o ongoing, severe back pain, increased confusion. Hx of PNA several wks ago.  Pharmacy has been consulted for Vancomycin & Cefepime dosing.   CXray clear, no acute process. CT: non-obstructing renal stones, mild pulm lingular infiltrate.  Today, 07/18/2017: SCr improved to 1.72, CrCl ~ 37 ml/min WBC 25.2 Afebrile New positive MRSA blood culture - ID now following  Plan: Adjust to Vancomycin to 1250 mg IV q36h for estimated AUC 469 Check vancomycin levels at steady state.  Goal AUC 400-500. Follow up renal fxn, culture results, and clinical course. Follow ID recommendations   Height: 5\' 7"  (170.2 cm) Weight: 182 lb (82.6 kg) IBW/kg (Calculated) : 61.6  Temp (24hrs), Avg:98 F (36.7 C), Min:97.2 F (36.2 C), Max:98.8 F (37.1 C)  Recent Labs  Lab 07/14/17 1940 07/27/2017 1238 08/06/2017 1330 08/07/2017 1605 07/29/2017 2032 07/11/2017 2248 07/17/17 0443 07/18/17 0311  WBC 2.2* 15.3*  --   --   --  23.7* 25.2*  --   CREATININE 1.40* 2.56*  --   --  2.02* 1.83* 1.72* 1.36*  LATICACIDVEN  --   --  2.60* 2.06*  --  1.5  --   --     Estimated Creatinine Clearance: 47.4 mL/min (A) (by C-G formula based on SCr of 1.36 mg/dL (H)).    No Known Allergies  Antimicrobials this admission:  5/6 Vanc >>  5/6 Cefepime >> 5/7  Dose adjustments this admission:  5/7 Vancomycin dose adjusted to 1g q36h for improving renal function. 5/7 Vancomycin adjusted to 1250mg  q36h for improving renal function.  Microbiology results:  5/6 BCx: GPC        5/7 BCID:  Staph aureus, methicillin resistant 5/6 UCx: sent 5/6 Strep pneumo: positive 5/6 Legionella: ordered 5/6 Flu panel PCR: negative 5/6 Respiratory panel PCR: collected 5/6 MRSA PCR: negative 5/7 BCx: sent  Thank you for allowing pharmacy to be a part of this patient's care.  Peggyann Juba, PharmD, BCPS Pager: [phone] 07/18/2017 7:27 AM

## 2017-07-19 ENCOUNTER — Inpatient Hospital Stay (HOSPITAL_COMMUNITY): Payer: BLUE CROSS/BLUE SHIELD

## 2017-07-19 ENCOUNTER — Encounter (HOSPITAL_COMMUNITY): Admission: EM | Disposition: E | Payer: Self-pay | Source: Home / Self Care | Attending: Family Medicine

## 2017-07-19 ENCOUNTER — Ambulatory Visit (HOSPITAL_COMMUNITY): Admit: 2017-07-19 | Payer: BLUE CROSS/BLUE SHIELD | Admitting: Cardiology

## 2017-07-19 DIAGNOSIS — R8271 Bacteriuria: Secondary | ICD-10-CM

## 2017-07-19 DIAGNOSIS — I351 Nonrheumatic aortic (valve) insufficiency: Secondary | ICD-10-CM

## 2017-07-19 DIAGNOSIS — A419 Sepsis, unspecified organism: Secondary | ICD-10-CM

## 2017-07-19 DIAGNOSIS — B9562 Methicillin resistant Staphylococcus aureus infection as the cause of diseases classified elsewhere: Secondary | ICD-10-CM

## 2017-07-19 DIAGNOSIS — I4891 Unspecified atrial fibrillation: Secondary | ICD-10-CM

## 2017-07-19 DIAGNOSIS — R7881 Bacteremia: Secondary | ICD-10-CM

## 2017-07-19 DIAGNOSIS — N179 Acute kidney failure, unspecified: Secondary | ICD-10-CM

## 2017-07-19 DIAGNOSIS — R509 Fever, unspecified: Secondary | ICD-10-CM

## 2017-07-19 LAB — CBC
HCT: 30.5 % — ABNORMAL LOW (ref 36.0–46.0)
Hemoglobin: 10.1 g/dL — ABNORMAL LOW (ref 12.0–15.0)
MCH: 28.1 pg (ref 26.0–34.0)
MCHC: 33.1 g/dL (ref 30.0–36.0)
MCV: 84.7 fL (ref 78.0–100.0)
PLATELETS: 156 10*3/uL (ref 150–400)
RBC: 3.6 MIL/uL — ABNORMAL LOW (ref 3.87–5.11)
RDW: 16.4 % — AB (ref 11.5–15.5)
WBC: 14.8 10*3/uL — AB (ref 4.0–10.5)

## 2017-07-19 LAB — CREATININE, SERUM
CREATININE: 0.82 mg/dL (ref 0.44–1.00)
GFR calc Af Amer: >60 mL/min (ref >60)

## 2017-07-19 LAB — CULTURE, BLOOD (ROUTINE X 2): Special Requests: ADEQUATE

## 2017-07-19 LAB — HEPARIN LEVEL (UNFRACTIONATED)
HEPARIN UNFRACTIONATED: 0.43 [IU]/mL (ref 0.30–0.70)
HEPARIN UNFRACTIONATED: 0.45 [IU]/mL (ref 0.30–0.70)

## 2017-07-19 SURGERY — ECHOCARDIOGRAM, TRANSESOPHAGEAL
Anesthesia: Monitor Anesthesia Care

## 2017-07-19 MED ORDER — SODIUM CHLORIDE 0.9 % IV SOLN
INTRAVENOUS | Status: DC
  Administered 2017-07-19: 14:00:00 via INTRAVENOUS

## 2017-07-19 MED ORDER — SODIUM CHLORIDE 0.9 % IV SOLN
INTRAVENOUS | Status: DC
  Administered 2017-07-19: 04:00:00 via INTRAVENOUS

## 2017-07-19 MED ORDER — MIDAZOLAM HCL 2 MG/2ML IJ SOLN
INTRAMUSCULAR | Status: AC
  Administered 2017-07-19: 3 mg
  Filled 2017-07-19: qty 10

## 2017-07-19 MED ORDER — FENTANYL CITRATE (PF) 100 MCG/2ML IJ SOLN
INTRAMUSCULAR | Status: AC
  Administered 2017-07-19: 125 ug
  Filled 2017-07-19: qty 6

## 2017-07-19 MED ORDER — VANCOMYCIN HCL IN DEXTROSE 750-5 MG/150ML-% IV SOLN
750.0000 mg | Freq: Two times a day (BID) | INTRAVENOUS | Status: DC
  Administered 2017-07-19 – 2017-07-21 (×4): 750 mg via INTRAVENOUS
  Filled 2017-07-19 (×5): qty 150

## 2017-07-19 NOTE — Progress Notes (Signed)
Patient ID: Theresa Morrow, female   DOB: Feb 24, 1956, 62 y.o.   MRN: [id number]                                                                PROGRESS NOTE                                                                                                                                                                                                             Patient Demographics:    Theresa Morrow, is a 62 y.o. female, DOB - Oct 20, 1955, IHK:[id number]  Admit date - 08/04/2017   Admitting Physician Jani Gravel, MD  Outpatient Primary MD for the patient is [place], [place] - 3  Outpatient Specialists:     Chief Complaint  Patient presents with  . Hypotension  . Altered Mental Status  . Hallucinations       Brief Narrative  62 y.o. female, w brain tumor in 2007, chronic headache, gerd,  c/o lower back without radiation.  Denies numbness, tingling, weakness, incontinence.  Started Tuesday ( 4/30.2019) while at work, no hx of trauma.  Pt couldn't walk more than 100 feet. Presented to ED on 5/1 and had xray.  5/4 had CT scan L spine.  Pt was told to go to ED due to low bp and altered mental status. Glucose was low initially at 32    In Ed,  CT scan abd/ pelvis IMPRESSION: Nonobstructing left renal stones. Postoperative changes as described. Mild lingular infiltrate.  CT brain  IMPRESSION: 1. No acute intracranial abnormality. 2. 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  Na 138, 4.0 Bun 63, Creatinine 2.56 Ast 72, Alt 39, Alk phos 161 T. Bili 1.2  Wbc 15.3, hgb 13.8, Plt 122 Esr 32  Blood culture x2 pending Urinalysis  Wbc 6-10, rbc 21-50,  Urine culture pending  Lactic acid 2.60 Original ekg showed nsr,  At 5 pm, pt tachycardic and asymptomatic EKG showed Afib with RVR at 180   Pt will be admitted for sepsis secondary to ? Lingular infiltrate vs uti and Afib with RVR.      Subjective:    Theresa Morrow has no new complaint reported.   Assessment  & Plan :    Principal Problem:  ARF (acute renal failure) (HCC) Active Problems:   Atrial fibrillation with RVR (HCC)   Sepsis (HCC)   UTI (urinary tract infection)   CAP (community acquired pneumonia)   AKI (acute kidney injury) ([place])   Lactic acidosis   Lumbar back pain   MRSA bacteremia     Sepsis (tachy, hypotension, elevation in LA) ddx HCAP vs UTI - Pt with S aureus bacteremia. ID on board and assisting with work up. Antibiotics per ID. I have contacted cardiology for TEE to assess for source as well as orthopaedic surgery for consideration of aspiration of ankle joints. Although after evaluation of MRI of ankles ortho does not think aspiration may be required per our conversation. - continue current antibiotic regimen.   Hypotension improved with Fluids - troponin with flat trend  Afib with RVR - Cardiology on board currently and managing. Pt on Cardizem - anticoagulated on Heparin   ARF - improved with rehydration, as such suspecting prerenal etiology  Hypoglycemia - resolved as such will d/c blood glucose checks.  Back pain - MRI L spine  Gerd - Cont PPI   Code Status : FULL CODE  Family Communication  : w patient  Disposition Plan  : home  Barriers For Discharge : afib with rvr requiring IV medication  Consults  :  cardiology  Procedures  : TEE pending  DVT Prophylaxis  :  Pt on heparin  Lab Results  Component Value Date   PLT 156 07/29/2017    Antibiotics  :  Vancomycin  Anti-infectives (From admission, onward)   Start     Dose/Rate Route Frequency Ordered Stop   07/18/17 2000  vancomycin (VANCOCIN) IVPB 1000 mg/200 mL premix  Status:  Discontinued     1,000 mg 200 mL/hr over 60 Minutes Intravenous Every 48 hours 08/06/2017 1931 07/17/17 1154   07/18/17 1200  vancomycin (VANCOCIN) 1,250 mg in sodium chloride 0.9 % 250 mL IVPB     1,250 mg 166.7 mL/hr over 90 Minutes  Intravenous Every 36 hours 07/18/17 0728     07/17/17 1400  ceFEPIme (MAXIPIME) 1 g in sodium chloride 0.9 % 100 mL IVPB  Status:  Discontinued     1 g 200 mL/hr over 30 Minutes Intravenous Every 24 hours 07/11/2017 1931 07/17/17 0901   07/17/17 1200  vancomycin (VANCOCIN) IVPB 1000 mg/200 mL premix  Status:  Discontinued     1,000 mg 200 mL/hr over 60 Minutes Intravenous Every 36 hours 07/17/17 1154 07/18/17 0728   07/17/17 1000  ceFEPIme (MAXIPIME) 1 g in sodium chloride 0.9 % 100 mL IVPB  Status:  Discontinued     1 g 200 mL/hr over 30 Minutes Intravenous Every 12 hours 07/17/17 0901 07/17/17 1149   08/06/2017 2000  vancomycin (VANCOCIN) 500 mg in sodium chloride 0.9 % 100 mL IVPB     500 mg 100 mL/hr over 60 Minutes Intravenous  Once 08/01/2017 1931 07/26/2017 2220   08/03/2017 1900  cefTRIAXone (ROCEPHIN) 1 g in sodium chloride 0.9 % 100 mL IVPB  Status:  Discontinued     1 g 200 mL/hr over 30 Minutes Intravenous Every 24 hours 07/20/2017 1849 08/09/2017 1901   07/25/2017 1900  azithromycin (ZITHROMAX) 500 mg in sodium chloride 0.9 % 250 mL IVPB  Status:  Discontinued     500 mg 250 mL/hr over 60 Minutes Intravenous Every 24 hours 07/14/2017 1849 07/14/2017 1901   08/04/2017 1300  piperacillin-tazobactam (ZOSYN) IVPB 3.375 g  Status:  Discontinued     3.375  g 100 mL/hr over 30 Minutes Intravenous  Once 07/22/2017 1245 08/02/2017 1257   07/29/2017 1300  vancomycin (VANCOCIN) IVPB 1000 mg/200 mL premix     1,000 mg 200 mL/hr over 60 Minutes Intravenous  Once 07/17/2017 1245 07/18/2017 1424   08/03/2017 1300  ceFEPIme (MAXIPIME) 2 g in sodium chloride 0.9 % 100 mL IVPB     2 g 200 mL/hr over 30 Minutes Intravenous  Once 08/04/2017 1300 07/18/2017 1346        Objective:   Vitals:   07/20/2017 0500 07/22/2017 0551 07/24/2017 0600 07/14/2017 0800  BP:   112/77 134/76  Pulse: (!) 126  (!) 45 (!) 111  Resp: 13  (!) 23 17  Temp:  98.1 F (36.7 C)  99.1 F (37.3 C)  TempSrc:  Oral  Axillary  SpO2: 93%  97% 96%  Weight:        Height:        Wt Readings from Last 3 Encounters:  07/20/2017 82.6 kg (182 lb)  05/18/16 86.4 kg (190 lb 6 oz)  12/23/15 83.5 kg (184 lb)     Intake/Output Summary (Last 24 hours) at 07/25/2017 1014 Last data filed at 08/10/2017 0800 Gross per 24 hour  Intake 681.55 ml  Output 1275 ml  Net -593.45 ml     Physical Exam  GEN: Awake Alert, Oriented X 3, No new F.N deficits, Normal affect Ramos.AT,PERRAL Supple Neck,No JVD, No cervical lymphadenopathy appriciated.  Pulm: equal chest rise, Good air movement bilaterally, CTAB Irr, irr, s1, s2,  Abdomen: + B.Sounds, Abd Soft, No tenderness, No organomegaly appriciated, No rebound - guarding or rigidity. SKIN: No Cyanosis, Clubbing or edema, No new Rash or bruise   No janeway, no osler     Data Review:    CBC Recent Labs  Lab 07/14/17 1940 08/09/2017 1238 07/15/2017 2248 07/17/17 0443 07/18/17 0311 07/17/2017 0418  WBC 2.2* 15.3* 23.7* 25.2* 28.4* 14.8*  HGB 12.6 13.8 11.6* 11.0* 10.8* 10.1*  HCT 38.9 42.2 35.3* 33.7* 32.6* 30.5*  PLT 119* 122* 129* 122* 144* 156  MCV 88.2 87.0 84.7 84.7 84.7 84.7  MCH 28.6 28.5 27.8 27.6 28.1 28.1  MCHC 32.4 32.7 32.9 32.6 33.1 33.1  RDW 15.8* 16.5* 16.4* 16.3* 16.7* 16.4*  LYMPHSABS 0.5*  --  1.4  --   --   --   MONOABS 0.5  --  0.5  --   --   --   EOSABS 0.0  --  0.0  --   --   --   BASOSABS 0.0  --  0.0  --   --   --     Chemistries  Recent Labs  Lab 07/14/17 1940 08/03/2017 1238 08/06/2017 2032 07/27/2017 2248 07/17/17 0443 07/18/17 0311  NA 141 138  --  138  --  138  K 3.7 4.0  --  3.8  --  4.3  CL 109 104  --  111  --  111  CO2 21* 18*  --  17*  --  17*  GLUCOSE 94 72  --  148*  --  129*  BUN 40* 63*  --  56*  --  53*  CREATININE 1.40* 2.56* 2.02* 1.83* 1.72* 1.36*  CALCIUM 8.5* 8.5*  --  7.7*  --  7.8*  MG  --   --   --  1.9  --   --   AST  --  72*  --  49*  --  30  ALT  --  39  --  32  --  26  ALKPHOS  --  161*  --  129*  --  153*  BILITOT  --  1.2  --  1.1  --  0.8    ------------------------------------------------------------------------------------------------------------------ No results for input(s): CHOL, HDL, LDLCALC, TRIG, CHOLHDL, LDLDIRECT in the last 72 hours.  No results found for: HGBA1C ------------------------------------------------------------------------------------------------------------------ Recent Labs    07/18/2017 1733  TSH 0.465   ------------------------------------------------------------------------------------------------------------------ No results for input(s): VITAMINB12, FOLATE, FERRITIN, TIBC, IRON, RETICCTPCT in the last 72 hours.  Coagulation profile Recent Labs  Lab 08/05/2017 2248  INR 1.13    Recent Labs    08/01/2017 1748  DDIMER 7.82*    Cardiac Enzymes Recent Labs  Lab 08/10/2017 1733 07/17/2017 2032 08/04/2017 2248 07/17/17 0443  CKMB  --  10.4*  --   --   TROPONINI <0.03  --  0.03* 0.03*   ------------------------------------------------------------------------------------------------------------------ No results found for: BNP  Inpatient Medications  Scheduled Meds: . fentaNYL      . midazolam      . aspirin  325 mg Oral Daily  . mouth rinse  15 mL Mouth Rinse BID  . methocarbamol  500 mg Oral BID  . pantoprazole  40 mg Oral Daily   Continuous Infusions: . sodium chloride    . sodium chloride 20 mL/hr at 07/28/2017 0345  . diltiazem (CARDIZEM) infusion 10 mg/hr (07/12/2017 0800)  . heparin 1,950 Units/hr (07/18/2017 0800)  . vancomycin Stopped (07/18/17 1505)   PRN Meds:.acetaminophen, morphine injection, morphine injection  Micro Results Recent Results (from the past 240 hour(s))  Blood Culture (routine x 2)     Status: Abnormal   Collection Time: 07/14/2017  1:26 PM  Result Value Ref Range Status   Specimen Description   Final    RIGHT ANTECUBITAL Performed at [facility] [address], [place], [place] [address]    Special Requests   Final    BOTTLES DRAWN  AEROBIC AND ANAEROBIC Blood Culture adequate volume Performed at [facility] [address] [place], [place] [address]    Culture  Setup Time   Final    GRAM POSITIVE COCCI IN BOTH AEROBIC AND ANAEROBIC BOTTLES CRITICAL RESULT CALLED TO, READ BACK BY AND VERIFIED WITH: Chales Abrahams PharmD 11:10 07/17/17 ([email or web address]) Performed at [facility], [place] [address] [place], [place] [address]    Culture METHICILLIN RESISTANT STAPHYLOCOCCUS AUREUS (A)  Final   Report Status 07/27/2017 FINAL  Final   Organism ID, Bacteria METHICILLIN RESISTANT STAPHYLOCOCCUS AUREUS  Final      Susceptibility   Methicillin resistant staphylococcus aureus - MIC*    CIPROFLOXACIN >=8 RESISTANT Resistant     ERYTHROMYCIN >=8 RESISTANT Resistant     GENTAMICIN <=0.5 SENSITIVE Sensitive     OXACILLIN >=4 RESISTANT Resistant     TETRACYCLINE <=1 SENSITIVE Sensitive     VANCOMYCIN 1 SENSITIVE Sensitive     TRIMETH/SULFA <=10 SENSITIVE Sensitive     CLINDAMYCIN <=0.25 SENSITIVE Sensitive     RIFAMPIN <=0.5 SENSITIVE Sensitive     Inducible Clindamycin NEGATIVE Sensitive     * METHICILLIN RESISTANT STAPHYLOCOCCUS AUREUS  Blood Culture (routine x 2)     Status: None (Preliminary result)   Collection Time: 07/11/2017  1:26 PM  Result Value Ref Range Status   Specimen Description   Final    LEFT ANTECUBITAL Performed at [facility] [address] [place], [place] [address]    Special Requests  Final    BOTTLES DRAWN AEROBIC AND ANAEROBIC Blood Culture results may not be optimal due to an excessive volume of blood received in culture bottles Performed at [facility] [address], [place], [place] [address]    Culture   Final    NO GROWTH 2 DAYS Performed at [facility] [address] [place], [place] [address]    Report Status PENDING  Incomplete  Urine culture     Status: None   Collection Time: 07/15/2017  1:26 PM  Result Value Ref Range Status    Specimen Description   Final    URINE, CLEAN CATCH Performed at [facility], [place] [address], [place], [place] [address]    Special Requests   Final    Normal Performed at [facility], [place] [address] [place], [place] [address]    Culture   Final    NO GROWTH Performed at [facility], [place] [address], [place], [place] [address]    Report Status 07/18/2017 FINAL  Final  Blood Culture ID Panel (Reflexed)     Status: Abnormal   Collection Time: 07/31/2017  1:26 PM  Result Value Ref Range Status   Enterococcus species NOT DETECTED NOT DETECTED Final   Listeria monocytogenes NOT DETECTED NOT DETECTED Final   Staphylococcus species DETECTED (A) NOT DETECTED Final    Comment: CRITICAL RESULT CALLED TO, READ BACK BY AND VERIFIED WITH: Chales Abrahams PharmD 11:10 07/17/17 ([email or web address])    Staphylococcus aureus DETECTED (A) NOT DETECTED Final    Comment: Methicillin (oxacillin)-resistant Staphylococcus aureus (MRSA). MRSA is predictably resistant to beta-lactam antibiotics (except ceftaroline). Preferred therapy is vancomycin unless clinically contraindicated. Patient requires contact precautions if  hospitalized. CRITICAL RESULT CALLED TO, READ BACK BY AND VERIFIED WITH: Chales Abrahams PharmD 11:10 07/17/17 ([email or web address])    Methicillin resistance DETECTED (A) NOT DETECTED Final    Comment: CRITICAL RESULT CALLED TO, READ BACK BY AND VERIFIED WITH: Chales Abrahams PharmD 11:10 07/17/17 ([email or web address])    Streptococcus species NOT DETECTED NOT DETECTED Final   Streptococcus agalactiae NOT DETECTED NOT DETECTED Final   Streptococcus pneumoniae NOT DETECTED NOT DETECTED Final   Streptococcus pyogenes NOT DETECTED NOT DETECTED Final   Acinetobacter baumannii NOT DETECTED NOT DETECTED Final   Enterobacteriaceae species NOT DETECTED NOT DETECTED Final   Enterobacter cloacae complex NOT DETECTED NOT DETECTED Final   Escherichia coli NOT DETECTED NOT DETECTED Final   Klebsiella oxytoca NOT  DETECTED NOT DETECTED Final   Klebsiella pneumoniae NOT DETECTED NOT DETECTED Final   Proteus species NOT DETECTED NOT DETECTED Final   Serratia marcescens NOT DETECTED NOT DETECTED Final   Haemophilus influenzae NOT DETECTED NOT DETECTED Final   Neisseria meningitidis NOT DETECTED NOT DETECTED Final   Pseudomonas aeruginosa NOT DETECTED NOT DETECTED Final   Candida albicans NOT DETECTED NOT DETECTED Final   Candida glabrata NOT DETECTED NOT DETECTED Final   Candida krusei NOT DETECTED NOT DETECTED Final   Candida parapsilosis NOT DETECTED NOT DETECTED Final   Candida tropicalis NOT DETECTED NOT DETECTED Final    Comment: Performed at [facility], [place]. [address], [place], [place] [address]  Culture, blood (routine x 2) Call MD if unable to obtain prior to antibiotics being given     Status: None (Preliminary result)   Collection Time: 07/27/2017  8:35 PM  Result Value Ref Range Status   Specimen Description   Final    BLOOD LEFT ANTECUBITAL Performed at [facility]  [place] [address] [place], [place] [address]    Special Requests   Final    BOTTLES DRAWN AEROBIC AND ANAEROBIC Blood Culture adequate volume Performed at [facility] [address] [place], [place] [address]    Culture   Final    NO GROWTH 2 DAYS Performed at [facility] [address], [place], [place] [address]    Report Status PENDING  Incomplete  Culture, blood (routine x 2) Call MD if unable to obtain prior to antibiotics being given     Status: None (Preliminary result)   Collection Time: 07/20/2017  9:00 PM  Result Value Ref Range Status   Specimen Description   Final    BLOOD LEFT ARM Performed at [facility] [address] [place], [place] [address]    Special Requests   Final    BOTTLES DRAWN AEROBIC AND ANAEROBIC Blood Culture adequate volume Performed at [facility] [address], [place], [place] [address]      Culture   Final    NO GROWTH 2 DAYS Performed at [facility] [address], [place], [place] [address]    Report Status PENDING  Incomplete  Respiratory Panel by PCR     Status: None   Collection Time: 07/14/2017  9:42 PM  Result Value Ref Range Status   Adenovirus NOT DETECTED NOT DETECTED Final   Coronavirus 229E NOT DETECTED NOT DETECTED Final   Coronavirus HKU1 NOT DETECTED NOT DETECTED Final   Coronavirus NL63 NOT DETECTED NOT DETECTED Final   Coronavirus OC43 NOT DETECTED NOT DETECTED Final   Metapneumovirus NOT DETECTED NOT DETECTED Final   Rhinovirus / Enterovirus NOT DETECTED NOT DETECTED Final   Influenza A NOT DETECTED NOT DETECTED Final   Influenza B NOT DETECTED NOT DETECTED Final   Parainfluenza Virus 1 NOT DETECTED NOT DETECTED Final   Parainfluenza Virus 2 NOT DETECTED NOT DETECTED Final   Parainfluenza Virus 3 NOT DETECTED NOT DETECTED Final   Parainfluenza Virus 4 NOT DETECTED NOT DETECTED Final   Respiratory Syncytial Virus NOT DETECTED NOT DETECTED Final   Bordetella pertussis NOT DETECTED NOT DETECTED Final   Chlamydophila pneumoniae NOT DETECTED NOT DETECTED Final   Mycoplasma pneumoniae NOT DETECTED NOT DETECTED Final    Comment: Performed at [facility], [place] [address] [place], [place] [address]  MRSA PCR Screening     Status: None   Collection Time: 08/01/2017  9:43 PM  Result Value Ref Range Status   MRSA by PCR NEGATIVE NEGATIVE Final    Comment:        The GeneXpert MRSA Assay (FDA approved for NASAL specimens only), is one component of a comprehensive MRSA colonization surveillance program. It is not intended to diagnose MRSA infection nor to guide or monitor treatment for MRSA infections. Performed at [facility], [place] [address] [place], [place] [address]   Culture, blood (Routine X 2) w Reflex to ID Panel     Status: None (Preliminary result)   Collection Time: 07/17/17 12:06 PM  Result Value Ref Range  Status   Specimen Description   Final    BLOOD RIGHT HAND Performed at [facility] [address] [place], [place] [address]    Special Requests   Final    BOTTLES DRAWN AEROBIC AND ANAEROBIC Blood Culture adequate volume Performed at [facility] [address], [place], [place] [address]  Culture   Final    NO GROWTH < 24 HOURS Performed at [facility], [place] [address], [place], [place] [address]    Report Status PENDING  Incomplete  Culture, blood (Routine X 2) w Reflex to ID Panel     Status: None (Preliminary result)   Collection Time: 07/17/17 12:07 PM  Result Value Ref Range Status   Specimen Description   Final    BLOOD LEFT HAND Performed at [facility] [address] [place], [place] [address]    Special Requests   Final    BOTTLES DRAWN AEROBIC ONLY Blood Culture adequate volume Performed at [facility] [address] [place], [place] [address]    Culture   Final    NO GROWTH < 24 HOURS Performed at [facility] [address] [place], [place] [address]    Report Status PENDING  Incomplete    Radiology Reports Ct Abdomen Pelvis Wo Contrast  Result Date: 08/08/2017 CLINICAL DATA:  Hypotension and abdominal pain EXAM: CT ABDOMEN AND PELVIS WITHOUT CONTRAST TECHNIQUE: Multidetector CT imaging of the abdomen and pelvis was performed following the standard protocol without IV contrast. COMPARISON:  None. FINDINGS: Lower chest: Lung bases are well aerated with the exception of mild lingular infiltrate adjacent to the heart. Hepatobiliary: No focal liver abnormality is seen. Status post cholecystectomy. No biliary dilatation. Pancreas: Unremarkable. No pancreatic ductal dilatation or surrounding inflammatory changes. Spleen: Normal in size without focal abnormality. Adrenals/Urinary Tract: The adrenals are within normal limits. Small nonobstructing stones are noted within the  left kidney. The collecting systems are otherwise within normal limits. The bladder is partially decompressed. Stomach/Bowel: Postsurgical changes are noted in the stomach. A sliding-type hiatal hernia is noted. 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 The appendix is within normal limits. Vascular/Lymphatic: No significant vascular findings are present. No enlarged abdominal or pelvic lymph nodes. Reproductive: Uterus and bilateral adnexa are unremarkable. Other: No abdominal wall hernia or abnormality. No abdominopelvic ascites. Musculoskeletal: Degenerative changes of lumbar spine are noted. IMPRESSION: Nonobstructing left renal stones. Postoperative changes as described. Mild lingular infiltrate. Electronically Signed   By: Inez Catalina M.D.   On: 07/26/2017 15:49   Dg Lumbar Spine Complete  Result Date: 07/11/2017 CLINICAL DATA:  Low back pain into hips for 2 days, fell 1 week ago EXAM: LUMBAR SPINE - COMPLETE 4+ VIEW COMPARISON:  CT abdomen and pelvis 11/17/2010 FINDINGS: Osseous demineralization. Five non-rib-bearing lumbar vertebra. Minimal biconvex thoracolumbar scoliosis. Multilevel degenerative disc disease changes with disc space narrowing and endplate spur formation. Facet degenerative changes lower lumbar spine. Vertebral body heights maintained without fracture or bone destruction. Minimal anterolisthesis at L4-L5 and retrolisthesis at L2-L3 noted. No definite spondylolysis. SI joints preserved. IMPRESSION: Osseous demineralization with degenerative disc and facet disease changes of the lumbar spine with associated biconvex thoracolumbar scoliosis and minimal listhesis. No acute abnormalities. Electronically Signed   By: Lavonia Dana M.D.   On: 07/11/2017 15:05   Ct Head Wo Contrast  Result Date: 07/13/2017 CLINICAL DATA:  Altered level of consciousness over the last 2 days, fatigue, hypotension EXAM: CT HEAD WITHOUT CONTRAST TECHNIQUE: Contiguous axial images were obtained from  the base of the skull through the vertex without intravenous contrast. COMPARISON:  MR brain scan of 05/12/2014 FINDINGS: Brain: The ventricular system is normal in size and configuration and the septum is in a normal midline position. The fourth ventricle and basilar cisterns are unremarkable. No hemorrhage, mass  lesion, or acute infarction is seen. 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 Vascular: No vascular abnormality is seen on this unenhanced study. Skull: On bone window images, changes of prior left mastoidectomy are noted. No acute calvarial abnormality is seen. Sinuses/Orbits: The paranasal sinuses appear well pneumatized. Other: None. IMPRESSION: 1. No acute intracranial abnormality. 2. 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 Electronically Signed   By: Ivar Drape M.D.   On: 07/14/2017 15:45   Ct Lumbar Spine Wo Contrast  Result Date: 07/14/2017 CLINICAL DATA:  Initial evaluation for low back pain with bilateral leg pain. EXAM: CT LUMBAR SPINE WITHOUT CONTRAST TECHNIQUE: Multidetector CT imaging of the lumbar spine was performed without intravenous contrast administration. Multiplanar CT image reconstructions were also generated. COMPARISON:  Prior radiograph 07/11/2017. FINDINGS: Segmentation: Normal segmentation. Lowest well-formed disc labeled the L5-S1 level. Alignment: 3 mm retrolisthesis of L2 on L3, with up to 4 mm retrolisthesis of L4 on L5. Findings are likely chronic and facet mediated. Mild levoscoliosis, apex at L3. Vertebrae: Vertebral body heights maintained without evidence for acute or chronic fracture. Chronic reactive endplate changes present about the right aspect of the L3-4 interspace due to scoliotic curvature. No discrete lytic or blastic osseous lesions. Visualized sacrum and pelvis unremarkable. Paraspinal and other soft tissues: No acute paraspinous soft tissue abnormality. Patient status post gastric bypass.  Punctate nonobstructive left renal nephrolithiasis. Disc levels: L1-2: Mild diffuse disc bulge, asymmetric to the right. Mild bilateral facet hypertrophy. No significant canal stenosis. Mild right L1 foraminal narrowing. L2-3: Retrolisthesis. Chronic intervertebral disc space narrowing with disc bulge. Mild facet hypertrophy. Resultant mild canal with moderate bilateral L2 foraminal stenosis. L3-4: Chronic intervertebral disc space narrowing with diffuse disc bulge. Associated disc desiccation noted. Moderate facet hypertrophy. Resultant moderate canal with bilateral lateral recess narrowing. Severe right with moderate left L3 foraminal stenosis. L4-5: Anterolisthesis. Diffuse disc bulge, slightly asymmetric to the right. Severe facet arthropathy. Resultant severe canal with bilateral subarticular stenosis. Moderate bilateral L4 foraminal narrowing, slightly worse on the left. L5-S1: Diffuse disc bulge with intervertebral disc space narrowing. Severe left with moderate right facet arthrosis. Resultant mild left lateral recess narrowing without significant canal stenosis. Moderate left L5 foraminal narrowing. No significant right foraminal encroachment. IMPRESSION: 1. No acute abnormality within the lumbar spine. 2. Levoscoliosis with multilevel degenerative spondylolysis with resultant multilevel canal narrowing as above, moderate at L3-4 and severe at L4-5. 3. Multifactorial degenerative changes with resultant multilevel foraminal narrowing as above. 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 4. Punctate nonobstructive left renal nephrolithiasis. Electronically Signed   By: Jeannine Boga M.D.   On: 07/14/2017 20:24   Mr Brain Wo Contrast  Result Date: 07/17/2017 CLINICAL DATA:  Worsening mental status. 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 EXAM: MRI HEAD WITHOUT CONTRAST TECHNIQUE: Multiplanar, multiecho pulse sequences of the brain and surrounding structures were obtained without intravenous contrast. COMPARISON:  Head CT 07/15/2017.  MRI 05/12/2014. FINDINGS: Brain: Diffusion imaging does not show any acute or subacute infarction. There has been previous left mastoidectomy which appears the same as on previous exams. No evidence of any abnormality of the brainstem, cerebellum or cerebral hemispheres. No old infarction, mass lesion, hemorrhage, hydrocephalus or extra-axial collection. Vascular: Major vessels at the base of the brain show flow. Skull and upper cervical spine: Otherwise negative. Sinuses/Orbits:  Clear/normal Other: None IMPRESSION: No acute or reversible finding. No abnormality seen to explain worsening mental status. Distant left mastoidectomy without complicating feature. Electronically Signed   By: Nelson Chimes M.D.   On: 07/17/2017 07:01   Mr Thoracic Spine Wo Contrast  Result Date: 07/17/2017 CLINICAL DATA:  Altered mental status. Sepsis with elevated white blood cell count. Possible meningitis. EXAM: MRI THORACIC AND LUMBAR SPINE WITHOUT CONTRAST TECHNIQUE: Multiplanar and multiecho pulse sequences of the thoracic and lumbar spine were obtained without intravenous contrast. COMPARISON:  CT abdomen and pelvis 07/15/2017. FINDINGS: MRI THORACIC SPINE FINDINGS Alignment: There is exaggeration of the normal thoracic kyphosis. No listhesis. Vertebrae: Vertebral body height and signal are maintained. No evidence of discitis or osteomyelitis. No fracture or worrisome lesion. Cord:  Normal signal throughout. Paraspinal and other soft tissues: A 0.8 cm nodular opacity is seen posteriorly in the right lung on image 12 of series 8. Small bilateral pleural effusions are identified. The patient also has a small hiatal hernia. Disc levels: The central spinal canal and neural foramina are patent at all levels. Small  central disc protrusion at T7-8 is noted. MRI LUMBAR SPINE FINDINGS Segmentation:  Standard. Alignment: Trace retrolisthesis L2 on L3 is identified. There is convex left scoliosis. Vertebrae: No fracture or worrisome lesion. No evidence of infectious process. Conus medullaris and cauda equina: Conus extends to the L1-2 level. Conus and cauda equina appear normal. Paraspinal and other soft tissues: Negative. Disc levels: L1-2: Minimal disc bulge without stenosis. L2-3: Shallow disc bulge is identified. Loss of disc space height is noted. The central canal is mildly narrowed. The foramina are open. L3-4: There is loss of disc space height, a shallow disc bulge and vacuum disc phenomenon. The central canal and foramina remain open. L4-5: Bilateral facet degenerative change with associated small effusions. Shallow disc bulge. The central canal and foramina are open. L5-S1: Shallow disc bulge without stenosis. IMPRESSION: MR THORACIC SPINE IMPRESSION No acute abnormality.  Negative for source of infection. 0.7 cm nodular opacity in the right lung cannot be definitively characterized. Recommend CT chest in 6 months to ensure stability. MR LUMBAR SPINE IMPRESSION No acute abnormality.  Negative for source of infection. Mild central canal narrowing L2-3 due to a shallow disc bulge. Scattered facet arthropathy appearing worst at L4-5. Electronically Signed   By: Inge Rise M.D.   On: 07/17/2017 08:06   Mr Lumbar Spine Wo Contrast  Result Date: 07/17/2017 CLINICAL DATA:  Altered mental status. Sepsis with elevated white blood cell count. Possible meningitis. EXAM: MRI THORACIC AND LUMBAR SPINE WITHOUT CONTRAST TECHNIQUE: Multiplanar and multiecho pulse sequences of the thoracic and lumbar spine were obtained without intravenous contrast. COMPARISON:  CT abdomen and pelvis 08/02/2017. FINDINGS: MRI THORACIC SPINE FINDINGS Alignment: There is exaggeration of the normal thoracic kyphosis. No listhesis. Vertebrae:  Vertebral body height and signal are maintained. No evidence of discitis or osteomyelitis. No fracture or worrisome lesion. Cord:  Normal signal throughout. Paraspinal and other soft tissues: A 0.8 cm nodular opacity is seen posteriorly in the right lung on image 12 of series 8. Small bilateral pleural effusions are identified. The patient also has a small hiatal hernia. Disc levels: The central spinal canal and neural foramina are patent at all levels. Small central disc protrusion at T7-8 is noted. MRI LUMBAR SPINE FINDINGS Segmentation:  Standard. Alignment: Trace retrolisthesis L2 on L3 is identified. There is convex left scoliosis. Vertebrae: No fracture or worrisome lesion. No evidence of infectious process. Conus medullaris and  cauda equina: Conus extends to the L1-2 level. Conus and cauda equina appear normal. Paraspinal and other soft tissues: Negative. Disc levels: L1-2: Minimal disc bulge without stenosis. L2-3: Shallow disc bulge is identified. Loss of disc space height is noted. The central canal is mildly narrowed. The foramina are open. L3-4: There is loss of disc space height, a shallow disc bulge and vacuum disc phenomenon. The central canal and foramina remain open. L4-5: Bilateral facet degenerative change with associated small effusions. Shallow disc bulge. The central canal and foramina are open. L5-S1: Shallow disc bulge without stenosis. IMPRESSION: MR THORACIC SPINE IMPRESSION No acute abnormality.  Negative for source of infection. 0.7 cm nodular opacity in the right lung cannot be definitively characterized. Recommend CT chest in 6 months to ensure stability. MR LUMBAR SPINE IMPRESSION No acute abnormality.  Negative for source of infection. Mild central canal narrowing L2-3 due to a shallow disc bulge. Scattered facet arthropathy appearing worst at L4-5. Electronically Signed   By: Inge Rise M.D.   On: 07/17/2017 08:06   Mr Ankle Right Wo Contrast  Result Date:  07/18/2017 CLINICAL DATA:  Blood cultures positive for MRSA. Pain and swelling of both ankles. EXAM: MRI OF THE RIGHT ANKLE WITHOUT CONTRAST TECHNIQUE: Multiplanar, multisequence MR imaging of the ankle was performed. No intravenous contrast was administered. COMPARISON:  None. FINDINGS: TENDONS Peroneal: Intact. Posteromedial: Intact. Anterior: Intact. Achilles: The distal tendon is somewhat thickened with calcifications within it consistent with chronic tendinosis. Mild edema in a calcaneal spur at the tendon insertion is noted. Plantar Fascia: Intact.  Very mild edema is seen in the medial cord. LIGAMENTS Lateral: Intact. Medial: Intact. CARTILAGE Ankle Joint: Normal. Subtalar Joints/Sinus Tarsi: Normal. Bones: No marrow signal abnormality to suggest osteomyelitis. No fracture or focal lesion. Other: Subcutaneous edema is present about the ankle. IMPRESSION: Subcutaneous edema about the ankle could be due to dependent change and/or cellulitis. Negative for abscess, septic joint or osteomyelitis. Chronic Achilles tendinopathy. Mild plantar fasciitis of the medial cord. Electronically Signed   By: Inge Rise M.D.   On: 07/18/2017 12:16   Mr Ankle Left Wo Contrast  Result Date: 07/18/2017 CLINICAL DATA:  Blood cultures positive for MRSA. Pain and swelling of both ankles. EXAM: MRI OF THE LEFT ANKLE WITHOUT CONTRAST TECHNIQUE: Multiplanar, multisequence MR imaging of the ankle was performed. No intravenous contrast was administered. COMPARISON:  None. FINDINGS: TENDONS Peroneal: Intact. Posteromedial: Intact. Anterior: Intact. Achilles: Intact. The distal tendon is thickened with calcifications within it. Mild marrow edema is seen in a calcaneal spur at the Achilles tendon insertion. Plantar Fascia: Unremarkable. LIGAMENTS Lateral: Intact. Medial: Intact. CARTILAGE Ankle Joint: Normal. Subtalar Joints/Sinus Tarsi: Unremarkable. Bones: Normal marrow signal throughout. No evidence of osteomyelitis. Other:  Subcutaneous edema is present about the ankle. No focal fluid collection. IMPRESSION: Subcutaneous edema about the ankle could be due to dependent change or cellulitis. Negative for osteomyelitis, abscess or septic joint. Chronic Achilles tendinopathy. Electronically Signed   By: Inge Rise M.D.   On: 07/18/2017 12:38   Dg Chest Port 1 View  Result Date: 08/06/2017 CLINICAL DATA:  Cough. EXAM: PORTABLE CHEST 1 VIEW COMPARISON:  Chest x-ray dated June 13, 2017. FINDINGS: The heart size and mediastinal contours are within normal limits. Normal pulmonary vascularity. Resolved consolidation in the lingula with residual scarring/atelectasis. No focal consolidation, pleural effusion, or pneumothorax. No acute osseous abnormality. IMPRESSION: No active disease. Electronically Signed   By: Titus Dubin M.D.   On: 07/23/2017 13:11  Time Spent in minutes  30 min   Velvet Bathe M.D on 07/13/2017 at 10:14 AM  Between 7am to 7pm - Pager [phone]  After 7pm go to [email or web address] - password [facility]  [facility] -  Office  [phone]

## 2017-07-19 NOTE — Progress Notes (Signed)
Consulted ortho for consideration of BL ankle joint aspiration as recommended by ID on 5/07 to be sent out for gram stain, culture, and crystal analysis.  Full note to follow.  Velvet Bathe MD

## 2017-07-19 NOTE — Progress Notes (Addendum)
Progress Note  Patient Name: Theresa Morrow Date of Encounter: 07/20/2017  Primary Cardiologist: No primary care provider on file.   Subjective   Interval development is blood cultures positive for Staphylococcus aureus, so far only 1 of the 4 cultures drawn on May 6. MRSA by PCR was negative  Resting comfortably, rouses to verbal, no complaints  Inpatient Medications    Scheduled Meds: . aspirin  325 mg Oral Daily  . mouth rinse  15 mL Mouth Rinse BID  . methocarbamol  500 mg Oral BID  . pantoprazole  40 mg Oral Daily   Continuous Infusions: . sodium chloride    . sodium chloride 20 mL/hr at 07/13/2017 0345  . diltiazem (CARDIZEM) infusion 10 mg/hr (08/07/2017 0727)  . heparin 1,950 Units/hr (08/05/2017 0000)  . vancomycin Stopped (07/18/17 1505)   PRN Meds: acetaminophen, morphine injection, morphine injection   Vital Signs    Vitals:   07/11/2017 0400 07/28/2017 0500 08/05/2017 0551 07/18/2017 0600  BP: 114/71   112/77  Pulse: 84 (!) 126  (!) 45  Resp: (!) 9 13  (!) 23  Temp:   98.1 F (36.7 C)   TempSrc:   Oral   SpO2: 95% 93%  97%  Weight:      Height:        Intake/Output Summary (Last 24 hours) at 07/14/2017 0752 Last data filed at 08/10/2017 0551 Gross per 24 hour  Intake 602.55 ml  Output 1675 ml  Net -1072.45 ml   Filed Weights   08/04/2017 1234  Weight: 182 lb (82.6 kg)    Telemetry    Atrial fibrillation with ventricular rates in the 100-110 range- Personally Reviewed  ECG    No new tracing- Personally Reviewed  Physical Exam  Appears ill, but comfortable at rest GEN: No acute distress.   Neck: No JVD Cardiac:  Irregular, no murmurs, rubs, or gallops.  Respiratory: Clear to auscultation bilaterally. GI: Soft, nontender, non-distended  MS: No edema; No deformity. Neuro:  Nonfocal  Psych: Normal affect   Labs    Chemistry Recent Labs  Lab 07/13/2017 1238  07/29/2017 2248 07/17/17 0443 07/18/17 0311  NA 138  --  138  --  138  K 4.0  --  3.8  --   4.3  CL 104  --  111  --  111  CO2 18*  --  17*  --  17*  GLUCOSE 72  --  148*  --  129*  BUN 63*  --  56*  --  53*  CREATININE 2.56*   < > 1.83* 1.72* 1.36*  CALCIUM 8.5*  --  7.7*  --  7.8*  PROT 6.2*  --  5.1*  --  5.4*  ALBUMIN 2.7*  --  2.1*  --  2.1*  AST 72*  --  49*  --  30  ALT 39  --  32  --  26  ALKPHOS 161*  --  129*  --  153*  BILITOT 1.2  --  1.1  --  0.8  GFRNONAA 19*   < > 28* 31* 41*  GFRAA 22*   < > 33* 36* 47*  ANIONGAP 16*  --  10  --  10   < > = values in this interval not displayed.     Hematology Recent Labs  Lab 07/17/17 0443 07/18/17 0311 07/23/2017 0418  WBC 25.2* 28.4* 14.8*  RBC 3.98 3.85* 3.60*  HGB 11.0* 10.8* 10.1*  HCT 33.7* 32.6* 30.5*  MCV 84.7 84.7 84.7  MCH 27.6 28.1 28.1  MCHC 32.6 33.1 33.1  RDW 16.3* 16.7* 16.4*  PLT 122* 144* 156    Cardiac Enzymes Recent Labs  Lab 07/29/2017 1733 07/17/2017 2248 07/17/17 0443  TROPONINI <0.03 0.03* 0.03*      DDimer  Recent Labs  Lab 08/08/2017 1748  DDIMER 7.82*     Radiology    Mr Ankle Right Wo Contrast  Result Date: 07/18/2017 CLINICAL DATA:  Blood cultures positive for MRSA. Pain and swelling of both ankles. EXAM: MRI OF THE RIGHT ANKLE WITHOUT CONTRAST TECHNIQUE: Multiplanar, multisequence MR imaging of the ankle was performed. No intravenous contrast was administered. COMPARISON:  None. FINDINGS: TENDONS Peroneal: Intact. Posteromedial: Intact. Anterior: Intact. Achilles: The distal tendon is somewhat thickened with calcifications within it consistent with chronic tendinosis. Mild edema in a calcaneal spur at the tendon insertion is noted. Plantar Fascia: Intact.  Very mild edema is seen in the medial cord. LIGAMENTS Lateral: Intact. Medial: Intact. CARTILAGE Ankle Joint: Normal. Subtalar Joints/Sinus Tarsi: Normal. Bones: No marrow signal abnormality to suggest osteomyelitis. No fracture or focal lesion. Other: Subcutaneous edema is present about the ankle. IMPRESSION: Subcutaneous edema  about the ankle could be due to dependent change and/or cellulitis. Negative for abscess, septic joint or osteomyelitis. Chronic Achilles tendinopathy. Mild plantar fasciitis of the medial cord. Electronically Signed   By: Inge Rise M.D.   On: 07/18/2017 12:16   Mr Ankle Left Wo Contrast  Result Date: 07/18/2017 CLINICAL DATA:  Blood cultures positive for MRSA. Pain and swelling of both ankles. EXAM: MRI OF THE LEFT ANKLE WITHOUT CONTRAST TECHNIQUE: Multiplanar, multisequence MR imaging of the ankle was performed. No intravenous contrast was administered. COMPARISON:  None. FINDINGS: TENDONS Peroneal: Intact. Posteromedial: Intact. Anterior: Intact. Achilles: Intact. The distal tendon is thickened with calcifications within it. Mild marrow edema is seen in a calcaneal spur at the Achilles tendon insertion. Plantar Fascia: Unremarkable. LIGAMENTS Lateral: Intact. Medial: Intact. CARTILAGE Ankle Joint: Normal. Subtalar Joints/Sinus Tarsi: Unremarkable. Bones: Normal marrow signal throughout. No evidence of osteomyelitis. Other: Subcutaneous edema is present about the ankle. No focal fluid collection. IMPRESSION: Subcutaneous edema about the ankle could be due to dependent change or cellulitis. Negative for osteomyelitis, abscess or septic joint. Chronic Achilles tendinopathy. Electronically Signed   By: Inge Rise M.D.   On: 07/18/2017 12:38    Cardiac Studies   Echo Jul 17, 2017  - Left ventricle: The cavity size was normal. Wall thickness was   normal. Systolic function was vigorous. The estimated ejection   fraction was in the range of 65% to 70%. Wall motion was normal;   there were no regional wall motion abnormalities. - Aortic valve: There was mild regurgitation. - Mitral valve: There was mild regurgitation. - Left atrium: The atrium was mildly dilated. - Pulmonary arteries: PA peak pressure: 31 mm Hg (S).  Impressions:  - Vigorous LV systolic function; mild AI and MR; mild  LAE; mild TR.  Patient Profile     62 y.o. female presenting with sepsis, staph bacteremia, new onset atrial fibrillation with rapid ventricular response that has now persisted for well over 24 hours.  Assessment & Plan    1. AFib: She does have mild left atrial enlargement on echo, but no other abnormalities of note and no chronic cardiac risk factors.  Suspect that the arrhythmia is related to the acute illness.  She is getting better and is more likely to maintain normal rhythm after cardioversion at this  point.  - TEE cardioversion is scheduled for 10:30 am  2. S.aureus bacteremia: TEE 05/09 around 10:30 AM, to be followed by cardioversion - Discussed with Dr. Londell Moh, will perform at the bedside at [facility] and he will assist with sedation.  3. Sepsis:  Resolved  4. AKI: Continues to improve  For questions or updates, please contact [place] Please consult [email or web address] for contact info under Cardiology/STEMI.      Signed, Rosaria Ferries, PA-C  07/29/2017, 7:52 AM   I have seen and examined the patient along with Rosaria Ferries, PA.  I have reviewed the chart, notes and new data.  I agree with PA/NP's note.  Key new complaints: feels tired, looks sleepier and weaker to me Key examination changes: remains in atrial fibrillation, no rashes, skin lesions or murmurs Key new findings / data: improving renal function  PLAN: TEE and DCCV today. This procedure has been fully reviewed with the patient and written informed consent has been obtained.  Sanda Klein, MD, [place] [phone] 07/23/2017, 11:07 AM

## 2017-07-19 NOTE — Progress Notes (Signed)
Dr Wendee Beavers notifed of temp 102.2. Tylenol given as ordered./

## 2017-07-19 NOTE — Progress Notes (Signed)
Procedure: Electrical Cardioversion Indications:  Atrial Fibrillation  Procedure Details:  Consent: Risks of procedure as well as the alternatives and risks of each were explained to the (patient/caregiver).  Consent for procedure obtained.  Time Out: Verified patient identification, verified procedure, site/side was marked, verified correct patient position, special equipment/implants available, medications/allergies/relevent history reviewed, required imaging and test results available.  Performed  Patient placed on cardiac monitor, pulse oximetry, supplemental oxygen as necessary.  Sedation given: Versed 3 mg, fentanyl 125 mcg IV.  Sedation supervised by the pulmonary service, Dr. Lamonte Sakai. Pacer pads placed anterior and posterior chest.  Cardioverted 1 time(s).  Cardioversion with synchronized biphasic 120J shock.  Evaluation: Findings: Post procedure EKG shows: NSR Complications: None Patient did tolerate procedure well.  Time Spent Directly with the Patient:  30 minutes   Theresa Morrow 07/15/2017, 11:14 AM

## 2017-07-19 NOTE — Progress Notes (Signed)
Subjective:  Pt asleep post TEE   Antibiotics:  Anti-infectives (From admission, onward)   Start     Dose/Rate Route Frequency Ordered Stop   08/01/2017 2200  vancomycin (VANCOCIN) IVPB 750 mg/150 ml premix     750 mg 150 mL/hr over 60 Minutes Intravenous Every 12 hours 08/02/2017 1308     07/18/17 2000  vancomycin (VANCOCIN) IVPB 1000 mg/200 mL premix  Status:  Discontinued     1,000 mg 200 mL/hr over 60 Minutes Intravenous Every 48 hours 07/12/2017 1931 07/17/17 1154   07/18/17 1200  vancomycin (VANCOCIN) 1,250 mg in sodium chloride 0.9 % 250 mL IVPB  Status:  Discontinued     1,250 mg 166.7 mL/hr over 90 Minutes Intravenous Every 36 hours 07/18/17 0728 08/02/2017 1308   07/17/17 1400  ceFEPIme (MAXIPIME) 1 g in sodium chloride 0.9 % 100 mL IVPB  Status:  Discontinued     1 g 200 mL/hr over 30 Minutes Intravenous Every 24 hours 07/31/2017 1931 07/17/17 0901   07/17/17 1200  vancomycin (VANCOCIN) IVPB 1000 mg/200 mL premix  Status:  Discontinued     1,000 mg 200 mL/hr over 60 Minutes Intravenous Every 36 hours 07/17/17 1154 07/18/17 0728   07/17/17 1000  ceFEPIme (MAXIPIME) 1 g in sodium chloride 0.9 % 100 mL IVPB  Status:  Discontinued     1 g 200 mL/hr over 30 Minutes Intravenous Every 12 hours 07/17/17 0901 07/17/17 1149   07/18/2017 2000  vancomycin (VANCOCIN) 500 mg in sodium chloride 0.9 % 100 mL IVPB     500 mg 100 mL/hr over 60 Minutes Intravenous  Once 07/27/2017 1931 07/30/2017 2220   07/26/2017 1900  cefTRIAXone (ROCEPHIN) 1 g in sodium chloride 0.9 % 100 mL IVPB  Status:  Discontinued     1 g 200 mL/hr over 30 Minutes Intravenous Every 24 hours 07/14/2017 1849 07/17/2017 1901   07/14/2017 1900  azithromycin (ZITHROMAX) 500 mg in sodium chloride 0.9 % 250 mL IVPB  Status:  Discontinued     500 mg 250 mL/hr over 60 Minutes Intravenous Every 24 hours 08/05/2017 1849 08/04/2017 1901   07/30/2017 1300  piperacillin-tazobactam (ZOSYN) IVPB 3.375 g  Status:  Discontinued     3.375 g 100  mL/hr over 30 Minutes Intravenous  Once 07/26/2017 1245 08/05/2017 1257   07/11/2017 1300  vancomycin (VANCOCIN) IVPB 1000 mg/200 mL premix     1,000 mg 200 mL/hr over 60 Minutes Intravenous  Once 07/29/2017 1245 07/23/2017 1424   07/31/2017 1300  ceFEPIme (MAXIPIME) 2 g in sodium chloride 0.9 % 100 mL IVPB     2 g 200 mL/hr over 30 Minutes Intravenous  Once 07/30/2017 1300 07/22/2017 1346      Medications: Scheduled Meds: . aspirin  325 mg Oral Daily  . mouth rinse  15 mL Mouth Rinse BID  . methocarbamol  500 mg Oral BID  . pantoprazole  40 mg Oral Daily   Continuous Infusions: . sodium chloride 20 mL/hr at 07/27/2017 1414  . sodium chloride 20 mL/hr at 07/17/2017 0345  . heparin 1,950 Units/hr (08/06/2017 1400)  . vancomycin     PRN Meds:.acetaminophen, morphine injection, morphine injection    Objective: Weight change:   Intake/Output Summary (Last 24 hours) at 07/27/2017 1754 Last data filed at 07/15/2017 1658 Gross per 24 hour  Intake 627.72 ml  Output 2125 ml  Net -1497.28 ml   Blood pressure (!) 167/78, pulse 96, temperature 99.6 F (37.6 C), temperature  source Oral, resp. rate 15, height 5\' 7"  (1.702 m), weight 182 lb (82.6 kg), SpO2 99 %. Temp:  [98 F (36.7 C)-102.2 F (39 C)] 99.6 F (37.6 C) (05/09 1651) Pulse Rate:  [45-126] 96 (05/09 1500) Resp:  [9-23] 15 (05/09 1500) BP: (112-167)/(68-92) 167/78 (05/09 1500) SpO2:  [93 %-100 %] 99 % (05/09 1500)  Physical Exam: General: asleep and difficult to arouse  CVS tachy rate regula no murmur rubs or gallops Chest: clear to auscultation bilaterally,anteriorly Abdomen: soft  nondistended,   Extremities/Skin:  Legs 07/31/2017:       Legs still edematous and erythema 07/18/17:      Neuro: nonfocal  CBC:  CBC Latest Ref Rng & Units 07/31/2017 07/18/2017 07/17/2017  WBC 4.0 - 10.5 K/uL 14.8(H) 28.4(H) 25.2(H)  Hemoglobin 12.0 - 15.0 g/dL 10.1(L) 10.8(L) 11.0(L)  Hematocrit 36.0 - 46.0 % 30.5(L) 32.6(L) 33.7(L)  Platelets 150 -  400 K/uL 156 144(L) 122(L)      BMET Recent Labs    07/20/2017 2248  07/18/17 0311 08/05/2017 1211  NA 138  --  138  --   K 3.8  --  4.3  --   CL 111  --  111  --   CO2 17*  --  17*  --   GLUCOSE 148*  --  129*  --   BUN 56*  --  53*  --   CREATININE 1.83*   < > 1.36* 0.82  CALCIUM 7.7*  --  7.8*  --    < > = values in this interval not displayed.     Liver Panel  Recent Labs    07/14/2017 2248 07/18/17 0311  PROT 5.1* 5.4*  ALBUMIN 2.1* 2.1*  AST 49* 30  ALT 32 26  ALKPHOS 129* 153*  BILITOT 1.1 0.8       Sedimentation Rate No results for input(s): ESRSEDRATE in the last 72 hours. C-Reactive Protein Recent Labs    07/18/2017 2032  CRP 36.7*    Micro Results: Recent Results (from the past 720 hour(s))  Blood Culture (routine x 2)     Status: Abnormal   Collection Time: 07/17/2017  1:26 PM  Result Value Ref Range Status   Specimen Description   Final    RIGHT ANTECUBITAL Performed at [facility] [address] [place], [place] [address]    Special Requests   Final    BOTTLES DRAWN AEROBIC AND ANAEROBIC Blood Culture adequate volume Performed at [facility] [address] [place], [place] [address]    Culture  Setup Time   Final    GRAM POSITIVE COCCI IN BOTH AEROBIC AND ANAEROBIC BOTTLES CRITICAL RESULT CALLED TO, READ BACK BY AND VERIFIED WITH: Chales Abrahams PharmD 11:10 07/17/17 ([email or web address]) Performed at [facility], [place] [address] [place], [place] [address]    Culture METHICILLIN RESISTANT STAPHYLOCOCCUS AUREUS (A)  Final   Report Status 07/30/2017 FINAL  Final   Organism ID, Bacteria METHICILLIN RESISTANT STAPHYLOCOCCUS AUREUS  Final      Susceptibility   Methicillin resistant staphylococcus aureus - MIC*    CIPROFLOXACIN >=8 RESISTANT Resistant     ERYTHROMYCIN >=8 RESISTANT Resistant     GENTAMICIN <=0.5 SENSITIVE Sensitive     OXACILLIN >=4 RESISTANT Resistant     TETRACYCLINE <=1 SENSITIVE Sensitive      VANCOMYCIN 1 SENSITIVE Sensitive     TRIMETH/SULFA <=10 SENSITIVE Sensitive     CLINDAMYCIN <=0.25 SENSITIVE Sensitive     RIFAMPIN <=  0.5 SENSITIVE Sensitive     Inducible Clindamycin NEGATIVE Sensitive     * METHICILLIN RESISTANT STAPHYLOCOCCUS AUREUS  Blood Culture (routine x 2)     Status: None (Preliminary result)   Collection Time: 07/13/2017  1:26 PM  Result Value Ref Range Status   Specimen Description   Final    LEFT ANTECUBITAL Performed at [facility] [address] [place], [place] [address]    Special Requests   Final    BOTTLES DRAWN AEROBIC AND ANAEROBIC Blood Culture results may not be optimal due to an excessive volume of blood received in culture bottles Performed at [facility] [address] [place], [place] [address]    Culture   Final    NO GROWTH 3 DAYS Performed at [facility], [place] [address] [place], [place] [address]    Report Status PENDING  Incomplete  Urine culture     Status: None   Collection Time: 07/31/2017  1:26 PM  Result Value Ref Range Status   Specimen Description   Final    URINE, CLEAN CATCH Performed at [facility], [place] [address], [place], [place] [address]    Special Requests   Final    Normal Performed at [facility], [place] [address] [place], [place] [address]    Culture   Final    NO GROWTH Performed at [facility], [place] [address] [place],  [address]    Report Status 07/18/2017 FINAL  Final  Blood Culture ID Panel (Reflexed)     Status: Abnormal   Collection Time: 08/08/2017  1:26 PM  Result Value Ref Range Status   Enterococcus species NOT DETECTED NOT DETECTED Final   Listeria monocytogenes NOT DETECTED NOT DETECTED Final   Staphylococcus species DETECTED (A) NOT DETECTED Final    Comment: CRITICAL RESULT CALLED TO, READ BACK BY AND VERIFIED WITH: Chales Abrahams PharmD 11:10 07/17/17 ([email or web address])    Staphylococcus aureus DETECTED (A)  NOT DETECTED Final    Comment: Methicillin (oxacillin)-resistant Staphylococcus aureus (MRSA). MRSA is predictably resistant to beta-lactam antibiotics (except ceftaroline). Preferred therapy is vancomycin unless clinically contraindicated. Patient requires contact precautions if  hospitalized. CRITICAL RESULT CALLED TO, READ BACK BY AND VERIFIED WITH: Chales Abrahams PharmD 11:10 07/17/17 ([email or web address])    Methicillin resistance DETECTED (A) NOT DETECTED Final    Comment: CRITICAL RESULT CALLED TO, READ BACK BY AND VERIFIED WITH: Chales Abrahams PharmD 11:10 07/17/17 ([email or web address])    Streptococcus species NOT DETECTED NOT DETECTED Final   Streptococcus agalactiae NOT DETECTED NOT DETECTED Final   Streptococcus pneumoniae NOT DETECTED NOT DETECTED Final   Streptococcus pyogenes NOT DETECTED NOT DETECTED Final   Acinetobacter baumannii NOT DETECTED NOT DETECTED Final   Enterobacteriaceae species NOT DETECTED NOT DETECTED Final   Enterobacter cloacae complex NOT DETECTED NOT DETECTED Final   Escherichia coli NOT DETECTED NOT DETECTED Final   Klebsiella oxytoca NOT DETECTED NOT DETECTED Final   Klebsiella pneumoniae NOT DETECTED NOT DETECTED Final   Proteus species NOT DETECTED NOT DETECTED Final   Serratia marcescens NOT DETECTED NOT DETECTED Final   Haemophilus influenzae NOT DETECTED NOT DETECTED Final   Neisseria meningitidis NOT DETECTED NOT DETECTED Final   Pseudomonas aeruginosa NOT DETECTED NOT DETECTED Final   Candida albicans NOT DETECTED NOT DETECTED Final   Candida glabrata NOT DETECTED NOT DETECTED Final   Candida krusei NOT DETECTED NOT DETECTED Final   Candida parapsilosis NOT DETECTED NOT DETECTED Final  Candida tropicalis NOT DETECTED NOT DETECTED Final    Comment: Performed at [facility], [place] [address], [place], [place] [address]  Culture, blood (routine x 2) Call MD if unable to obtain prior to antibiotics being given     Status: None (Preliminary result)   Collection Time: 08/10/2017   8:35 PM  Result Value Ref Range Status   Specimen Description   Final    BLOOD LEFT ANTECUBITAL Performed at [facility] [address], [place], [place] [address]    Special Requests   Final    BOTTLES DRAWN AEROBIC AND ANAEROBIC Blood Culture adequate volume Performed at [facility] [address] [place], [place] [address]    Culture   Final    NO GROWTH 3 DAYS Performed at [facility], [place] [address], [place], [place] [address]    Report Status PENDING  Incomplete  Culture, blood (routine x 2) Call MD if unable to obtain prior to antibiotics being given     Status: None (Preliminary result)   Collection Time: 07/15/2017  9:00 PM  Result Value Ref Range Status   Specimen Description   Final    BLOOD LEFT ARM Performed at [facility] [address], [place], [place] [address]    Special Requests   Final    BOTTLES DRAWN AEROBIC AND ANAEROBIC Blood Culture adequate volume Performed at [facility] [address] [place], [place] [address]    Culture   Final    NO GROWTH 3 DAYS Performed at [facility], [place] [address], [place], [place] [address]    Report Status PENDING  Incomplete  Respiratory Panel by PCR     Status: None   Collection Time: 07/26/2017  9:42 PM  Result Value Ref Range Status   Adenovirus NOT DETECTED NOT DETECTED Final   Coronavirus 229E NOT DETECTED NOT DETECTED Final   Coronavirus HKU1 NOT DETECTED NOT DETECTED Final   Coronavirus NL63 NOT DETECTED NOT DETECTED Final   Coronavirus OC43 NOT DETECTED NOT DETECTED Final   Metapneumovirus NOT DETECTED NOT DETECTED Final   Rhinovirus / Enterovirus NOT DETECTED NOT DETECTED Final   Influenza A NOT DETECTED NOT DETECTED Final   Influenza B NOT DETECTED NOT DETECTED Final   Parainfluenza Virus 1 NOT DETECTED NOT DETECTED Final   Parainfluenza Virus 2 NOT DETECTED NOT DETECTED Final   Parainfluenza Virus 3 NOT  DETECTED NOT DETECTED Final   Parainfluenza Virus 4 NOT DETECTED NOT DETECTED Final   Respiratory Syncytial Virus NOT DETECTED NOT DETECTED Final   Bordetella pertussis NOT DETECTED NOT DETECTED Final   Chlamydophila pneumoniae NOT DETECTED NOT DETECTED Final   Mycoplasma pneumoniae NOT DETECTED NOT DETECTED Final    Comment: Performed at [facility], [place] [address] [place], [place] [address]  MRSA PCR Screening     Status: None   Collection Time: 07/22/2017  9:43 PM  Result Value Ref Range Status   MRSA by PCR NEGATIVE NEGATIVE Final    Comment:        The GeneXpert MRSA Assay (FDA approved for NASAL specimens only), is one component of a comprehensive MRSA colonization surveillance program. It is not intended to diagnose MRSA infection nor to guide or monitor treatment for MRSA infections. Performed at [facility], [place] [address] [place], [place] [address]   Culture, blood (Routine X 2) w Reflex to ID Panel  Status: None (Preliminary result)   Collection Time: 07/17/17 12:06 PM  Result Value Ref Range Status   Specimen Description   Final    BLOOD RIGHT HAND Performed at [facility] [address] [place], [place] [address]    Special Requests   Final    BOTTLES DRAWN AEROBIC AND ANAEROBIC Blood Culture adequate volume Performed at [facility] [address] [place], [place] [address]    Culture   Final    NO GROWTH 2 DAYS Performed at [facility] [address] [place], [place] [address]    Report Status PENDING  Incomplete  Culture, blood (Routine X 2) w Reflex to ID Panel     Status: None (Preliminary result)   Collection Time: 07/17/17 12:07 PM  Result Value Ref Range Status   Specimen Description   Final    BLOOD LEFT HAND Performed at [facility] [address] [place], [place] [address]    Special Requests   Final    BOTTLES DRAWN AEROBIC ONLY Blood Culture  adequate volume Performed at [facility] [address], [place], [place] [address]    Culture   Final    NO GROWTH 2 DAYS Performed at [facility] [address], [place], [place] [address]    Report Status PENDING  Incomplete    Studies/Results: Mr Ankle Right Wo Contrast  Result Date: 07/18/2017 CLINICAL DATA:  Blood cultures positive for MRSA. Pain and swelling of both ankles. EXAM: MRI OF THE RIGHT ANKLE WITHOUT CONTRAST TECHNIQUE: Multiplanar, multisequence MR imaging of the ankle was performed. No intravenous contrast was administered. COMPARISON:  None. FINDINGS: TENDONS Peroneal: Intact. Posteromedial: Intact. Anterior: Intact. Achilles: The distal tendon is somewhat thickened with calcifications within it consistent with chronic tendinosis. Mild edema in a calcaneal spur at the tendon insertion is noted. Plantar Fascia: Intact.  Very mild edema is seen in the medial cord. LIGAMENTS Lateral: Intact. Medial: Intact. CARTILAGE Ankle Joint: Normal. Subtalar Joints/Sinus Tarsi: Normal. Bones: No marrow signal abnormality to suggest osteomyelitis. No fracture or focal lesion. Other: Subcutaneous edema is present about the ankle. IMPRESSION: Subcutaneous edema about the ankle could be due to dependent change and/or cellulitis. Negative for abscess, septic joint or osteomyelitis. Chronic Achilles tendinopathy. Mild plantar fasciitis of the medial cord. Electronically Signed   By: Inge Rise M.D.   On: 07/18/2017 12:16   Mr Ankle Left Wo Contrast  Result Date: 07/18/2017 CLINICAL DATA:  Blood cultures positive for MRSA. Pain and swelling of both ankles. EXAM: MRI OF THE LEFT ANKLE WITHOUT CONTRAST TECHNIQUE: Multiplanar, multisequence MR imaging of the ankle was performed. No intravenous contrast was administered. COMPARISON:  None. FINDINGS: TENDONS Peroneal: Intact. Posteromedial: Intact. Anterior: Intact. Achilles: Intact. The distal tendon is thickened with  calcifications within it. Mild marrow edema is seen in a calcaneal spur at the Achilles tendon insertion. Plantar Fascia: Unremarkable. LIGAMENTS Lateral: Intact. Medial: Intact. CARTILAGE Ankle Joint: Normal. Subtalar Joints/Sinus Tarsi: Unremarkable. Bones: Normal marrow signal throughout. No evidence of osteomyelitis. Other: Subcutaneous edema is present about the ankle. No focal fluid collection. IMPRESSION: Subcutaneous edema about the ankle could be due to dependent change or cellulitis. Negative for osteomyelitis, abscess or septic joint. Chronic Achilles tendinopathy. Electronically Signed   By: Inge Rise M.D.   On: 07/18/2017 12:38      Assessment/Plan:  INTERVAL HISTORY TEE is without vegetations, pt is sp cardioversion  Fevers post TEE  Principal Problem:   ARF (acute renal failure) ([facility]) Active Problems:   Atrial fibrillation with RVR (HCC)   Sepsis (HCC)   UTI (urinary tract infection)   CAP (community acquired pneumonia)   AKI (acute kidney injury) ([facility])   Lactic acidosis   Lumbar back pain   MRSA bacteremia    Theresa Morrow is a 62 y.o. female with  MRSA bacteremia and septic shock. Her source is unclear. She also has pneumococcal + ag in urine.   #1 MRSA bacteremia:     [facility] Staphylococcus aureus bacteremia   Staphylococcus aureus bacteremia (SAB) is associated with a high rate of complications and mortality.  Specific aspects of clinical management are critical to optimizing the outcome of patients with SAB.  Therefore, the [facility] Health Antimicrobial Management Team [facility]) has initiated an intervention aimed at improving the management of SAB at [facility].  To do so, Infectious Diseases physicians are providing an evidence-based consult for the management of all patients with SAB.     Yes No Comments  Perform follow-up blood cultures (even if the patient is afebrile) to ensure clearance of bacteremia [x]  []    Remove  vascular catheter and obtain follow-up blood cultures after the removal of the catheter [x]  []  DO NOT PLACE CENTRAL INE UNTIL WE HAVE NO GROWTH AT 5 days  Perform echocardiography to evaluate for endocarditis (transthoracic ECHO is 40-50% sensitive, TEE is > 90% sensitive) [x]  []  Please keep in mind, that neither test can definitively EXCLUDE endocarditis, and that should clinical suspicion remain high for endocarditis the patient should then still be treated with an "endocarditis" duration of therapy = 6 weeks TEE negative  Consult electrophysiologist to evaluate implanted cardiac device (pacemaker, ICD) []  []    Ensure source control [x]  []  Have all abscesses been drained effectively? Have deep seeded infections (septic joints or osteomyelitis) had appropriate surgical debridement?  Investigate for "metastatic" sites of infection [x]  []  Does the patient have ANY symptom or physical exam finding that would suggest a deeper infection (back or neck pain that may be suggestive of vertebral osteomyelitis or epidural abscess, muscle pain that could be a symptom of pyomyositis)?  Keep in mind that for deep seeded infections MRI imaging with contrast is preferred rather than other often insensitive tests such as plain x-rays, especially early in a patient's presentation.  Change antibiotic therapy to vancomycin []  []  Beta-lactam antibiotics are preferred for MSSA due to higher cure rates.   If on Vancomycin, goal trough should be 15 - 20 mcg/mL  Estimated duration of IV antibiotic therapy:  4\weeks since no removable source identified []  []  Consult case management for probably prolonged outpatient IV antibiotic therapy   #2 Pneumococcus ag in  urine: she is covered with vancomycin  #3 Fevers: wonder is she aspirated post TEE. I would get CXR   If wanted to add antibiotic would add metronidazols vs unasyn. I dont think she would have an HCAP organism like pseudomonas   LOS: 3 days   Alcide Evener 08/02/2017, 5:54 PM

## 2017-07-19 NOTE — Progress Notes (Signed)
ANTICOAGULATION CONSULT NOTE   Pharmacy Consult for heparin Indication: atrial fibrillation  No Known Allergies  Patient Measurements: Height: 5\' 7"  (170.2 cm) Weight: 182 lb (82.6 kg) IBW/kg (Calculated) : 61.6 Heparin Dosing Weight: 78 kg  Vital Signs: Temp: 99.1 F (37.3 C) (05/09 0800) Temp Source: Axillary (05/09 0800) BP: 129/73 (05/09 1200) Pulse Rate: 88 (05/09 1200)  Labs: Recent Labs    08/05/2017 1733 07/27/2017 2032 07/30/2017 2248 07/17/17 0443  07/18/17 0311  07/18/17 2040 07/18/2017 0418 07/28/2017 1211  HGB  --   --  11.6* 11.0*  --  10.8*  --   --  10.1*  --   HCT  --   --  35.3* 33.7*  --  32.6*  --   --  30.5*  --   PLT  --   --  129* 122*  --  144*  --   --  156  --   APTT  --   --  37*  --   --   --   --   --   --   --   LABPROT  --   --  14.4  --   --   --   --   --   --   --   INR  --   --  1.13  --   --   --   --   --   --   --   HEPARINUNFRC  --   --   --   --    < > 0.15*   < > 0.29* 0.43 0.45  CREATININE  --  2.02* 1.83* 1.72*  --  1.36*  --   --   --   --   CKTOTAL  --  79  --   --   --   --   --   --   --   --   CKMB  --  10.4*  --   --   --   --   --   --   --   --   TROPONINI <0.03  --  0.03* 0.03*  --   --   --   --   --   --    < > = values in this interval not displayed.    Estimated Creatinine Clearance: 47.4 mL/min (A) (by C-G formula based on SCr of 1.36 mg/dL (H)).   Assessment: Patient's a 62 y.o F presented to the ED on 08/02/2017 with hypotension, AMS and hallucination.  She was found to be in afib with RVR and now on heparin drip.  5/9: DCCV; TEE neg for endocarditis  Today, 07/23/2017: - confirmatory heparin level now back therapeutic at 0.45 - cbc relatively stable - no bleeding documented  Goal of Therapy:  Heparin level 0.3-0.7 units/ml Monitor platelets by anticoagulation protocol: Yes   Plan:  - continue current heparin drip at 1950 units/hr - daily heparin level - monitor for s/s bleeding  Delphin Funes P 07/12/2017,12:33  PM

## 2017-07-19 NOTE — Progress Notes (Signed)
ANTICOAGULATION CONSULT NOTE - Follow Up Consult  Pharmacy Consult for Heparin Indication: atrial fibrillation  No Known Allergies  Patient Measurements: Height: 5\' 7"  (170.2 cm) Weight: 182 lb (82.6 kg) IBW/kg (Calculated) : 61.6 Heparin Dosing Weight:   Vital Signs: Temp: 98 F (36.7 C) (05/08 2016) Temp Source: Oral (05/08 2016) BP: 135/83 (05/09 0200) Pulse Rate: 119 (05/09 0300)  Labs: Recent Labs    08/03/2017 1733 07/20/2017 2032 07/20/2017 2248 07/17/17 0443  07/18/17 0311 07/18/17 1330 07/18/17 2040 07/30/2017 0418  HGB  --   --  11.6* 11.0*  --  10.8*  --   --  10.1*  HCT  --   --  35.3* 33.7*  --  32.6*  --   --  30.5*  PLT  --   --  129* 122*  --  144*  --   --  156  APTT  --   --  37*  --   --   --   --   --   --   LABPROT  --   --  14.4  --   --   --   --   --   --   INR  --   --  1.13  --   --   --   --   --   --   HEPARINUNFRC  --   --   --   --    < > 0.15* 0.12* 0.29* 0.43  CREATININE  --  2.02* 1.83* 1.72*  --  1.36*  --   --   --   CKTOTAL  --  79  --   --   --   --   --   --   --   CKMB  --  10.4*  --   --   --   --   --   --   --   TROPONINI <0.03  --  0.03* 0.03*  --   --   --   --   --    < > = values in this interval not displayed.    Estimated Creatinine Clearance: 47.4 mL/min (A) (by C-G formula based on SCr of 1.36 mg/dL (H)).   Medications:  Infusions:  . sodium chloride    . sodium chloride 20 mL/hr at 07/28/2017 0345  . diltiazem (CARDIZEM) infusion 10 mg/hr (08/02/2017 0000)  . heparin 1,950 Units/hr (08/01/2017 0000)  . vancomycin Stopped (07/18/17 1505)    Assessment: Patient with heparin level at goal.  No heparin issues noted.  Goal of Therapy:  Heparin level 0.3-0.7 units/ml Monitor platelets by anticoagulation protocol: Yes   Plan:  Continue heparin drip at current rate Recheck level at [place], [place] 07/26/2017,4:55 AM

## 2017-07-19 NOTE — Procedures (Signed)
[facility]  Procedure note  Procedure: Awake sedation for TEE and cardioversion. Requesting Service: Cardiology , Dr. Sallyanne Kuster  Medications: Fentanyl 125 mcg total  Versed 3 mg total  O2 at 6 l/m Continuous saturation monitoring with sats 100 % during procedure.   Recent Labs  Lab 07/18/2017 1238  07/29/2017 2248 07/17/17 0443 07/18/17 0311  NA 138  --  138  --  138  K 4.0  --  3.8  --  4.3  CL 104  --  111  --  111  CO2 18*  --  17*  --  17*  BUN 63*  --  56*  --  53*  CREATININE 2.56*   < > 1.83* 1.72* 1.36*  GLUCOSE 72  --  148*  --  129*   < > = values in this interval not displayed.   Recent Labs  Lab 07/17/17 0443 07/18/17 0311 08/08/2017 0418  HGB 11.0* 10.8* 10.1*  HCT 33.7* 32.6* 30.5*  WBC 25.2* 28.4* 14.8*  PLT 122* 144* 156  Mr Ankle Right Wo Contrast  Result Date: 07/18/2017 CLINICAL DATA:  Blood cultures positive for MRSA. Pain and swelling of both ankles. EXAM: MRI OF THE RIGHT ANKLE WITHOUT CONTRAST TECHNIQUE: Multiplanar, multisequence MR imaging of the ankle was performed. No intravenous contrast was administered. COMPARISON:  None. FINDINGS: TENDONS Peroneal: Intact. Posteromedial: Intact. Anterior: Intact. Achilles: The distal tendon is somewhat thickened with calcifications within it consistent with chronic tendinosis. Mild edema in a calcaneal spur at the tendon insertion is noted. Plantar Fascia: Intact.  Very mild edema is seen in the medial cord. LIGAMENTS Lateral: Intact. Medial: Intact. CARTILAGE Ankle Joint: Normal. Subtalar Joints/Sinus Tarsi: Normal. Bones: No marrow signal abnormality to suggest osteomyelitis. No fracture or focal lesion. Other: Subcutaneous edema is present about the ankle. IMPRESSION: Subcutaneous edema about the ankle could be due to dependent change and/or cellulitis. Negative for abscess, septic joint or osteomyelitis. Chronic Achilles tendinopathy. Mild plantar fasciitis of the medial cord. Electronically  Signed   By: Inge Rise M.D.   On: 07/18/2017 12:16   Mr Ankle Left Wo Contrast  Result Date: 07/18/2017 CLINICAL DATA:  Blood cultures positive for MRSA. Pain and swelling of both ankles. EXAM: MRI OF THE LEFT ANKLE WITHOUT CONTRAST TECHNIQUE: Multiplanar, multisequence MR imaging of the ankle was performed. No intravenous contrast was administered. COMPARISON:  None. FINDINGS: TENDONS Peroneal: Intact. Posteromedial: Intact. Anterior: Intact. Achilles: Intact. The distal tendon is thickened with calcifications within it. Mild marrow edema is seen in a calcaneal spur at the Achilles tendon insertion. Plantar Fascia: Unremarkable. LIGAMENTS Lateral: Intact. Medial: Intact. CARTILAGE Ankle Joint: Normal. Subtalar Joints/Sinus Tarsi: Unremarkable. Bones: Normal marrow signal throughout. No evidence of osteomyelitis. Other: Subcutaneous edema is present about the ankle. No focal fluid collection. IMPRESSION: Subcutaneous edema about the ankle could be due to dependent change or cellulitis. Negative for osteomyelitis, abscess or septic joint. Chronic Achilles tendinopathy. Electronically Signed   By: Inge Rise M.D.   On: 07/18/2017 12:38    General:  Frail 62 yo female HEENT: poor dentition . Multiple missing teeth Neuro: awake and follows command prior to sedation CV: HSIR IR PULM: decreased bs  QQ:VZDG, non-tender, bsx4 active  Extremities: warm/dry,+edema and erythema  Skin: no rashes or lesions     Impression/Plan Principal Problem:   ARF (acute renal failure) (HCC) Active Problems:   Atrial fibrillation with RVR (HCC)   Sepsis (HCC)   UTI (urinary tract infection)   CAP (community  acquired pneumonia)   AKI (acute kidney injury) ([place])   Lactic acidosis   Lumbar back pain   MRSA bacteremia   Sedation for TEE and cardioversion in setting MRSA bacteremia, Afib.  Total sedation as above. Tolerated well. TEE and cardioversion completed completed with out problems. NSR post  cardioversion. HD stable. Will monitor post procedure. No complications.       Richardson Landry Avion Kutzer ACNP [facility] PCC[facility] Pager [phone] till 1 pm If no answer page [phone] 07/23/2017, 10:32 AM

## 2017-07-19 NOTE — Consult Note (Signed)
ORTHOPAEDIC CONSULTATION  REQUESTING PHYSICIAN: Velvet Bathe, MD  PCP:  [facility]  Chief Complaint: I hurt "all over"  HPI: Theresa Morrow is a 62 y.o. female who was admitted with acute renal failure and lethargy 3 days ago.  She was found to have MRSA bacteremia.  She complains of bilateral lower leg pain, left worse than right.  She also has chronic low back pain.  She has pain bilateral upper extremities.  She is on IV antibiotics.  Orthopedic consultation was recommended by infectious disease to rule out septic arthritis of the ankles.  She had MRI of bilateral ankles yesterday which was negative for abscess, osteomyelitis, joint effusion.  Past Medical History:  Diagnosis Date  . Anemia   . Bile duct stone    liver stones  . Cancer [facility])    brain tumor 2007  . Cholangitis   . Choledocholithiasis   . Chronic headaches   . Complication of anesthesia    low heart rate  . GERD (gastroesophageal reflux disease)   . Hepatomegaly   . Kidney stones   . Obesity   . Osteoarthritis    Past Surgical History:  Procedure Laterality Date  . ACOUSTIC NEUROMA RESECTION  2007  . CHOLECYSTECTOMY OPEN  1977  . ESOPHAGOGASTRODUODENOSCOPY N/A 08/15/2013   Procedure: ESOPHAGOGASTRODUODENOSCOPY (EGD);  Surgeon: Lafayette Dragon, MD;  Location: [facility];  Service: Endoscopy;  Laterality: N/A;  . KNEE ARTHROSCOPY Left 2004  . ROUX-EN-Y GASTRIC BYPASS  2011   and removed liver stones   Social History   Socioeconomic History  . Marital status: Married    Spouse name: Not on file  . Number of children: 3  . Years of education: Not on file  . Highest education level: Not on file  Occupational History  . Occupation: bookkeeper  Scientific laboratory technician  . Financial resource strain: Not on file  . Food insecurity:    Worry: Not on file    Inability: Not on file  . Transportation needs:    Medical: Not on file    Non-medical: Not on file  Tobacco Use  .  Smoking status: Never Smoker  . Smokeless tobacco: Never Used  Substance and Sexual Activity  . Alcohol use: No  . Drug use: No  . Sexual activity: Not on file  Lifestyle  . Physical activity:    Days per week: Not on file    Minutes per session: Not on file  . Stress: Not on file  Relationships  . Social connections:    Talks on phone: Not on file    Gets together: Not on file    Attends religious service: Not on file    Active member of club or organization: Not on file    Attends meetings of clubs or organizations: Not on file    Relationship status: Not on file  Other Topics Concern  . Not on file  Social History Narrative  . Not on file   Family History  Problem Relation Age of Onset  . Heart disease Mother   . Gallbladder disease Mother   . Diabetes Mother   . Gallbladder disease Sister   . Gallbladder disease Maternal Grandmother   . Kidney cancer Father   . Colon cancer Neg Hx    No Known Allergies Prior to Admission medications   Medication Sig Start Date End Date Taking? Authorizing Provider  aspirin-acetaminophen-caffeine (EXCEDRIN MIGRAINE) [phone] MG tablet Take 2 tablets by mouth every 6 (  six) hours as needed for headache.   Yes [provider]  calcium carbonate (TUMS - DOSED IN MG ELEMENTAL CALCIUM) 500 MG chewable tablet Chew 1 tablet by mouth as needed for indigestion or heartburn.   Yes [provider]  cephALEXin (KEFLEX) 500 MG capsule Take 1 capsule (500 mg total) by mouth 4 (four) times daily. 07/14/17  Yes Recardo Evangelist, PA-C  Cholecalciferol (VITAMIN D PO) Take 1 tablet by mouth daily.   Yes [provider]  HYDROcodone-acetaminophen (NORCO) 10-325 MG tablet Take 1 tablet by mouth every 6 (six) hours as needed for moderate pain.    Yes [provider]  ibuprofen (ADVIL,MOTRIN) 200 MG tablet Take 400 mg by mouth every 6 (six) hours as needed.   Yes [provider]  methocarbamol (ROBAXIN) 500 MG  tablet Take 1 tablet (500 mg total) by mouth 2 (two) times daily. 07/11/17  Yes Varney Biles, MD  Multiple Vitamin (MULTI-VITAMINS) TABS Take 1 tablet by mouth daily.   Yes [provider]  naproxen (NAPROSYN) 375 MG tablet Take 1 tablet (375 mg total) by mouth 2 (two) times daily. 07/11/17  Yes Varney Biles, MD  oxyCODONE-acetaminophen (PERCOCET/ROXICET) 5-325 MG tablet Take 1 tablet by mouth every 4 (four) hours as needed for severe pain. 07/14/17  Yes Virgel Manifold, MD  pantoprazole (PROTONIX) 40 MG tablet TAKE 1 TABLET(40 MG) BY MOUTH DAILY 11/16/16  Yes Mauri Pole, MD   Mr Ankle Right Wo Contrast  Result Date: 07/18/2017 CLINICAL DATA:  Blood cultures positive for MRSA. Pain and swelling of both ankles. EXAM: MRI OF THE RIGHT ANKLE WITHOUT CONTRAST TECHNIQUE: Multiplanar, multisequence MR imaging of the ankle was performed. No intravenous contrast was administered. COMPARISON:  None. FINDINGS: TENDONS Peroneal: Intact. Posteromedial: Intact. Anterior: Intact. Achilles: The distal tendon is somewhat thickened with calcifications within it consistent with chronic tendinosis. Mild edema in a calcaneal spur at the tendon insertion is noted. Plantar Fascia: Intact.  Very mild edema is seen in the medial cord. LIGAMENTS Lateral: Intact. Medial: Intact. CARTILAGE Ankle Joint: Normal. Subtalar Joints/Sinus Tarsi: Normal. Bones: No marrow signal abnormality to suggest osteomyelitis. No fracture or focal lesion. Other: Subcutaneous edema is present about the ankle. IMPRESSION: Subcutaneous edema about the ankle could be due to dependent change and/or cellulitis. Negative for abscess, septic joint or osteomyelitis. Chronic Achilles tendinopathy. Mild plantar fasciitis of the medial cord. Electronically Signed   By: Inge Rise M.D.   On: 07/18/2017 12:16   Mr Ankle Left Wo Contrast  Result Date: 07/18/2017 CLINICAL DATA:  Blood cultures positive for MRSA. Pain and swelling of both ankles.  EXAM: MRI OF THE LEFT ANKLE WITHOUT CONTRAST TECHNIQUE: Multiplanar, multisequence MR imaging of the ankle was performed. No intravenous contrast was administered. COMPARISON:  None. FINDINGS: TENDONS Peroneal: Intact. Posteromedial: Intact. Anterior: Intact. Achilles: Intact. The distal tendon is thickened with calcifications within it. Mild marrow edema is seen in a calcaneal spur at the Achilles tendon insertion. Plantar Fascia: Unremarkable. LIGAMENTS Lateral: Intact. Medial: Intact. CARTILAGE Ankle Joint: Normal. Subtalar Joints/Sinus Tarsi: Unremarkable. Bones: Normal marrow signal throughout. No evidence of osteomyelitis. Other: Subcutaneous edema is present about the ankle. No focal fluid collection. IMPRESSION: Subcutaneous edema about the ankle could be due to dependent change or cellulitis. Negative for osteomyelitis, abscess or septic joint. Chronic Achilles tendinopathy. Electronically Signed   By: Inge Rise M.D.   On: 07/18/2017 12:38    Positive ROS: All other systems have been reviewed and were otherwise negative  with the exception of those mentioned in the HPI and as above.  Physical Exam: General: Alert, no acute distress Cardiovascular: No pedal edema Respiratory: No cyanosis, no use of accessory musculature GI: No organomegaly, abdomen is soft and non-tender Skin: No lesions in the area of chief complaint Neurologic: Sensation intact distally Psychiatric: Patient is competent for consent with normal mood and affect Lymphatic: No axillary or cervical lymphadenopathy  MUSCULOSKELETAL: Examination of bilateral upper extremities reveals that she does have diffuse edema.  No significant pain with range of motion of the fingers, wrists, elbows, or shoulders.  Neurovascularly intact.  Examination of bilateral lower extremities reveals that she has generalized edema.  She does have cellulitis to the lower left leg that stops above the ankle.  This area is very tender to palpation.   She does have painless active and passive range of motion of the ankle, subtalar joint, and toes.  On the right side, she has cellulitis on the calf and over the Achilles tendon.  This area is tender to palpation.  No open wounds.  She has painless active and passive range of motion of the ankle, subtalar joint, and toes.  She does have palpable pedal pulses.  Assessment: MRSA bacteremia with unknown source. Bilateral lower extremity cellulitis.  There is no evidence of osteomyelitis, abscess, or septic arthritis by both clinical exam and MRI.  Plan: I discussed the findings with the patient and her husband.  She does have bilateral lower extremity cellulitis.  There is no evidence of deep involvement, and therefore no indication for surgery.  I recommend continued work-up to determine the source of her MRSA bloodstream infection.  Continue IV antibiotics per infectious disease to treat cellulitis.    Bertram Savin, MD Cell [phone]    07/18/2017 12:09 PM

## 2017-07-19 NOTE — Progress Notes (Addendum)
  Echocardiogram Echocardiogram Transesophageal has been performed.  Merrie Roof F 08/04/2017, 1:13 PM

## 2017-07-19 NOTE — Progress Notes (Addendum)
Pharmacy Antibiotic Note  Theresa Morrow is a 63 y.o. female admitted on 08/02/2017 with c/o ongoing, severe back pain, increased confusion. Hx of PNA several wks ago.  Pharmacy has been consulted for Vancomycin dosing for MRSA bacteremia.     Today, 07/18/2017: Day 4 of antibiotics SCr improved to 0.82; CrCl ~79 ml/min WBC 14.8 Afebrile Positive MRSA blood culture - ID now following  Plan: Adjust to Vancomycin to 750mg  IV q12h for estimated AUC 533. Check vancomycin levels at steady state. Daily Scr. Follow up renal fxn, culture results, and clinical course. Follow ID recommendations   Height: 5\' 7"  (170.2 cm) Weight: 182 lb (82.6 kg) IBW/kg (Calculated) : 61.6  Temp (24hrs), Avg:98.3 F (36.8 C), Min:98 F (36.7 C), Max:99.1 F (37.3 C)  Recent Labs  Lab 08/02/2017 1238 07/31/2017 1330 07/29/2017 1605 08/05/2017 2032 07/28/2017 2248 07/17/17 0443 07/18/17 0311 07/24/2017 0418 07/31/2017 1211  WBC 15.3*  --   --   --  23.7* 25.2* 28.4* 14.8*  --   CREATININE 2.56*  --   --  2.02* 1.83* 1.72* 1.36*  --  0.82  LATICACIDVEN  --  2.60* 2.06*  --  1.5  --   --   --   --     Estimated Creatinine Clearance: 78.6 mL/min (by C-G formula based on SCr of 0.82 mg/dL).    No Known Allergies  Antimicrobials this admission:  5/6 Vanc >>  5/6 Cefepime >> 5/7  Dose adjustments this admission:  5/7 Vancomycin dose adjusted to 1g q36h for improving renal function. 5/7 Vancomycin adjusted to 1250mg  q36h for improving renal function. 5/9 Vancomycin adjusted to 750 mg q12h for improving renal function.  Microbiology results:  5/6 BCx: 1/3 MRSA        5/7 BCID:  Staph aureus, methicillin resistant 5/6 UCx: ng 5/6 Strep pneumo: positive 5/6 Legionella: negative 5/6 Flu panel PCR: negative 5/6 Respiratory panel PCR: negative 5/6 MRSA PCR: negative 5/7 BCx: ngtd  Thank you for allowing pharmacy to be a part of this patient's care.  Danella Penton, Student Pharmacist 07/30/2017 12:58 PM  Patient  reviewed and agree with above plan.  Doreene Eland, PharmD, BCPS.   Pager: [phone] 08/08/2017 1:11 PM

## 2017-07-19 NOTE — Progress Notes (Signed)
INDICATIONS: Bacteremia, atrial fibrillation  PROCEDURE:   Informed consent was obtained prior to the procedure. The risks, benefits and alternatives for the procedure were discussed and the patient comprehended these risks.  Risks include, but are not limited to, cough, sore throat, vomiting, nausea, somnolence, esophageal and stomach trauma or perforation, bleeding, low blood pressure, aspiration, pneumonia, infection, trauma to the teeth and death.    After a procedural time-out, the oropharynx was anesthetized with 20% benzocaine spray.   During this procedure the patient was administered a total of Versed 3 3 mg and Fentanyl 125 mcg to achieve and maintain moderate conscious sedation.  The patient's heart rate, blood pressure, and oxygen saturationweare monitored continuously during the procedure. The period of conscious sedation was 20 minutes, of which I was present face-to-face 100% of this time.  The transesophageal probe was inserted in the esophagus and stomach without difficulty and multiple views were obtained.  The patient was kept under observation until the patient left the procedure room.  The patient left the procedure room in stable condition.   Agitated microbubble saline contrast was not administered.  COMPLICATIONS:    There were no immediate complications.  FINDINGS:  No evidence of endocarditis.  Mild mitral annular fibrosis trivial mitral insufficiency. Mild aortic insufficiency, central jet.  Mild-moderate central tricuspid insufficiency with normal pulmonary artery pressure normal left ventricular systolic function.  No evidence of left atrial thrombus.  Normal aorta without atherosclerosis.  RECOMMENDATIONS:    Proceed with cardioversion.  Try to identify alternative source of bacteremia.  Time Spent Directly with the Patient:  30 minutes   Dajon Rowe 08/02/2017, 11:12 AM

## 2017-07-20 DIAGNOSIS — R509 Fever, unspecified: Secondary | ICD-10-CM

## 2017-07-20 DIAGNOSIS — A491 Streptococcal infection, unspecified site: Secondary | ICD-10-CM

## 2017-07-20 DIAGNOSIS — R400 Somnolence: Secondary | ICD-10-CM

## 2017-07-20 DIAGNOSIS — R410 Disorientation, unspecified: Secondary | ICD-10-CM

## 2017-07-20 LAB — CBC
HCT: 32.3 % — ABNORMAL LOW (ref 36.0–46.0)
Hemoglobin: 10.4 g/dL — ABNORMAL LOW (ref 12.0–15.0)
MCH: 27.7 pg (ref 26.0–34.0)
MCHC: 32.2 g/dL (ref 30.0–36.0)
MCV: 85.9 fL (ref 78.0–100.0)
PLATELETS: 212 10*3/uL (ref 150–400)
RBC: 3.76 MIL/uL — AB (ref 3.87–5.11)
RDW: 16.5 % — AB (ref 11.5–15.5)
WBC: 14.2 10*3/uL — ABNORMAL HIGH (ref 4.0–10.5)

## 2017-07-20 LAB — HEPARIN LEVEL (UNFRACTIONATED)
HEPARIN UNFRACTIONATED: 0.21 [IU]/mL — AB (ref 0.30–0.70)
HEPARIN UNFRACTIONATED: 0.39 [IU]/mL (ref 0.30–0.70)
Heparin Unfractionated: 0.27 IU/mL — ABNORMAL LOW (ref 0.30–0.70)

## 2017-07-20 LAB — CREATININE, SERUM
CREATININE: 0.68 mg/dL (ref 0.44–1.00)
GFR calc non Af Amer: >60 mL/min (ref >60)

## 2017-07-20 MED ORDER — HEPARIN (PORCINE) IN NACL 100-0.45 UNIT/ML-% IJ SOLN
2100.0000 [IU]/h | INTRAMUSCULAR | Status: DC
  Filled 2017-07-20: qty 250

## 2017-07-20 MED ORDER — HEPARIN (PORCINE) IN NACL 100-0.45 UNIT/ML-% IJ SOLN
2300.0000 [IU]/h | INTRAMUSCULAR | Status: DC
  Administered 2017-07-20 – 2017-07-21 (×2): 2300 [IU]/h via INTRAVENOUS
  Filled 2017-07-20 (×2): qty 250

## 2017-07-20 MED ORDER — METRONIDAZOLE 500 MG PO TABS
500.0000 mg | ORAL_TABLET | Freq: Three times a day (TID) | ORAL | Status: DC
  Administered 2017-07-20 – 2017-07-21 (×3): 500 mg via ORAL
  Filled 2017-07-20 (×3): qty 1

## 2017-07-20 MED ORDER — MORPHINE SULFATE (PF) 4 MG/ML IV SOLN
1.0000 mg | INTRAVENOUS | Status: DC | PRN
  Administered 2017-07-20 – 2017-07-21 (×3): 1 mg via INTRAVENOUS
  Filled 2017-07-20 (×3): qty 1

## 2017-07-20 NOTE — Progress Notes (Addendum)
Progress Note  Patient Name: Theresa Morrow Date of Encounter: 07/20/2017  Primary Cardiologist: No primary care provider on file.   Subjective   Interval development is blood cultures positive for Staphylococcus aureus, so far only 1 of the 4 cultures drawn on May 6. MRSA by PCR was negative  Resting comfortably, more awake than previous, may try to get OOB tomorrow. Legs less tender.   Inpatient Medications    Scheduled Meds: . aspirin  325 mg Oral Daily  . mouth rinse  15 mL Mouth Rinse BID  . methocarbamol  500 mg Oral BID  . pantoprazole  40 mg Oral Daily   Continuous Infusions: . sodium chloride 20 mL/hr at 07/20/17 0600  . sodium chloride Stopped (07/26/2017 2150)  . heparin 2,100 Units/hr (07/20/17 1200)  . vancomycin Stopped (07/20/17 1039)   PRN Meds: acetaminophen, morphine injection   Vital Signs    Vitals:   07/20/17 0600 07/20/17 0700 07/20/17 0800 07/20/17 0900  BP: (!) 170/91 (!) 167/89  (!) 150/80  Pulse:  100  (!) 102  Resp: 17 14  12   Temp:   (!) 101 F (38.3 C)   TempSrc:   Oral   SpO2:  99%  98%  Weight:      Height:        Intake/Output Summary (Last 24 hours) at 07/20/2017 1247 Last data filed at 07/20/2017 1200 Gross per 24 hour  Intake 1702.1 ml  Output 2100 ml  Net -397.9 ml   Filed Weights   07/31/2017 1234  Weight: 182 lb (82.6 kg)    Telemetry    Atrial fibrillation with ventricular rates in the 100-110 range>>SR - Personally Reviewed  ECG    05/09 SR, HR 87, PAC seen- Personally Reviewed  Physical Exam   GEN: No acute distress. More animation in her face than previously, talks more  Neck: No JVD Cardiac: RRR, short benign murmur, no rubs, or gallops.  Respiratory: Clear to auscultation bilaterally. GI: Soft, nontender, non-distended  MS: 1-2+ LE edema; No deformity. Neuro:  Nonfocal  Psych: Normal affect   Labs    Chemistry Recent Labs  Lab 08/01/2017 1238  08/09/2017 2248  07/18/17 0311 07/25/2017 1211  07/20/17 0258  NA 138  --  138  --  138  --   --   K 4.0  --  3.8  --  4.3  --   --   CL 104  --  111  --  111  --   --   CO2 18*  --  17*  --  17*  --   --   GLUCOSE 72  --  148*  --  129*  --   --   BUN 63*  --  56*  --  53*  --   --   CREATININE 2.56*   < > 1.83*   < > 1.36* 0.82 0.68  CALCIUM 8.5*  --  7.7*  --  7.8*  --   --   PROT 6.2*  --  5.1*  --  5.4*  --   --   ALBUMIN 2.7*  --  2.1*  --  2.1*  --   --   AST 72*  --  49*  --  30  --   --   ALT 39  --  32  --  26  --   --   ALKPHOS 161*  --  129*  --  153*  --   --  BILITOT 1.2  --  1.1  --  0.8  --   --   GFRNONAA 19*   < > 28*   < > 41* >60 >60  GFRAA 22*   < > 33*   < > 47* >60 >60  ANIONGAP 16*  --  10  --  10  --   --    < > = values in this interval not displayed.     Hematology Recent Labs  Lab 07/18/17 0311 07/15/2017 0418 07/20/17 0258  WBC 28.4* 14.8* 14.2*  RBC 3.85* 3.60* 3.76*  HGB 10.8* 10.1* 10.4*  HCT 32.6* 30.5* 32.3*  MCV 84.7 84.7 85.9  MCH 28.1 28.1 27.7  MCHC 33.1 33.1 32.2  RDW 16.7* 16.4* 16.5*  PLT 144* 156 212    Cardiac Enzymes Recent Labs  Lab 08/02/2017 1733 07/23/2017 2248 07/17/17 0443  TROPONINI <0.03 0.03* 0.03*      DDimer  Recent Labs  Lab 08/09/2017 1748  DDIMER 7.82*     Radiology    Dg Chest Port 1v Same Day  Result Date: 07/27/2017 CLINICAL DATA:  MRSA bacteremia and septic shock.  Unknown source. EXAM: PORTABLE CHEST 1 VIEW COMPARISON:  07/12/2017 CXR FINDINGS: Heart and mediastinal contours are stable with slightly uncoiled appearance of the nonaneurysmal thoracic aorta. Slight increase in left basilar atelectasis since prior. No alveolar consolidation or effusion. No pneumothorax. No acute osseous abnormality. IMPRESSION: Slight increase in left basilar atelectasis. Otherwise, no significant change is identified. Electronically Signed   By: Ashley Royalty M.D.   On: 07/29/2017 20:09    Cardiac Studies   Echo Jul 17, 2017 - Left ventricle: The cavity size was  normal. Wall thickness was   normal. Systolic function was vigorous. The estimated ejection   fraction was in the range of 65% to 70%. Wall motion was normal;   there were no regional wall motion abnormalities. - Aortic valve: There was mild regurgitation. - Mitral valve: There was mild regurgitation. - Left atrium: The atrium was mildly dilated. - Pulmonary arteries: PA peak pressure: 31 mm Hg (S). Impressions: - Vigorous LV systolic function; mild AI and MR; mild LAE; mild TR.  TEE: 08/08/2017 - Left ventricle: The cavity size was normal. Wall thickness was   normal. Systolic function was normal. The estimated ejection   fraction was in the range of 60% to 65%. Wall motion was normal;   there were no regional wall motion abnormalities. - Aortic valve: There was mild regurgitation. - Left atrium: No evidence of thrombus in the atrial cavity or   appendage. The appendage was morphologically a left appendage,   multilobulated, and of normal size. Emptying velocity was mildly   reduced. - Right atrium: No evidence of thrombus in the atrial cavity or   appendage. - Atrial septum: No defect or patent foramen ovale was identified. - Tricuspid valve: There was mild-moderate regurgitation directed   centrally. - Pulmonary arteries: PA peak pressure: 31 mm Hg (S). Impressions: - No evidence of endocarditis. There was no evidence of a   vegetation.  Patient Profile     62 y.o. female presenting with sepsis, staph bacteremia, new onset atrial fibrillation with rapid ventricular response that has now persisted for well over 24 hours.  Assessment & Plan    1. AFib: She does have mild left atrial enlargement on echo, but no other abnormalities of note and no chronic cardiac risk factors.  - Suspect that the arrhythmia was related to the acute illness.  -  TEE/DCCV was successful - She is likely to maintain normal rhythm after cardioversion at this point as her general medical condition is  improving  - Would keep her on heparin for the remainder of her hospital stay. - If there is no recurrence of Atrial fib by d/c, do not feel anticoagulation is needed. - CHA2DS2-VASc = 1 (female)  - If has recurrence of atrial fib, would start DOAC - f/u with Dr Sallyanne Kuster 05/23  2. S.aureus bacteremia:  - TEE 05/09 showed no vegetations - per IM  3. Sepsis:  Resolved  4. AKI: Continues to improve - recheck BMET in am  For questions or updates, please contact [place] Please consult [email or web address] for contact info under Cardiology/STEMI.      Signed, Rosaria Ferries, PA-C  07/20/2017, 12:47 PM   I have seen and examined the patient along with Rosaria Ferries, PA-C .  I have reviewed the chart, notes and new data.  I agree with PA's note.  Key new complaints: She looks and feels better today.  She is more alert and interactive. Key examination changes: Maintaining normal sinus rhythm since cardioversion, no signs of congestive heart failure, erythema on lower extremities seems a little better. Key new findings / data: TEE did not show findings of endocarditis, to date, only 1 of the 4 blood cultures drawn on May 6 grew MRSA.  Both cultures from a 7 are so far negative.  PLAN: I do not think she has endocarditis.   She had atrial fibrillation associated with critical illness/sepsis and does not have any meaningful structural heart disease.  I think she is at low risk of recurrence of atrial fibrillation, especially once her acute illness resolves.  He is also had low embolic risk without any major risk factors except for female gender. I would continue intravenous heparin for the remainder of her hospitalization.  If there is no recurrent atrial fibrillation by the time of discharge, I would not start oral anticoagulants.  If atrial fibrillation recurs, please reconsult cardiology. We will sign off for now.  Sanda Klein, MD, [place] [phone] 07/20/2017, 1:42  PM

## 2017-07-20 NOTE — Progress Notes (Signed)
Pts family educated regarding contact precautions and protocol for wearing a gown and gloves while visitng patient.  Pts family does not want to wear gown or gloves but will wash hands when entering and exiting room.  Charge nurse notified.

## 2017-07-20 NOTE — Progress Notes (Signed)
Subjective:  Pt very somnolent during exam   Antibiotics:  Anti-infectives (From admission, onward)   Start     Dose/Rate Route Frequency Ordered Stop   07/20/17 1700  metroNIDAZOLE (FLAGYL) tablet 500 mg     500 mg Oral Every 8 hours 07/20/17 1649     07/28/2017 2200  vancomycin (VANCOCIN) IVPB 750 mg/150 ml premix     750 mg 150 mL/hr over 60 Minutes Intravenous Every 12 hours 07/12/2017 1308     07/18/17 2000  vancomycin (VANCOCIN) IVPB 1000 mg/200 mL premix  Status:  Discontinued     1,000 mg 200 mL/hr over 60 Minutes Intravenous Every 48 hours 08/08/2017 1931 07/17/17 1154   07/18/17 1200  vancomycin (VANCOCIN) 1,250 mg in sodium chloride 0.9 % 250 mL IVPB  Status:  Discontinued     1,250 mg 166.7 mL/hr over 90 Minutes Intravenous Every 36 hours 07/18/17 0728 08/07/2017 1308   07/17/17 1400  ceFEPIme (MAXIPIME) 1 g in sodium chloride 0.9 % 100 mL IVPB  Status:  Discontinued     1 g 200 mL/hr over 30 Minutes Intravenous Every 24 hours 07/26/2017 1931 07/17/17 0901   07/17/17 1200  vancomycin (VANCOCIN) IVPB 1000 mg/200 mL premix  Status:  Discontinued     1,000 mg 200 mL/hr over 60 Minutes Intravenous Every 36 hours 07/17/17 1154 07/18/17 0728   07/17/17 1000  ceFEPIme (MAXIPIME) 1 g in sodium chloride 0.9 % 100 mL IVPB  Status:  Discontinued     1 g 200 mL/hr over 30 Minutes Intravenous Every 12 hours 07/17/17 0901 07/17/17 1149   08/09/2017 2000  vancomycin (VANCOCIN) 500 mg in sodium chloride 0.9 % 100 mL IVPB     500 mg 100 mL/hr over 60 Minutes Intravenous  Once 08/02/2017 1931 07/20/2017 2220   08/05/2017 1900  cefTRIAXone (ROCEPHIN) 1 g in sodium chloride 0.9 % 100 mL IVPB  Status:  Discontinued     1 g 200 mL/hr over 30 Minutes Intravenous Every 24 hours 07/13/2017 1849 07/20/2017 1901   07/29/2017 1900  azithromycin (ZITHROMAX) 500 mg in sodium chloride 0.9 % 250 mL IVPB  Status:  Discontinued     500 mg 250 mL/hr over 60 Minutes Intravenous Every 24 hours 07/29/2017 1849  07/23/2017 1901   07/12/2017 1300  piperacillin-tazobactam (ZOSYN) IVPB 3.375 g  Status:  Discontinued     3.375 g 100 mL/hr over 30 Minutes Intravenous  Once 07/13/2017 1245 07/20/2017 1257   07/14/2017 1300  vancomycin (VANCOCIN) IVPB 1000 mg/200 mL premix     1,000 mg 200 mL/hr over 60 Minutes Intravenous  Once 08/06/2017 1245 07/29/2017 1424   07/12/2017 1300  ceFEPIme (MAXIPIME) 2 g in sodium chloride 0.9 % 100 mL IVPB     2 g 200 mL/hr over 30 Minutes Intravenous  Once 07/14/2017 1300 08/07/2017 1346      Medications: Scheduled Meds: . aspirin  325 mg Oral Daily  . mouth rinse  15 mL Mouth Rinse BID  . methocarbamol  500 mg Oral BID  . metroNIDAZOLE  500 mg Oral Q8H  . pantoprazole  40 mg Oral Daily   Continuous Infusions: . sodium chloride 20 mL/hr at 07/20/17 0600  . sodium chloride Stopped (07/22/2017 2150)  . heparin 2,300 Units/hr (07/20/17 1531)  . vancomycin Stopped (07/20/17 1039)   PRN Meds:.acetaminophen, morphine injection    Objective: Weight change:   Intake/Output Summary (Last 24 hours) at 07/20/2017 1650 Last data filed at 07/20/2017 1500  Gross per 24 hour  Intake 1563.1 ml  Output 1600 ml  Net -36.9 ml   Blood pressure (!) 150/80, pulse (!) 102, temperature (!) 101 F (38.3 C), temperature source Oral, resp. rate 12, height 5\' 7"  (1.702 m), weight 182 lb (82.6 kg), SpO2 98 %. Temp:  [98.3 F (36.8 C)-101 F (38.3 C)] 101 F (38.3 C) (05/10 0800) Pulse Rate:  [92-102] 102 (05/10 0900) Resp:  [12-17] 12 (05/10 0900) BP: (126-170)/(68-91) 150/80 (05/10 0900) SpO2:  [95 %-99 %] 98 % (05/10 0900)  Physical Exam: General: somnolent and difficult to arouse but did so later when I yelled at her and she recognized her husband. Prior to that she was sleeping while blood cultures were being drawn  CVS tachy rate regula no murmur rubs or gallops Chest: clear to auscultation bilaterally,anteriorly Abdomen: soft  nondistended,   Extremities/Skin:  07/20/17:      Legs  07/22/2017:       Legs still edematous and erythema 07/18/17:      Neuro: nonfocal  CBC:  CBC Latest Ref Rng & Units 07/20/2017 07/18/2017 07/18/2017  WBC 4.0 - 10.5 K/uL 14.2(H) 14.8(H) 28.4(H)  Hemoglobin 12.0 - 15.0 g/dL 10.4(L) 10.1(L) 10.8(L)  Hematocrit 36.0 - 46.0 % 32.3(L) 30.5(L) 32.6(L)  Platelets 150 - 400 K/uL 212 156 144(L)      BMET Recent Labs    07/18/17 0311 07/20/2017 1211 07/20/17 0258  NA 138  --   --   K 4.3  --   --   CL 111  --   --   CO2 17*  --   --   GLUCOSE 129*  --   --   BUN 53*  --   --   CREATININE 1.36* 0.82 0.68  CALCIUM 7.8*  --   --      Liver Panel  Recent Labs    07/18/17 0311  PROT 5.4*  ALBUMIN 2.1*  AST 30  ALT 26  ALKPHOS 153*  BILITOT 0.8       Sedimentation Rate No results for input(s): ESRSEDRATE in the last 72 hours. C-Reactive Protein No results for input(s): CRP in the last 72 hours.  Micro Results: Recent Results (from the past 720 hour(s))  Blood Culture (routine x 2)     Status: Abnormal   Collection Time: 07/31/2017  1:26 PM  Result Value Ref Range Status   Specimen Description   Final    RIGHT ANTECUBITAL Performed at [facility] [address] [place], [place] [address]    Special Requests   Final    BOTTLES DRAWN AEROBIC AND ANAEROBIC Blood Culture adequate volume Performed at [facility] [address] [place], [place] [address]    Culture  Setup Time   Final    GRAM POSITIVE COCCI IN BOTH AEROBIC AND ANAEROBIC BOTTLES CRITICAL RESULT CALLED TO, READ BACK BY AND VERIFIED WITH: Chales Abrahams PharmD 11:10 07/17/17 ([email or web address]) Performed at [facility], [place] [address], [place],  [address]    Culture METHICILLIN RESISTANT STAPHYLOCOCCUS AUREUS (A)  Final   Report Status 07/17/2017 FINAL  Final   Organism ID, Bacteria METHICILLIN RESISTANT STAPHYLOCOCCUS AUREUS  Final      Susceptibility   Methicillin resistant staphylococcus aureus - MIC*     CIPROFLOXACIN >=8 RESISTANT Resistant     ERYTHROMYCIN >=8 RESISTANT Resistant     GENTAMICIN <=0.5 SENSITIVE Sensitive     OXACILLIN >=4 RESISTANT Resistant     TETRACYCLINE <=1  SENSITIVE Sensitive     VANCOMYCIN 1 SENSITIVE Sensitive     TRIMETH/SULFA <=10 SENSITIVE Sensitive     CLINDAMYCIN <=0.25 SENSITIVE Sensitive     RIFAMPIN <=0.5 SENSITIVE Sensitive     Inducible Clindamycin NEGATIVE Sensitive     * METHICILLIN RESISTANT STAPHYLOCOCCUS AUREUS  Blood Culture (routine x 2)     Status: None (Preliminary result)   Collection Time: 07/14/2017  1:26 PM  Result Value Ref Range Status   Specimen Description   Final    LEFT ANTECUBITAL Performed at [facility] [address], [place], [place] [address]    Special Requests   Final    BOTTLES DRAWN AEROBIC AND ANAEROBIC Blood Culture results may not be optimal due to an excessive volume of blood received in culture bottles Performed at [facility] [address] [place], [place] [address]    Culture   Final    NO GROWTH 4 DAYS Performed at [facility], [place] [address], [place], [place] [address]    Report Status PENDING  Incomplete  Urine culture     Status: None   Collection Time: 08/04/2017  1:26 PM  Result Value Ref Range Status   Specimen Description   Final    URINE, CLEAN CATCH Performed at [facility], [place] [address] [place], [place] [address]    Special Requests   Final    Normal Performed at [facility], [place] [address], [place], [place] [address]    Culture   Final    NO GROWTH Performed at [facility], [place] [address] [place],  [address]    Report Status 07/18/2017 FINAL  Final  Blood Culture ID Panel (Reflexed)     Status: Abnormal   Collection Time: 08/03/2017  1:26 PM  Result Value Ref Range Status   Enterococcus species NOT DETECTED NOT DETECTED Final   Listeria monocytogenes NOT DETECTED NOT DETECTED Final    Staphylococcus species DETECTED (A) NOT DETECTED Final    Comment: CRITICAL RESULT CALLED TO, READ BACK BY AND VERIFIED WITH: Chales Abrahams PharmD 11:10 07/17/17 ([email or web address])    Staphylococcus aureus DETECTED (A) NOT DETECTED Final    Comment: Methicillin (oxacillin)-resistant Staphylococcus aureus (MRSA). MRSA is predictably resistant to beta-lactam antibiotics (except ceftaroline). Preferred therapy is vancomycin unless clinically contraindicated. Patient requires contact precautions if  hospitalized. CRITICAL RESULT CALLED TO, READ BACK BY AND VERIFIED WITH: Chales Abrahams PharmD 11:10 07/17/17 ([email or web address])    Methicillin resistance DETECTED (A) NOT DETECTED Final    Comment: CRITICAL RESULT CALLED TO, READ BACK BY AND VERIFIED WITH: Chales Abrahams PharmD 11:10 07/17/17 ([email or web address])    Streptococcus species NOT DETECTED NOT DETECTED Final   Streptococcus agalactiae NOT DETECTED NOT DETECTED Final   Streptococcus pneumoniae NOT DETECTED NOT DETECTED Final   Streptococcus pyogenes NOT DETECTED NOT DETECTED Final   Acinetobacter baumannii NOT DETECTED NOT DETECTED Final   Enterobacteriaceae species NOT DETECTED NOT DETECTED Final   Enterobacter cloacae complex NOT DETECTED NOT DETECTED Final   Escherichia coli NOT DETECTED NOT DETECTED Final   Klebsiella oxytoca NOT DETECTED NOT DETECTED Final   Klebsiella pneumoniae NOT DETECTED NOT DETECTED Final   Proteus species NOT DETECTED NOT DETECTED Final   Serratia marcescens NOT DETECTED NOT DETECTED Final   Haemophilus influenzae NOT DETECTED NOT DETECTED Final   Neisseria meningitidis NOT DETECTED NOT DETECTED Final   Pseudomonas aeruginosa NOT DETECTED NOT DETECTED Final   Candida albicans NOT DETECTED NOT  DETECTED Final   Candida glabrata NOT DETECTED NOT DETECTED Final   Candida krusei NOT DETECTED NOT DETECTED Final   Candida parapsilosis NOT DETECTED NOT DETECTED Final   Candida tropicalis NOT DETECTED NOT DETECTED Final    Comment: Performed at [facility], [place] [address], [place], [place] [address]  Culture, blood (routine x 2) Call MD if unable to obtain prior to antibiotics being given     Status: None (Preliminary result)   Collection Time: 07/27/2017  8:35 PM  Result Value Ref Range Status   Specimen Description   Final    BLOOD LEFT ANTECUBITAL Performed at [facility] [address] [place], [place] [address]    Special Requests   Final    BOTTLES DRAWN AEROBIC AND ANAEROBIC Blood Culture adequate volume Performed at [facility] [address] [place], [place] [address]    Culture   Final    NO GROWTH 4 DAYS Performed at [facility], [place] [address], [place], [place] [address]    Report Status PENDING  Incomplete  Culture, blood (routine x 2) Call MD if unable to obtain prior to antibiotics being given     Status: None (Preliminary result)   Collection Time: 07/24/2017  9:00 PM  Result Value Ref Range Status   Specimen Description   Final    BLOOD LEFT ARM Performed at [facility] [address], [place], [place] [address]    Special Requests   Final    BOTTLES DRAWN AEROBIC AND ANAEROBIC Blood Culture adequate volume Performed at [facility] [address], [place], [place] [address]    Culture   Final    NO GROWTH 4 DAYS Performed at [facility], [place] [address], [place], [place] [address]    Report Status PENDING  Incomplete  Respiratory Panel by PCR     Status: None   Collection Time: 07/26/2017  9:42 PM  Result Value Ref Range Status   Adenovirus NOT DETECTED NOT DETECTED Final   Coronavirus 229E NOT DETECTED NOT DETECTED Final   Coronavirus HKU1 NOT DETECTED NOT DETECTED Final   Coronavirus NL63 NOT DETECTED NOT DETECTED Final   Coronavirus OC43 NOT DETECTED NOT DETECTED Final   Metapneumovirus NOT DETECTED NOT DETECTED Final   Rhinovirus / Enterovirus NOT DETECTED NOT DETECTED Final   Influenza A NOT DETECTED NOT DETECTED  Final   Influenza B NOT DETECTED NOT DETECTED Final   Parainfluenza Virus 1 NOT DETECTED NOT DETECTED Final   Parainfluenza Virus 2 NOT DETECTED NOT DETECTED Final   Parainfluenza Virus 3 NOT DETECTED NOT DETECTED Final   Parainfluenza Virus 4 NOT DETECTED NOT DETECTED Final   Respiratory Syncytial Virus NOT DETECTED NOT DETECTED Final   Bordetella pertussis NOT DETECTED NOT DETECTED Final   Chlamydophila pneumoniae NOT DETECTED NOT DETECTED Final   Mycoplasma pneumoniae NOT DETECTED NOT DETECTED Final    Comment: Performed at [facility], [place] [address] [place], [place] [address]  MRSA PCR Screening     Status: None   Collection Time: 07/24/2017  9:43 PM  Result Value Ref Range Status   MRSA by PCR NEGATIVE NEGATIVE Final    Comment:        The GeneXpert MRSA Assay (FDA approved for NASAL specimens only), is one component of a comprehensive MRSA colonization surveillance program. It is not intended to diagnose MRSA infection nor to guide or monitor treatment for  MRSA infections. Performed at [facility], [place] [address] [place], [place] [address]   Culture, blood (Routine X 2) w Reflex to ID Panel     Status: None (Preliminary result)   Collection Time: 07/17/17 12:06 PM  Result Value Ref Range Status   Specimen Description   Final    BLOOD RIGHT HAND Performed at [facility] [address] [place], [place] [address]    Special Requests   Final    BOTTLES DRAWN AEROBIC AND ANAEROBIC Blood Culture adequate volume Performed at [facility] [address] [place], [place] [address]    Culture   Final    NO GROWTH 3 DAYS Performed at [facility], [place] [address], [place], [place] [address]    Report Status PENDING  Incomplete  Culture, blood (Routine X 2) w Reflex to ID Panel     Status: None (Preliminary result)   Collection Time: 07/17/17 12:07 PM  Result Value Ref Range Status   Specimen  Description   Final    BLOOD LEFT HAND Performed at [facility] [address] [place], [place] [address]    Special Requests   Final    BOTTLES DRAWN AEROBIC ONLY Blood Culture adequate volume Performed at [facility] [address] [place] [address], [place], [place] [address]    Culture   Final    NO GROWTH 3 DAYS Performed at [facility], [place] [address] [place], [place] [address]    Report Status PENDING  Incomplete    Studies/Results: Dg Chest Port 1v Same Day  Result Date: 07/11/2017 CLINICAL DATA:  MRSA bacteremia and septic shock.  Unknown source. EXAM: PORTABLE CHEST 1 VIEW COMPARISON:  07/15/2017 CXR FINDINGS: Heart and mediastinal contours are stable with slightly uncoiled appearance of the nonaneurysmal thoracic aorta. Slight increase in left basilar atelectasis since prior. No alveolar consolidation or effusion. No pneumothorax. No acute osseous abnormality. IMPRESSION: Slight increase in left basilar atelectasis. Otherwise, no significant change is identified. Electronically Signed   By: Ashley Royalty M.D.   On: 08/06/2017 20:09      Assessment/Plan:  INTERVAL HISTORY   Fevers post TEE Confusion  Principal Problem:   ARF (acute renal failure) (HCC) Active Problems:   Atrial fibrillation with RVR (HCC)   Sepsis (HCC)   UTI (urinary tract infection)   CAP (community acquired pneumonia)   AKI (acute kidney injury) ([facility])   Lactic acidosis   Lumbar back pain   MRSA bacteremia    ALVINE MOSTAFA is a 62 y.o. female with  MRSA bacteremia and septic shock. Her source is unclear. She also has pneumococcal + ag in urine.   #1 MRSA bacteremia:     [facility] Staphylococcus aureus bacteremia   Staphylococcus aureus bacteremia (SAB) is associated with a high rate of complications and mortality.  Specific aspects of clinical management are critical to optimizing the outcome of patients with SAB.  Therefore,  the [facility] Antimicrobial Management Team [facility]) has initiated an intervention aimed at improving the management of SAB at [facility].  To do so, Infectious Diseases physicians are providing an evidence-based consult for the management of all patients with SAB.     Yes No Comments  Perform follow-up blood cultures (even if the patient is afebrile) to ensure clearance of bacteremia [x]  []  I repeated blood cultures again due to fevers  Remove vascular catheter and obtain follow-up blood cultures after  the removal of the catheter [x]  []  DO NOT PLACE CENTRAL INE UNTIL WE HAVE NO GROWTH AT 5 days  Perform echocardiography to evaluate for endocarditis (transthoracic ECHO is 40-50% sensitive, TEE is > 90% sensitive) [x]  []  Please keep in mind, that neither test can definitively EXCLUDE endocarditis, and that should clinical suspicion remain high for endocarditis the patient should then still be treated with an "endocarditis" duration of therapy = 6 weeks TEE negative  Consult electrophysiologist to evaluate implanted cardiac device (pacemaker, ICD) []  []    Ensure source control [x]  []  Have all abscesses been drained effectively? Have deep seeded infections (septic joints or osteomyelitis) had appropriate surgical debridement?  Investigate for "metastatic" sites of infection [x]  []  Does the patient have ANY symptom or physical exam finding that would suggest a deeper infection (back or neck pain that may be suggestive of vertebral osteomyelitis or epidural abscess, muscle pain that could be a symptom of pyomyositis)?  Keep in mind that for deep seeded infections MRI imaging with contrast is preferred rather than other often insensitive tests such as plain x-rays, especially early in a patient's presentation.  Change antibiotic therapy to vancomycin []  []  Beta-lactam antibiotics are preferred for MSSA due to higher cure rates.   If on Vancomycin, goal trough should be 15 - 20 mcg/mL  Estimated duration of  IV antibiotic therapy:  4\weeks since no removable source identified []  []  Consult case management for probably prolonged outpatient IV antibiotic therapy   #2 Pneumococcus ag in  urine: she is covered with vancomycin  #3 Fevers: wonder is she aspirated post TEE. I am adding metronidazole for better anerobic coverage  #4 Confusion; apparently she did get dose of morphine this AM. If her somnolence does not improve with WD of opiates would ask Neuro to see her esp with the recent visual hallucinations.  I will ask Dr. Megan Salon to check in on her this weekend.    LOS: 4 days   Alcide Evener 07/20/2017, 4:50 PM

## 2017-07-20 NOTE — Progress Notes (Signed)
Theresa Morrow paged to clarify Heparin order.  Ok to continue drip until morning.  Will reassess then.  No s/s of bleeding anywhere.

## 2017-07-20 NOTE — Progress Notes (Signed)
Patient ID: Theresa Morrow, female   DOB: 04/11/1955, 62 y.o.   MRN: [id number]                                                                PROGRESS NOTE                                                                                                                                                                                                             Patient Demographics:    Theresa Morrow, is a 62 y.o. female, DOB - 1955/04/19, MCN:[id number]  Admit date - 07/14/2017   Admitting Physician Jani Gravel, MD  Outpatient Primary MD for the patient is [place], [place] [facility]  Outpatient Specialists:     Chief Complaint  Patient presents with  . Hypotension  . Altered Mental Status  . Hallucinations       Brief Narrative  62 y.o. female, w brain tumor in 2007, chronic headache, gerd,  c/o lower back without radiation.  Denies numbness, tingling, weakness, incontinence.  Started Tuesday ( 4/30.2019) while at work, no hx of trauma.  Pt couldn't walk more than 100 feet. Presented to ED on 5/1 and had xray.  5/4 had CT scan L spine.  Pt was told to go to ED due to low bp and altered mental status. Glucose was low initially at 58    Subjective:    Theresa Morrow has no new complaint reported.   Assessment  & Plan :    Principal Problem:   ARF (acute renal failure) (HCC) Active Problems:   Atrial fibrillation with RVR (HCC)   Sepsis (HCC)   UTI (urinary tract infection)   CAP (community acquired pneumonia)   AKI (acute kidney injury) ([place])   Lactic acidosis   Lumbar back pain   MRSA bacteremia     Sepsis (tachy, hypotension, elevation in LA) ddx HCAP vs UTI - Pt with S aureus bacteremia. ID on board and assisting with work up. Antibiotics per ID.  - TEE negative for source or vegetation - Ortho consulted and no joint aspiration recommended  Hypotension improved with Fluids - troponin with flat trend  Afib with RVR - Cardiology on board  currently and managing. Pt is s/p cardioversion - anticoagulated on Heparin  ARF - improved with rehydration, as such suspecting prerenal etiology  Hypoglycemia - resolved as such will d/c blood glucose checks.  Back pain - MRI L spine  Gerd - Cont PPI   Code Status : FULL CODE  Family Communication  : w patient  Disposition Plan  : home  Barriers For Discharge : afib with rvr requiring IV medication  Consults  :  cardiology  Procedures  : TEE pending  DVT Prophylaxis  :  Pt on heparin  Lab Results  Component Value Date   PLT 212 07/20/2017    Antibiotics  :  Vancomycin  Anti-infectives (From admission, onward)   Start     Dose/Rate Route Frequency Ordered Stop   07/12/2017 2200  vancomycin (VANCOCIN) IVPB 750 mg/150 ml premix     750 mg 150 mL/hr over 60 Minutes Intravenous Every 12 hours 07/20/2017 1308     07/18/17 2000  vancomycin (VANCOCIN) IVPB 1000 mg/200 mL premix  Status:  Discontinued     1,000 mg 200 mL/hr over 60 Minutes Intravenous Every 48 hours 07/15/2017 1931 07/17/17 1154   07/18/17 1200  vancomycin (VANCOCIN) 1,250 mg in sodium chloride 0.9 % 250 mL IVPB  Status:  Discontinued     1,250 mg 166.7 mL/hr over 90 Minutes Intravenous Every 36 hours 07/18/17 0728 07/14/2017 1308   07/17/17 1400  ceFEPIme (MAXIPIME) 1 g in sodium chloride 0.9 % 100 mL IVPB  Status:  Discontinued     1 g 200 mL/hr over 30 Minutes Intravenous Every 24 hours 07/15/2017 1931 07/17/17 0901   07/17/17 1200  vancomycin (VANCOCIN) IVPB 1000 mg/200 mL premix  Status:  Discontinued     1,000 mg 200 mL/hr over 60 Minutes Intravenous Every 36 hours 07/17/17 1154 07/18/17 0728   07/17/17 1000  ceFEPIme (MAXIPIME) 1 g in sodium chloride 0.9 % 100 mL IVPB  Status:  Discontinued     1 g 200 mL/hr over 30 Minutes Intravenous Every 12 hours 07/17/17 0901 07/17/17 1149   08/10/2017 2000  vancomycin (VANCOCIN) 500 mg in sodium chloride 0.9 % 100 mL IVPB     500 mg 100 mL/hr over 60  Minutes Intravenous  Once 08/10/2017 1931 07/27/2017 2220   07/11/2017 1900  cefTRIAXone (ROCEPHIN) 1 g in sodium chloride 0.9 % 100 mL IVPB  Status:  Discontinued     1 g 200 mL/hr over 30 Minutes Intravenous Every 24 hours 08/09/2017 1849 07/24/2017 1901   08/08/2017 1900  azithromycin (ZITHROMAX) 500 mg in sodium chloride 0.9 % 250 mL IVPB  Status:  Discontinued     500 mg 250 mL/hr over 60 Minutes Intravenous Every 24 hours 08/06/2017 1849 07/13/2017 1901   08/06/2017 1300  piperacillin-tazobactam (ZOSYN) IVPB 3.375 g  Status:  Discontinued     3.375 g 100 mL/hr over 30 Minutes Intravenous  Once 07/24/2017 1245 07/15/2017 1257   08/08/2017 1300  vancomycin (VANCOCIN) IVPB 1000 mg/200 mL premix     1,000 mg 200 mL/hr over 60 Minutes Intravenous  Once 07/28/2017 1245 07/30/2017 1424   07/29/2017 1300  ceFEPIme (MAXIPIME) 2 g in sodium chloride 0.9 % 100 mL IVPB     2 g 200 mL/hr over 30 Minutes Intravenous  Once 07/28/2017 1300 07/18/2017 1346        Objective:   Vitals:   07/20/17 0600 07/20/17 0700 07/20/17 0800 07/20/17 0900  BP: (!) 170/91 (!) 167/89  (!) 150/80  Pulse:  100  (!) 102  Resp: 17 14  12  Temp:   (!) 101 F (38.3 C)   TempSrc:   Oral   SpO2:  99%  98%  Weight:      Height:        Wt Readings from Last 3 Encounters:  07/23/2017 82.6 kg (182 lb)  05/18/16 86.4 kg (190 lb 6 oz)  12/23/15 83.5 kg (184 lb)     Intake/Output Summary (Last 24 hours) at 07/20/2017 0953 Last data filed at 07/20/2017 0939 Gross per 24 hour  Intake 1637.6 ml  Output 2600 ml  Net -962.4 ml     Physical Exam  GEN: Awake Alert, Oriented X 3, No new F.N deficits, Normal affect [place].AT,PERRAL Supple Neck,No JVD, No cervical lymphadenopathy appriciated.  Pulm: equal chest rise, Good air movement bilaterally, CTAB Irr, irr, s1, s2,  Abdomen: + B.Sounds, Abd Soft, No tenderness, No organomegaly appriciated, No rebound - guarding or rigidity. SKIN: No Cyanosis, Clubbing or edema, No new Rash or bruise   No janeway,  no osler     Data Review:    CBC Recent Labs  Lab 07/14/17 1940  07/15/2017 2248 07/17/17 0443 07/18/17 0311 07/30/2017 0418 07/20/17 0258  WBC 2.2*   < > 23.7* 25.2* 28.4* 14.8* 14.2*  HGB 12.6   < > 11.6* 11.0* 10.8* 10.1* 10.4*  HCT 38.9   < > 35.3* 33.7* 32.6* 30.5* 32.3*  PLT 119*   < > 129* 122* 144* 156 212  MCV 88.2   < > 84.7 84.7 84.7 84.7 85.9  MCH 28.6   < > 27.8 27.6 28.1 28.1 27.7  MCHC 32.4   < > 32.9 32.6 33.1 33.1 32.2  RDW 15.8*   < > 16.4* 16.3* 16.7* 16.4* 16.5*  LYMPHSABS 0.5*  --  1.4  --   --   --   --   MONOABS 0.5  --  0.5  --   --   --   --   EOSABS 0.0  --  0.0  --   --   --   --   BASOSABS 0.0  --  0.0  --   --   --   --    < > = values in this interval not displayed.    Chemistries  Recent Labs  Lab 07/14/17 1940 07/23/2017 1238  07/15/2017 2248 07/17/17 0443 07/18/17 0311 08/02/2017 1211 07/20/17 0258  NA 141 138  --  138  --  138  --   --   K 3.7 4.0  --  3.8  --  4.3  --   --   CL 109 104  --  111  --  111  --   --   CO2 21* 18*  --  17*  --  17*  --   --   GLUCOSE 94 72  --  148*  --  129*  --   --   BUN 40* 63*  --  56*  --  53*  --   --   CREATININE 1.40* 2.56*   < > 1.83* 1.72* 1.36* 0.82 0.68  CALCIUM 8.5* 8.5*  --  7.7*  --  7.8*  --   --   MG  --   --   --  1.9  --   --   --   --   AST  --  72*  --  49*  --  30  --   --   ALT  --  39  --  32  --  26  --   --   ALKPHOS  --  161*  --  129*  --  153*  --   --   BILITOT  --  1.2  --  1.1  --  0.8  --   --    < > = values in this interval not displayed.   ------------------------------------------------------------------------------------------------------------------ No results for input(s): CHOL, HDL, LDLCALC, TRIG, CHOLHDL, LDLDIRECT in the last 72 hours.  No results found for: HGBA1C ------------------------------------------------------------------------------------------------------------------ No results for input(s): TSH, T4TOTAL, T3FREE, THYROIDAB in the last 72  hours.  Invalid input(s): FREET3 ------------------------------------------------------------------------------------------------------------------ No results for input(s): VITAMINB12, FOLATE, FERRITIN, TIBC, IRON, RETICCTPCT in the last 72 hours.  Coagulation profile Recent Labs  Lab 08/02/2017 2248  INR 1.13    No results for input(s): DDIMER in the last 72 hours.  Cardiac Enzymes Recent Labs  Lab 07/24/2017 1733 07/31/2017 2032 08/06/2017 2248 07/17/17 0443  CKMB  --  10.4*  --   --   TROPONINI <0.03  --  0.03* 0.03*   ------------------------------------------------------------------------------------------------------------------ No results found for: BNP  Inpatient Medications  Scheduled Meds: . aspirin  325 mg Oral Daily  . mouth rinse  15 mL Mouth Rinse BID  . methocarbamol  500 mg Oral BID  . pantoprazole  40 mg Oral Daily   Continuous Infusions: . sodium chloride 20 mL/hr at 07/20/17 0600  . sodium chloride Stopped (07/26/2017 2150)  . heparin 2,100 Units/hr (07/20/17 0814)  . vancomycin 750 mg (07/20/17 0939)   PRN Meds:.acetaminophen, morphine injection, morphine injection  Micro Results Recent Results (from the past 240 hour(s))  Blood Culture (routine x 2)     Status: Abnormal   Collection Time: 07/24/2017  1:26 PM  Result Value Ref Range Status   Specimen Description   Final    RIGHT ANTECUBITAL Performed at [facility] [address] [place], [place] [address]    Special Requests   Final    BOTTLES DRAWN AEROBIC AND ANAEROBIC Blood Culture adequate volume Performed at [facility] [address] [place], [place] [address]    Culture  Setup Time   Final    GRAM POSITIVE COCCI IN BOTH AEROBIC AND ANAEROBIC BOTTLES CRITICAL RESULT CALLED TO, READ BACK BY AND VERIFIED WITH: Chales Abrahams PharmD 11:10 07/17/17 ([email or web address]) Performed at [facility], [place] [address] [place], [place] [address]    Culture METHICILLIN  RESISTANT STAPHYLOCOCCUS AUREUS (A)  Final   Report Status 08/04/2017 FINAL  Final   Organism ID, Bacteria METHICILLIN RESISTANT STAPHYLOCOCCUS AUREUS  Final      Susceptibility   Methicillin resistant staphylococcus aureus - MIC*    CIPROFLOXACIN >=8 RESISTANT Resistant     ERYTHROMYCIN >=8 RESISTANT Resistant     GENTAMICIN <=0.5 SENSITIVE Sensitive     OXACILLIN >=4 RESISTANT Resistant     TETRACYCLINE <=1 SENSITIVE Sensitive     VANCOMYCIN 1 SENSITIVE Sensitive     TRIMETH/SULFA <=10 SENSITIVE Sensitive     CLINDAMYCIN <=0.25 SENSITIVE Sensitive     RIFAMPIN <=0.5 SENSITIVE Sensitive     Inducible Clindamycin NEGATIVE Sensitive     * METHICILLIN RESISTANT STAPHYLOCOCCUS AUREUS  Blood Culture (routine x 2)     Status: None (Preliminary result)   Collection Time: 07/20/2017  1:26 PM  Result Value Ref Range Status   Specimen Description   Final    LEFT ANTECUBITAL Performed at [place] [address], [place], [place] [address]    Special  Requests   Final    BOTTLES DRAWN AEROBIC AND ANAEROBIC Blood Culture results may not be optimal due to an excessive volume of blood received in culture bottles Performed at [facility] [address], [place], [place] [address]    Culture   Final    NO GROWTH 3 DAYS Performed at [facility], [place] [address], [place], [place] [address]    Report Status PENDING  Incomplete  Urine culture     Status: None   Collection Time: 07/31/2017  1:26 PM  Result Value Ref Range Status   Specimen Description   Final    URINE, CLEAN CATCH Performed at [facility], [place] [address], [place], [place] [address]    Special Requests   Final    Normal Performed at [facility], [place] [address], [place], [place] [address]    Culture   Final    NO GROWTH Performed at [facility], [place] [address] [place], [place] [address]    Report Status 07/18/2017 FINAL  Final   Blood Culture ID Panel (Reflexed)     Status: Abnormal   Collection Time: 08/10/2017  1:26 PM  Result Value Ref Range Status   Enterococcus species NOT DETECTED NOT DETECTED Final   Listeria monocytogenes NOT DETECTED NOT DETECTED Final   Staphylococcus species DETECTED (A) NOT DETECTED Final    Comment: CRITICAL RESULT CALLED TO, READ BACK BY AND VERIFIED WITH: Chales Abrahams PharmD 11:10 07/17/17 ([email or web address])    Staphylococcus aureus DETECTED (A) NOT DETECTED Final    Comment: Methicillin (oxacillin)-resistant Staphylococcus aureus (MRSA). MRSA is predictably resistant to beta-lactam antibiotics (except ceftaroline). Preferred therapy is vancomycin unless clinically contraindicated. Patient requires contact precautions if  hospitalized. CRITICAL RESULT CALLED TO, READ BACK BY AND VERIFIED WITH: Chales Abrahams PharmD 11:10 07/17/17 ([email or web address])    Methicillin resistance DETECTED (A) NOT DETECTED Final    Comment: CRITICAL RESULT CALLED TO, READ BACK BY AND VERIFIED WITH: Chales Abrahams PharmD 11:10 07/17/17 ([email or web address])    Streptococcus species NOT DETECTED NOT DETECTED Final   Streptococcus agalactiae NOT DETECTED NOT DETECTED Final   Streptococcus pneumoniae NOT DETECTED NOT DETECTED Final   Streptococcus pyogenes NOT DETECTED NOT DETECTED Final   Acinetobacter baumannii NOT DETECTED NOT DETECTED Final   Enterobacteriaceae species NOT DETECTED NOT DETECTED Final   Enterobacter cloacae complex NOT DETECTED NOT DETECTED Final   Escherichia coli NOT DETECTED NOT DETECTED Final   Klebsiella oxytoca NOT DETECTED NOT DETECTED Final   Klebsiella pneumoniae NOT DETECTED NOT DETECTED Final   Proteus species NOT DETECTED NOT DETECTED Final   Serratia marcescens NOT DETECTED NOT DETECTED Final   Haemophilus influenzae NOT DETECTED NOT DETECTED Final   Neisseria meningitidis NOT DETECTED NOT DETECTED Final   Pseudomonas aeruginosa NOT DETECTED NOT DETECTED Final   Candida albicans NOT DETECTED NOT DETECTED Final   Candida  glabrata NOT DETECTED NOT DETECTED Final   Candida krusei NOT DETECTED NOT DETECTED Final   Candida parapsilosis NOT DETECTED NOT DETECTED Final   Candida tropicalis NOT DETECTED NOT DETECTED Final    Comment: Performed at [facility], [place] [address] [place], [place] [address]  Culture, blood (routine x 2) Call MD if unable to obtain prior to antibiotics being given     Status: None (Preliminary result)   Collection Time: 07/11/2017  8:35 PM  Result Value Ref Range Status   Specimen Description   Final    BLOOD LEFT ANTECUBITAL  Performed at [facility], [place] [address], [place], [place] [address]    Special Requests   Final    BOTTLES DRAWN AEROBIC AND ANAEROBIC Blood Culture adequate volume Performed at [facility] [address] [place], [place] [address]    Culture   Final    NO GROWTH 3 DAYS Performed at [facility], [place] [address] [place], [place] [address]    Report Status PENDING  Incomplete  Culture, blood (routine x 2) Call MD if unable to obtain prior to antibiotics being given     Status: None (Preliminary result)   Collection Time: 08/03/2017  9:00 PM  Result Value Ref Range Status   Specimen Description   Final    BLOOD LEFT ARM Performed at [facility] [address], [place], [place] [address]    Special Requests   Final    BOTTLES DRAWN AEROBIC AND ANAEROBIC Blood Culture adequate volume Performed at [facility] [address] [place], [place] [address]    Culture   Final    NO GROWTH 3 DAYS Performed at [facility], [place] [address], [place], [place] [address]    Report Status PENDING  Incomplete  Respiratory Panel by PCR     Status: None   Collection Time: 08/10/2017  9:42 PM  Result Value Ref Range Status   Adenovirus NOT DETECTED NOT DETECTED Final   Coronavirus 229E NOT DETECTED NOT DETECTED Final   Coronavirus HKU1 NOT DETECTED NOT DETECTED Final    Coronavirus NL63 NOT DETECTED NOT DETECTED Final   Coronavirus OC43 NOT DETECTED NOT DETECTED Final   Metapneumovirus NOT DETECTED NOT DETECTED Final   Rhinovirus / Enterovirus NOT DETECTED NOT DETECTED Final   Influenza A NOT DETECTED NOT DETECTED Final   Influenza B NOT DETECTED NOT DETECTED Final   Parainfluenza Virus 1 NOT DETECTED NOT DETECTED Final   Parainfluenza Virus 2 NOT DETECTED NOT DETECTED Final   Parainfluenza Virus 3 NOT DETECTED NOT DETECTED Final   Parainfluenza Virus 4 NOT DETECTED NOT DETECTED Final   Respiratory Syncytial Virus NOT DETECTED NOT DETECTED Final   Bordetella pertussis NOT DETECTED NOT DETECTED Final   Chlamydophila pneumoniae NOT DETECTED NOT DETECTED Final   Mycoplasma pneumoniae NOT DETECTED NOT DETECTED Final    Comment: Performed at [facility], [place] [address] [place], [place] [address]  MRSA PCR Screening     Status: None   Collection Time: 07/23/2017  9:43 PM  Result Value Ref Range Status   MRSA by PCR NEGATIVE NEGATIVE Final    Comment:        The GeneXpert MRSA Assay (FDA approved for NASAL specimens only), is one component of a comprehensive MRSA colonization surveillance program. It is not intended to diagnose MRSA infection nor to guide or monitor treatment for MRSA infections. Performed at [facility], [place] [address] [place], [place] [address]   Culture, blood (Routine X 2) w Reflex to ID Panel     Status: None (Preliminary result)   Collection Time: 07/17/17 12:06 PM  Result Value Ref Range Status   Specimen Description   Final    BLOOD RIGHT HAND Performed at [facility] [address] [place], [place] [address]    Special Requests   Final    BOTTLES DRAWN AEROBIC AND ANAEROBIC Blood Culture adequate volume Performed at [facility] [address], [place], [place] [address]  Culture   Final    NO GROWTH 2 DAYS Performed at [facility], [place] [address] [place], [place] [address]    Report Status PENDING  Incomplete  Culture, blood (Routine X 2) w Reflex to ID Panel     Status: None (Preliminary result)   Collection Time: 07/17/17 12:07 PM  Result Value Ref Range Status   Specimen Description   Final    BLOOD LEFT HAND Performed at [facility] [address] [place], [place] [address]    Special Requests   Final    BOTTLES DRAWN AEROBIC ONLY Blood Culture adequate volume Performed at [facility] [address] [place], [place] [address]    Culture   Final    NO GROWTH 2 DAYS Performed at [facility] [address] [place], [place] [address]    Report Status PENDING  Incomplete    Radiology Reports Ct Abdomen Pelvis Wo Contrast  Result Date: 08/02/2017 CLINICAL DATA:  Hypotension and abdominal pain EXAM: CT ABDOMEN AND PELVIS WITHOUT CONTRAST TECHNIQUE: Multidetector CT imaging of the abdomen and pelvis was performed following the standard protocol without IV contrast. COMPARISON:  None. FINDINGS: Lower chest: Lung bases are well aerated with the exception of mild lingular infiltrate adjacent to the heart. Hepatobiliary: No focal liver abnormality is seen. Status post cholecystectomy. No biliary dilatation. Pancreas: Unremarkable. No pancreatic ductal dilatation or surrounding inflammatory changes. Spleen: Normal in size without focal abnormality. Adrenals/Urinary Tract: The adrenals are within normal limits. Small nonobstructing stones are noted within the left kidney. The collecting systems are otherwise within normal limits. The bladder is partially decompressed. Stomach/Bowel: Postsurgical changes are noted in the stomach. A sliding-type hiatal hernia is noted. 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 The appendix is within normal limits. Vascular/Lymphatic: No significant vascular findings are present. No enlarged abdominal or pelvic lymph nodes. Reproductive:  Uterus and bilateral adnexa are unremarkable. Other: No abdominal wall hernia or abnormality. No abdominopelvic ascites. Musculoskeletal: Degenerative changes of lumbar spine are noted. IMPRESSION: Nonobstructing left renal stones. Postoperative changes as described. Mild lingular infiltrate. Electronically Signed   By: Inez Catalina M.D.   On: 07/11/2017 15:49   Dg Lumbar Spine Complete  Result Date: 07/11/2017 CLINICAL DATA:  Low back pain into hips for 2 days, fell 1 week ago EXAM: LUMBAR SPINE - COMPLETE 4+ VIEW COMPARISON:  CT abdomen and pelvis 11/17/2010 FINDINGS: Osseous demineralization. Five non-rib-bearing lumbar vertebra. Minimal biconvex thoracolumbar scoliosis. Multilevel degenerative disc disease changes with disc space narrowing and endplate spur formation. Facet degenerative changes lower lumbar spine. Vertebral body heights maintained without fracture or bone destruction. Minimal anterolisthesis at L4-L5 and retrolisthesis at L2-L3 noted. No definite spondylolysis. SI joints preserved. IMPRESSION: Osseous demineralization with degenerative disc and facet disease changes of the lumbar spine with associated biconvex thoracolumbar scoliosis and minimal listhesis. No acute abnormalities. Electronically Signed   By: Lavonia Dana M.D.   On: 07/11/2017 15:05   Ct Head Wo Contrast  Result Date: 08/07/2017 CLINICAL DATA:  Altered level of consciousness over the last 2 days, fatigue, hypotension EXAM: CT HEAD WITHOUT CONTRAST TECHNIQUE: Contiguous axial images were obtained from the base of the skull through the vertex without intravenous contrast. COMPARISON:  MR brain scan of 05/12/2014 FINDINGS: Brain: The ventricular system is normal in size and configuration and the septum is in a normal midline position. The fourth ventricle and basilar cisterns are unremarkable. No hemorrhage, mass lesion, or  acute infarction is seen. 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 Vascular:  No vascular abnormality is seen on this unenhanced study. Skull: On bone window images, changes of prior left mastoidectomy are noted. No acute calvarial abnormality is seen. Sinuses/Orbits: The paranasal sinuses appear well pneumatized. Other: None. IMPRESSION: 1. No acute intracranial abnormality. 2. 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 Electronically Signed   By: Ivar Drape M.D.   On: 07/18/2017 15:45   Ct Lumbar Spine Wo Contrast  Result Date: 07/14/2017 CLINICAL DATA:  Initial evaluation for low back pain with bilateral leg pain. EXAM: CT LUMBAR SPINE WITHOUT CONTRAST TECHNIQUE: Multidetector CT imaging of the lumbar spine was performed without intravenous contrast administration. Multiplanar CT image reconstructions were also generated. COMPARISON:  Prior radiograph 07/11/2017. FINDINGS: Segmentation: Normal segmentation. Lowest well-formed disc labeled the L5-S1 level. Alignment: 3 mm retrolisthesis of L2 on L3, with up to 4 mm retrolisthesis of L4 on L5. Findings are likely chronic and facet mediated. Mild levoscoliosis, apex at L3. Vertebrae: Vertebral body heights maintained without evidence for acute or chronic fracture. Chronic reactive endplate changes present about the right aspect of the L3-4 interspace due to scoliotic curvature. No discrete lytic or blastic osseous lesions. Visualized sacrum and pelvis unremarkable. Paraspinal and other soft tissues: No acute paraspinous soft tissue abnormality. Patient status post gastric bypass. Punctate nonobstructive left renal nephrolithiasis. Disc levels: L1-2: Mild diffuse disc bulge, asymmetric to the right. Mild bilateral facet hypertrophy. No significant canal stenosis. Mild right L1 foraminal narrowing. L2-3: Retrolisthesis. Chronic intervertebral disc space narrowing with disc bulge. Mild facet hypertrophy. Resultant mild canal with moderate bilateral L2 foraminal stenosis. L3-4: Chronic intervertebral disc space  narrowing with diffuse disc bulge. Associated disc desiccation noted. Moderate facet hypertrophy. Resultant moderate canal with bilateral lateral recess narrowing. Severe right with moderate left L3 foraminal stenosis. L4-5: Anterolisthesis. Diffuse disc bulge, slightly asymmetric to the right. Severe facet arthropathy. Resultant severe canal with bilateral subarticular stenosis. Moderate bilateral L4 foraminal narrowing, slightly worse on the left. L5-S1: Diffuse disc bulge with intervertebral disc space narrowing. Severe left with moderate right facet arthrosis. Resultant mild left lateral recess narrowing without significant canal stenosis. Moderate left L5 foraminal narrowing. No significant right foraminal encroachment. IMPRESSION: 1. No acute abnormality within the lumbar spine. 2. Levoscoliosis with multilevel degenerative spondylolysis with resultant multilevel canal narrowing as above, moderate at L3-4 and severe at L4-5. 3. Multifactorial degenerative changes with resultant multilevel foraminal narrowing as above. 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 4. Punctate nonobstructive left renal nephrolithiasis. Electronically Signed   By: Jeannine Boga M.D.   On: 07/14/2017 20:24   Mr Brain Wo Contrast  Result Date: 07/17/2017 CLINICAL DATA:  Worsening mental status. 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 EXAM: MRI HEAD WITHOUT CONTRAST TECHNIQUE: Multiplanar, multiecho pulse sequences of the brain and surrounding structures were obtained without intravenous contrast. COMPARISON:  Head CT 08/06/2017.  MRI 05/12/2014. FINDINGS: Brain: Diffusion imaging does not show any acute or subacute infarction. There has been previous left mastoidectomy which appears the same as on previous exams. No evidence of any abnormality of  the brainstem, cerebellum or cerebral hemispheres. No old infarction, mass lesion, hemorrhage, hydrocephalus or extra-axial collection. Vascular: Major vessels at the base of the brain show flow. Skull and upper cervical spine: Otherwise negative. Sinuses/Orbits: Clear/normal  Other: None IMPRESSION: No acute or reversible finding. No abnormality seen to explain worsening mental status. Distant left mastoidectomy without complicating feature. Electronically Signed   By: Nelson Chimes M.D.   On: 07/17/2017 07:01   Mr Thoracic Spine Wo Contrast  Result Date: 07/17/2017 CLINICAL DATA:  Altered mental status. Sepsis with elevated white blood cell count. Possible meningitis. EXAM: MRI THORACIC AND LUMBAR SPINE WITHOUT CONTRAST TECHNIQUE: Multiplanar and multiecho pulse sequences of the thoracic and lumbar spine were obtained without intravenous contrast. COMPARISON:  CT abdomen and pelvis 07/25/2017. FINDINGS: MRI THORACIC SPINE FINDINGS Alignment: There is exaggeration of the normal thoracic kyphosis. No listhesis. Vertebrae: Vertebral body height and signal are maintained. No evidence of discitis or osteomyelitis. No fracture or worrisome lesion. Cord:  Normal signal throughout. Paraspinal and other soft tissues: A 0.8 cm nodular opacity is seen posteriorly in the right lung on image 12 of series 8. Small bilateral pleural effusions are identified. The patient also has a small hiatal hernia. Disc levels: The central spinal canal and neural foramina are patent at all levels. Small central disc protrusion at T7-8 is noted. MRI LUMBAR SPINE FINDINGS Segmentation:  Standard. Alignment: Trace retrolisthesis L2 on L3 is identified. There is convex left scoliosis. Vertebrae: No fracture or worrisome lesion. No evidence of infectious process. Conus medullaris and cauda equina: Conus extends to the L1-2 level. Conus and cauda equina appear normal. Paraspinal and other soft tissues: Negative. Disc levels: L1-2: Minimal disc  bulge without stenosis. L2-3: Shallow disc bulge is identified. Loss of disc space height is noted. The central canal is mildly narrowed. The foramina are open. L3-4: There is loss of disc space height, a shallow disc bulge and vacuum disc phenomenon. The central canal and foramina remain open. L4-5: Bilateral facet degenerative change with associated small effusions. Shallow disc bulge. The central canal and foramina are open. L5-S1: Shallow disc bulge without stenosis. IMPRESSION: MR THORACIC SPINE IMPRESSION No acute abnormality.  Negative for source of infection. 0.7 cm nodular opacity in the right lung cannot be definitively characterized. Recommend CT chest in 6 months to ensure stability. MR LUMBAR SPINE IMPRESSION No acute abnormality.  Negative for source of infection. Mild central canal narrowing L2-3 due to a shallow disc bulge. Scattered facet arthropathy appearing worst at L4-5. Electronically Signed   By: Inge Rise M.D.   On: 07/17/2017 08:06   Mr Lumbar Spine Wo Contrast  Result Date: 07/17/2017 CLINICAL DATA:  Altered mental status. Sepsis with elevated white blood cell count. Possible meningitis. EXAM: MRI THORACIC AND LUMBAR SPINE WITHOUT CONTRAST TECHNIQUE: Multiplanar and multiecho pulse sequences of the thoracic and lumbar spine were obtained without intravenous contrast. COMPARISON:  CT abdomen and pelvis 08/09/2017. FINDINGS: MRI THORACIC SPINE FINDINGS Alignment: There is exaggeration of the normal thoracic kyphosis. No listhesis. Vertebrae: Vertebral body height and signal are maintained. No evidence of discitis or osteomyelitis. No fracture or worrisome lesion. Cord:  Normal signal throughout. Paraspinal and other soft tissues: A 0.8 cm nodular opacity is seen posteriorly in the right lung on image 12 of series 8. Small bilateral pleural effusions are identified. The patient also has a small hiatal hernia. Disc levels: The central spinal canal and neural foramina are patent at all  levels. Small central disc protrusion at T7-8 is noted. MRI LUMBAR SPINE FINDINGS Segmentation:  Standard. Alignment: Trace retrolisthesis L2 on L3 is identified. There is convex left scoliosis. Vertebrae: No fracture or worrisome lesion. No evidence of infectious process. Conus medullaris and cauda  equina: Conus extends to the L1-2 level. Conus and cauda equina appear normal. Paraspinal and other soft tissues: Negative. Disc levels: L1-2: Minimal disc bulge without stenosis. L2-3: Shallow disc bulge is identified. Loss of disc space height is noted. The central canal is mildly narrowed. The foramina are open. L3-4: There is loss of disc space height, a shallow disc bulge and vacuum disc phenomenon. The central canal and foramina remain open. L4-5: Bilateral facet degenerative change with associated small effusions. Shallow disc bulge. The central canal and foramina are open. L5-S1: Shallow disc bulge without stenosis. IMPRESSION: MR THORACIC SPINE IMPRESSION No acute abnormality.  Negative for source of infection. 0.7 cm nodular opacity in the right lung cannot be definitively characterized. Recommend CT chest in 6 months to ensure stability. MR LUMBAR SPINE IMPRESSION No acute abnormality.  Negative for source of infection. Mild central canal narrowing L2-3 due to a shallow disc bulge. Scattered facet arthropathy appearing worst at L4-5. Electronically Signed   By: Inge Rise M.D.   On: 07/17/2017 08:06   Mr Ankle Right Wo Contrast  Result Date: 07/18/2017 CLINICAL DATA:  Blood cultures positive for MRSA. Pain and swelling of both ankles. EXAM: MRI OF THE RIGHT ANKLE WITHOUT CONTRAST TECHNIQUE: Multiplanar, multisequence MR imaging of the ankle was performed. No intravenous contrast was administered. COMPARISON:  None. FINDINGS: TENDONS Peroneal: Intact. Posteromedial: Intact. Anterior: Intact. Achilles: The distal tendon is somewhat thickened with calcifications within it consistent with chronic  tendinosis. Mild edema in a calcaneal spur at the tendon insertion is noted. Plantar Fascia: Intact.  Very mild edema is seen in the medial cord. LIGAMENTS Lateral: Intact. Medial: Intact. CARTILAGE Ankle Joint: Normal. Subtalar Joints/Sinus Tarsi: Normal. Bones: No marrow signal abnormality to suggest osteomyelitis. No fracture or focal lesion. Other: Subcutaneous edema is present about the ankle. IMPRESSION: Subcutaneous edema about the ankle could be due to dependent change and/or cellulitis. Negative for abscess, septic joint or osteomyelitis. Chronic Achilles tendinopathy. Mild plantar fasciitis of the medial cord. Electronically Signed   By: Inge Rise M.D.   On: 07/18/2017 12:16   Mr Ankle Left Wo Contrast  Result Date: 07/18/2017 CLINICAL DATA:  Blood cultures positive for MRSA. Pain and swelling of both ankles. EXAM: MRI OF THE LEFT ANKLE WITHOUT CONTRAST TECHNIQUE: Multiplanar, multisequence MR imaging of the ankle was performed. No intravenous contrast was administered. COMPARISON:  None. FINDINGS: TENDONS Peroneal: Intact. Posteromedial: Intact. Anterior: Intact. Achilles: Intact. The distal tendon is thickened with calcifications within it. Mild marrow edema is seen in a calcaneal spur at the Achilles tendon insertion. Plantar Fascia: Unremarkable. LIGAMENTS Lateral: Intact. Medial: Intact. CARTILAGE Ankle Joint: Normal. Subtalar Joints/Sinus Tarsi: Unremarkable. Bones: Normal marrow signal throughout. No evidence of osteomyelitis. Other: Subcutaneous edema is present about the ankle. No focal fluid collection. IMPRESSION: Subcutaneous edema about the ankle could be due to dependent change or cellulitis. Negative for osteomyelitis, abscess or septic joint. Chronic Achilles tendinopathy. Electronically Signed   By: Inge Rise M.D.   On: 07/18/2017 12:38   Dg Chest Port 1 View  Result Date: 07/12/2017 CLINICAL DATA:  Cough. EXAM: PORTABLE CHEST 1 VIEW COMPARISON:  Chest x-ray dated June 13, 2017. FINDINGS: The heart size and mediastinal contours are within normal limits. Normal pulmonary vascularity. Resolved consolidation in the lingula with residual scarring/atelectasis. No focal consolidation, pleural effusion, or pneumothorax. No acute osseous abnormality. IMPRESSION: No active disease. Electronically Signed   By: Titus Dubin M.D.   On: 07/14/2017 13:11   Dg Chest  Port 1v Same Day  Result Date: 07/30/2017 CLINICAL DATA:  MRSA bacteremia and septic shock.  Unknown source. EXAM: PORTABLE CHEST 1 VIEW COMPARISON:  07/23/2017 CXR FINDINGS: Heart and mediastinal contours are stable with slightly uncoiled appearance of the nonaneurysmal thoracic aorta. Slight increase in left basilar atelectasis since prior. No alveolar consolidation or effusion. No pneumothorax. No acute osseous abnormality. IMPRESSION: Slight increase in left basilar atelectasis. Otherwise, no significant change is identified. Electronically Signed   By: Ashley Royalty M.D.   On: 07/31/2017 20:09    Time Spent in minutes  30 min   Velvet Bathe M.D on 07/20/2017 at 9:53 AM  Between 7am to 7pm - Pager [phone]  After 7pm go to [email or web address] - password [email or web address]  [facility] -  Office  [phone]

## 2017-07-20 NOTE — Progress Notes (Addendum)
[place] for heparin Indication: atrial fibrillation  No Known Allergies  Patient Measurements: Height: 5\' 7"  (170.2 cm) Weight: 182 lb (82.6 kg) IBW/kg (Calculated) : 61.6 Heparin Dosing Weight: 78 kg  Vital Signs: Temp: 98.3 F (36.8 C) (05/10 0302) Temp Source: Axillary (05/10 0302) BP: 170/91 (05/10 0600) Pulse Rate: 100 (05/10 0500)  Labs: Recent Labs    07/18/17 0311  07/14/2017 0418 07/28/2017 1211 07/20/17 0258  HGB 10.8*  --  10.1*  --  10.4*  HCT 32.6*  --  30.5*  --  32.3*  PLT 144*  --  156  --  212  HEPARINUNFRC 0.15*   < > 0.43 0.45 0.21*  CREATININE 1.36*  --   --  0.82 0.68   < > = values in this interval not displayed.    Estimated Creatinine Clearance: 80.6 mL/min (by C-G formula based on SCr of 0.68 mg/dL).   Infusions: . sodium chloride 20 mL/hr at 07/20/17 0600  . sodium chloride Stopped (07/12/2017 2150)  . heparin 1,950 Units/hr (07/20/17 0600)  . vancomycin Stopped (07/27/2017 2227)      Assessment: Patient's a 62 y.o F presented to the ED on 07/25/2017 with hypotension, AMS and hallucination.  She was found to be in afib with RVR and now on heparin drip.  5/9: DCCV - return to NSR; TEE neg for endocarditis  Today, 07/20/2017: - heparin level 0.21, decreased to sub-therapeutic on heparin at 19.5 ml/hr - cbc relatively stable.  Hgb 10.4, Plt WNL - no bleeding documented or reported by RN.  Goal of Therapy:  Heparin level 0.3-0.7 units/ml Monitor platelets by anticoagulation protocol: Yes   Plan:   Increase to heparin IV infusion at 2100 units/hr Heparin level 6 hours after starting Daily heparin level Monitor for s/s bleeding Follow up long-term anticoagulation plans.  Gretta Arab PharmD, BCPS Pager [phone] 07/20/2017 7:14 AM   Addendum: Heparin level increased to 0.27, but remains sub-therapeutic on heparin at 21 ml/hr. No bleeding or complications reported. Plan: Increase to heparin IV  infusion at 2300 units/hr Heparin level 6 hours after starting Per cards, continue IV heparin for the remainder of her hospitalization.  If there is no recurrent afib by discharge,do not start oral anticoagulants.  Gretta Arab PharmD, BCPS Pager [phone] 07/20/2017 3:00 PM

## 2017-07-21 ENCOUNTER — Inpatient Hospital Stay (HOSPITAL_COMMUNITY): Payer: BLUE CROSS/BLUE SHIELD

## 2017-07-21 DIAGNOSIS — G935 Compression of brain: Secondary | ICD-10-CM

## 2017-07-21 DIAGNOSIS — I469 Cardiac arrest, cause unspecified: Secondary | ICD-10-CM

## 2017-07-21 DIAGNOSIS — G936 Cerebral edema: Secondary | ICD-10-CM

## 2017-07-21 DIAGNOSIS — R402434 Glasgow coma scale score 3-8, 24 hours or more after hospital admission: Secondary | ICD-10-CM

## 2017-07-21 DIAGNOSIS — I616 Nontraumatic intracerebral hemorrhage, multiple localized: Secondary | ICD-10-CM

## 2017-07-21 DIAGNOSIS — I619 Nontraumatic intracerebral hemorrhage, unspecified: Secondary | ICD-10-CM

## 2017-07-21 DIAGNOSIS — J9601 Acute respiratory failure with hypoxia: Secondary | ICD-10-CM

## 2017-07-21 DIAGNOSIS — S06359A Traumatic hemorrhage of left cerebrum with loss of consciousness of unspecified duration, initial encounter: Secondary | ICD-10-CM

## 2017-07-21 LAB — BLOOD GAS, ARTERIAL
ACID-BASE DEFICIT: 5.7 mmol/L — AB (ref 0.0–2.0)
Bicarbonate: 17.6 mmol/L — ABNORMAL LOW (ref 20.0–28.0)
Drawn by: 295031
FIO2: 100
O2 SAT: 97.8 %
PEEP/CPAP: 5 cmH2O
PH ART: 7.396 (ref 7.350–7.450)
Patient temperature: 98.6
RATE: 16 resp/min
VT: 500 mL
pCO2 arterial: 29.4 mmHg — ABNORMAL LOW (ref 32.0–48.0)
pO2, Arterial: 223 mmHg — ABNORMAL HIGH (ref 83.0–108.0)

## 2017-07-21 LAB — COMPREHENSIVE METABOLIC PANEL
ALBUMIN: 1.8 g/dL — AB (ref 3.5–5.0)
ALT: 30 U/L (ref 14–54)
AST: 50 U/L — AB (ref 15–41)
Alkaline Phosphatase: 220 U/L — ABNORMAL HIGH (ref 38–126)
Anion gap: 13 (ref 5–15)
BILIRUBIN TOTAL: 2 mg/dL — AB (ref 0.3–1.2)
BUN: 14 mg/dL (ref 6–20)
CHLORIDE: 107 mmol/L (ref 101–111)
CO2: 16 mmol/L — AB (ref 22–32)
Calcium: 7.7 mg/dL — ABNORMAL LOW (ref 8.9–10.3)
Creatinine, Ser: 0.66 mg/dL (ref 0.44–1.00)
GFR calc Af Amer: >60 mL/min (ref >60)
GFR calc non Af Amer: >60 mL/min (ref >60)
GLUCOSE: 164 mg/dL — AB (ref 65–99)
POTASSIUM: 3.8 mmol/L (ref 3.5–5.1)
SODIUM: 136 mmol/L (ref 135–145)
Total Protein: 5.8 g/dL — ABNORMAL LOW (ref 6.5–8.1)

## 2017-07-21 LAB — CULTURE, BLOOD (ROUTINE X 2)
CULTURE: NO GROWTH
Culture: NO GROWTH
Culture: NO GROWTH
SPECIAL REQUESTS: ADEQUATE
Special Requests: ADEQUATE

## 2017-07-21 LAB — CBC
HEMATOCRIT: 30.8 % — AB (ref 36.0–46.0)
Hemoglobin: 10 g/dL — ABNORMAL LOW (ref 12.0–15.0)
MCH: 27.8 pg (ref 26.0–34.0)
MCHC: 32.5 g/dL (ref 30.0–36.0)
MCV: 85.6 fL (ref 78.0–100.0)
PLATELETS: 257 10*3/uL (ref 150–400)
RBC: 3.6 MIL/uL — ABNORMAL LOW (ref 3.87–5.11)
RDW: 16.3 % — AB (ref 11.5–15.5)
WBC: 20.6 10*3/uL — ABNORMAL HIGH (ref 4.0–10.5)

## 2017-07-21 LAB — CREATININE, SERUM
CREATININE: 0.64 mg/dL (ref 0.44–1.00)
GFR calc Af Amer: >60 mL/min (ref >60)
GFR calc non Af Amer: >60 mL/min (ref >60)

## 2017-07-21 LAB — MAGNESIUM: Magnesium: 1.7 mg/dL (ref 1.7–2.4)

## 2017-07-21 LAB — GLUCOSE, CAPILLARY: GLUCOSE-CAPILLARY: 136 mg/dL — AB (ref 65–99)

## 2017-07-21 LAB — HEPARIN LEVEL (UNFRACTIONATED): Heparin Unfractionated: 0.32 IU/mL (ref 0.30–0.70)

## 2017-07-21 LAB — TRIGLYCERIDES: Triglycerides: 87 mg/dL (ref <150)

## 2017-07-21 LAB — AMMONIA: Ammonia: 31 umol/L (ref 9–35)

## 2017-07-21 LAB — PHOSPHORUS: Phosphorus: 3.6 mg/dL (ref 2.5–4.6)

## 2017-07-21 MED ORDER — NALOXONE HCL 0.4 MG/ML IJ SOLN
INTRAMUSCULAR | Status: AC
  Administered 2017-07-21: 0.4 mg
  Filled 2017-07-21: qty 1

## 2017-07-21 MED ORDER — GLYCOPYRROLATE 1 MG PO TABS: 1.0000 mg | ORAL_TABLET | ORAL | Status: DC | PRN

## 2017-07-21 MED ORDER — HALOPERIDOL LACTATE 5 MG/ML IJ SOLN: 0.5000 mg | INTRAMUSCULAR | Status: DC | PRN

## 2017-07-21 MED ORDER — FENTANYL 2500MCG IN NS 250ML (10MCG/ML) PREMIX INFUSION: 0.0000 ug/h | INTRAVENOUS | Status: DC

## 2017-07-21 MED ORDER — MIDAZOLAM HCL 2 MG/2ML IJ SOLN
INTRAMUSCULAR | Status: AC
  Administered 2017-07-21: 2 mg via INTRAVENOUS
  Filled 2017-07-21: qty 2

## 2017-07-21 MED ORDER — DILTIAZEM HCL-DEXTROSE 100-5 MG/100ML-% IV SOLN (PREMIX)
5.0000 mg/h | INTRAVENOUS | Status: DC
  Administered 2017-07-21: 15 mg/h via INTRAVENOUS
  Administered 2017-07-21: 5 mg/h via INTRAVENOUS
  Filled 2017-07-21 (×3): qty 100

## 2017-07-21 MED ORDER — AMIODARONE HCL 200 MG PO TABS: 400.0000 mg | ORAL_TABLET | Freq: Two times a day (BID) | ORAL | Status: DC

## 2017-07-21 MED ORDER — HALOPERIDOL 1 MG PO TABS: 0.5000 mg | ORAL_TABLET | ORAL | Status: DC | PRN

## 2017-07-21 MED ORDER — ONDANSETRON HCL 4 MG/2ML IJ SOLN: 4.0000 mg | Freq: Four times a day (QID) | INTRAMUSCULAR | Status: DC | PRN

## 2017-07-21 MED ORDER — ONDANSETRON 4 MG PO TBDP: 4.0000 mg | ORAL_TABLET | Freq: Four times a day (QID) | ORAL | Status: DC | PRN

## 2017-07-21 MED ORDER — POLYVINYL ALCOHOL 1.4 % OP SOLN: 1.0000 [drp] | Freq: Four times a day (QID) | OPHTHALMIC | Status: DC | PRN

## 2017-07-21 MED ORDER — ACETAMINOPHEN 650 MG RE SUPP: 650.0000 mg | Freq: Four times a day (QID) | RECTAL | Status: DC | PRN

## 2017-07-21 MED ORDER — SODIUM CHLORIDE 0.9 % IV BOLUS
500.0000 mL | Freq: Once | INTRAVENOUS | Status: AC
  Administered 2017-07-21: 500 mL via INTRAVENOUS

## 2017-07-21 MED ORDER — HALOPERIDOL LACTATE 2 MG/ML PO CONC
0.5000 mg | ORAL | Status: DC | PRN
  Filled 2017-07-21: qty 0.3

## 2017-07-21 MED ORDER — PROPOFOL 1000 MG/100ML IV EMUL
5.0000 ug/kg/min | INTRAVENOUS | Status: DC
  Administered 2017-07-21: 5 ug/kg/min via INTRAVENOUS

## 2017-07-21 MED ORDER — PROPOFOL 1000 MG/100ML IV EMUL
INTRAVENOUS | Status: AC
  Administered 2017-07-21: 5 ug/kg/min via INTRAVENOUS
  Filled 2017-07-21: qty 100

## 2017-07-21 MED ORDER — GLYCOPYRROLATE 0.2 MG/ML IJ SOLN: 0.2000 mg | INTRAMUSCULAR | Status: DC | PRN

## 2017-07-21 MED ORDER — ACETAMINOPHEN 325 MG PO TABS: 650.0000 mg | ORAL_TABLET | Freq: Four times a day (QID) | ORAL | Status: DC | PRN

## 2017-07-21 MED ORDER — MIDAZOLAM HCL 2 MG/2ML IJ SOLN
2.0000 mg | Freq: Once | INTRAMUSCULAR | Status: AC
  Administered 2017-07-21: 2 mg via INTRAVENOUS

## 2017-07-21 MED ORDER — BIOTENE DRY MOUTH MT LIQD: 15.0000 mL | OROMUCOSAL | Status: DC | PRN

## 2017-07-21 MED ORDER — PANTOPRAZOLE SODIUM 40 MG PO PACK: 40.0000 mg | PACK | Freq: Every day | ORAL | Status: DC

## 2017-07-21 MED ORDER — GADOBENATE DIMEGLUMINE 529 MG/ML IV SOLN
20.0000 mL | Freq: Once | INTRAVENOUS | Status: AC | PRN
  Administered 2017-07-21: 20 mL via INTRAVENOUS

## 2017-07-22 LAB — CULTURE, BLOOD (ROUTINE X 2)
Culture: NO GROWTH
Culture: NO GROWTH
SPECIAL REQUESTS: ADEQUATE
SPECIAL REQUESTS: ADEQUATE

## 2017-07-24 ENCOUNTER — Telehealth: Payer: Self-pay

## 2017-07-24 NOTE — Telephone Encounter (Signed)
On 07/24/17 I received a d/c from [facility] (original).  The d/c is for cremation. The patient is a patient of Doctor Sivakumar. The d/c will be taken to [facility] ICU for signature.  On 07/26/17 I received the d/c back from Doctor Halford Chessman who signed the d/c for Doctor Sivakumar.  I got the d/c ready and called the funeral home to let them know the d/c is ready for pickup.  I also faxed a copy to the funeral home per the funeral home request.

## 2017-07-25 LAB — CULTURE, BLOOD (ROUTINE X 2)
CULTURE: NO GROWTH
Culture: NO GROWTH
SPECIAL REQUESTS: ADEQUATE
Special Requests: ADEQUATE

## 2017-08-02 ENCOUNTER — Ambulatory Visit: Payer: BLUE CROSS/BLUE SHIELD | Admitting: Cardiovascular Disease

## 2017-08-11 NOTE — Progress Notes (Signed)
UPON ARRIVAL TO UNIT; NEURO MD REQUESTED STAT CT SCAN. PATIENT TRANSPORTED TO CT SCAN WITH 2 RN; MRI TEAM; NEURO NP; AND RT.

## 2017-08-11 NOTE — Progress Notes (Signed)
ANTICOAGULATION CONSULT NOTE   Pharmacy Consult for heparin Indication: atrial fibrillation  No Known Allergies  Patient Measurements: Height: 5\' 7"  (170.2 cm) Weight: 182 lb (82.6 kg) IBW/kg (Calculated) : 61.6 Heparin Dosing Weight: 78 kg  Vital Signs: Temp: 99.1 F (37.3 C) (05/11 0333) Temp Source: Axillary (05/11 0333) BP: 118/85 (05/11 0717) Pulse Rate: 139 (05/11 0648)  Labs: Recent Labs    07/22/2017 0418 07/20/2017 1211 07/20/17 0258 07/20/17 1411 07/20/17 2129 07-24-17 0338  HGB 10.1*  --  10.4*  --   --  10.0*  HCT 30.5*  --  32.3*  --   --  30.8*  PLT 156  --  212  --   --  257  HEPARINUNFRC 0.43 0.45 0.21* 0.27* 0.39 0.32  CREATININE  --  0.82 0.68  --   --  0.64    Estimated Creatinine Clearance: 80.6 mL/min (by C-G formula based on SCr of 0.64 mg/dL).   Infusions: . sodium chloride 20 mL/hr at 07/20/17 0600  . sodium chloride Stopped (07/14/2017 2150)  . diltiazem (CARDIZEM) infusion 15 mg/hr (07/24/2017 0729)  . heparin 2,300 Units/hr (24-Jul-2017 0347)  . vancomycin Stopped (07/20/17 2207)      Assessment: Patient's a 62 y.o F presented to the ED on 07/31/2017 with hypotension, AMS and hallucination.  She was found to be in afib with RVR and now on heparin drip.  5/9: DCCV - return to NSR; TEE neg for endocarditis  Today, Jul 24, 2017: Heparin level 0.32,  Remains therapeutic on heparin at 23 ml/hr CBC relatively stable.  Hgb 10, Plt WNL No bleeding documented or reported by RN.  Goal of Therapy:  Heparin level 0.3-0.7 units/ml Monitor platelets by anticoagulation protocol: Yes   Plan:   Continue heparin IV infusion at 2300 units/hr  Daily heparin level and CBC  Monitor for s/s bleeding  Follow up long-term anticoagulation plans.  Per cards, continue IV heparin for the remainder of her hospitalization.  If there is no recurrent afib by discharge,do not start oral anticoagulants.  Gretta Arab PharmD, BCPS Pager [phone] 2017/07/24 7:32 AM

## 2017-08-11 NOTE — Progress Notes (Signed)
Progress Note  Patient Name: Theresa Morrow Date of Encounter: 08-06-17  Primary Cardiologist: Dr. Dani Gobble Croitoru  Subjective   Somnolent this morning.  No obvious pain or shortness of breath at rest.  Inpatient Medications    Scheduled Meds: . aspirin  325 mg Oral Daily  . mouth rinse  15 mL Mouth Rinse BID  . methocarbamol  500 mg Oral BID  . metroNIDAZOLE  500 mg Oral Q8H  . pantoprazole  40 mg Oral Daily   Continuous Infusions: . sodium chloride 20 mL/hr at 07/20/17 0600  . sodium chloride Stopped (08/02/2017 2150)  . diltiazem (CARDIZEM) infusion 15 mg/hr (2017/08/06 0729)  . heparin 2,300 Units/hr (2017/08/06 0347)  . vancomycin Stopped (07/20/17 2207)   PRN Meds: acetaminophen, morphine injection   Vital Signs    Vitals:   06-Aug-2017 0400 2017-08-06 0540 08/06/2017 0648 2017-08-06 0717  BP: 126/74 112/85 96/66 118/85  Pulse: (!) 103 (!) 154 (!) 139   Resp: 16 15 18  (!) 22  Temp:      TempSrc:      SpO2: 95% 97% 97% 96%  Weight:      Height:        Intake/Output Summary (Last 24 hours) at 2017/08/06 0801 Last data filed at 08/06/17 0647 Gross per 24 hour  Intake 1334.75 ml  Output 1825 ml  Net -490.25 ml   Filed Weights   07/31/2017 1234  Weight: 182 lb (82.6 kg)    Telemetry    Recurrent rapid atrial fibrillation.  Personally reviewed.  ECG    Tracing from 07/17/2017 showed sinus rhythm with low voltage.  Personally reviewed.  Physical Exam   GEN:  Ill-appearing, no acute distress.  Neck: No JVD. Cardiac:  Irregularly irregular, no murmur or gallop. Respiratory: Nonlabored.  No wheezing. GI: Soft, nontender, bowel sounds diminished. MS:  Bilateral leg edema; No deformity. Neuro:   Is all extremities. Psych: Somnolent.  Labs    Chemistry Recent Labs  Lab 07/20/2017 1238  07/14/2017 2248  07/18/17 0311 08/10/2017 1211 07/20/17 0258 06-Aug-2017 0338  NA 138  --  138  --  138  --   --   --   K 4.0  --  3.8  --  4.3  --   --   --   CL 104  --  111   --  111  --   --   --   CO2 18*  --  17*  --  17*  --   --   --   GLUCOSE 72  --  148*  --  129*  --   --   --   BUN 63*  --  56*  --  53*  --   --   --   CREATININE 2.56*   < > 1.83*   < > 1.36* 0.82 0.68 0.64  CALCIUM 8.5*  --  7.7*  --  7.8*  --   --   --   PROT 6.2*  --  5.1*  --  5.4*  --   --   --   ALBUMIN 2.7*  --  2.1*  --  2.1*  --   --   --   AST 72*  --  49*  --  30  --   --   --   ALT 39  --  32  --  26  --   --   --   ALKPHOS 161*  --  129*  --  153*  --   --   --   BILITOT 1.2  --  1.1  --  0.8  --   --   --   GFRNONAA 19*   < > 28*   < > 41* >60 >60 >60  GFRAA 22*   < > 33*   < > 47* >60 >60 >60  ANIONGAP 16*  --  10  --  10  --   --   --    < > = values in this interval not displayed.     Hematology Recent Labs  Lab 08/09/2017 0418 07/20/17 0258 08/07/17 0338  WBC 14.8* 14.2* 20.6*  RBC 3.60* 3.76* 3.60*  HGB 10.1* 10.4* 10.0*  HCT 30.5* 32.3* 30.8*  MCV 84.7 85.9 85.6  MCH 28.1 27.7 27.8  MCHC 33.1 32.2 32.5  RDW 16.4* 16.5* 16.3*  PLT 156 212 257    Cardiac Enzymes Recent Labs  Lab 07/14/2017 1733 07/29/2017 2248 07/17/17 0443  TROPONINI <0.03 0.03* 0.03*   No results for input(s): TROPIPOC in the last 168 hours.   DDimer  Recent Labs  Lab 07/30/2017 1748  DDIMER 7.82*     Radiology    Dg Chest Port 1v Same Day  Result Date: 08/05/2017 CLINICAL DATA:  MRSA bacteremia and septic shock.  Unknown source. EXAM: PORTABLE CHEST 1 VIEW COMPARISON:  08/10/2017 CXR FINDINGS: Heart and mediastinal contours are stable with slightly uncoiled appearance of the nonaneurysmal thoracic aorta. Slight increase in left basilar atelectasis since prior. No alveolar consolidation or effusion. No pneumothorax. No acute osseous abnormality. IMPRESSION: Slight increase in left basilar atelectasis. Otherwise, no significant change is identified. Electronically Signed   By: Ashley Royalty M.D.   On: 07/23/2017 20:09    Cardiac Studies   TEE 07/11/2017: Study Conclusions  -  Left ventricle: The cavity size was normal. Wall thickness was   normal. Systolic function was normal. The estimated ejection   fraction was in the range of 60% to 65%. Wall motion was normal;   there were no regional wall motion abnormalities. - Aortic valve: There was mild regurgitation. - Left atrium: No evidence of thrombus in the atrial cavity or   appendage. The appendage was morphologically a left appendage,   multilobulated, and of normal size. Emptying velocity was mildly   reduced. - Right atrium: No evidence of thrombus in the atrial cavity or   appendage. - Atrial septum: No defect or patent foramen ovale was identified. - Tricuspid valve: There was mild-moderate regurgitation directed   centrally. - Pulmonary arteries: PA peak pressure: 31 mm Hg (S).  Impressions:  - No evidence of endocarditis. There was no evidence of a   vegetation.  Patient Profile     62 y.o. female presenting with sepsis and Staphylococcus aureus bacteremia, TEE negative for valvular vegetations, also paroxysmal atrial fibrillation with RVR.  She converted to sinus rhythm spontaneously but is now back in atrial fibrillation in the last 24 hours.  Assessment & Plan    1.  Paroxysmal to persistent atrial fibrillation with RVR.  Patient currently on high-dose intravenous diltiazem, heart rate is 120s to 130s.  CHADSVASC score is 1.  On IV heparin.  2.  Sepsis with Staphylococcus aureus bacteremia.  TEE negative for vegetations.  Management as per Infectious disease service.  Patient remains intermittently febrile with leukocytosis.  3.  Acute renal insufficiency, last creatinine was down to 1.36.  Continue IV diltiazem and heparin for now.  Plan to start oral  amiodarone load to use temporarily as she is likely to continue to have episodes of rapid atrial fibrillation during her acute illness.  Recent LFTs normal.  Will also eventually convert to oral DOAC from heparin since she continues to have  episodes of atrial fibrillation.  Signed, Rozann Lesches, MD  Aug 02, 2017, 8:01 AM

## 2017-08-11 NOTE — Progress Notes (Signed)
DR. Launa Flight NOTIFIED OF MRI RESULTS. NEW ORDERS RECEIVED. STOP HEPARIN GTT. VORBV.  HEPARIN GTT DISCONTINUED.

## 2017-08-11 NOTE — Progress Notes (Signed)
Patient currently in MRI.  Last fever yesterday, though has been on Tylenol.  Concern with AMS and getting MRI of brain.  No new positive cultures.  No changes at this time. Thayer Headings, MD

## 2017-08-11 NOTE — Progress Notes (Signed)
NEURO AND FAMILY AT BEDSIDE

## 2017-08-11 NOTE — Progress Notes (Signed)
PATIENT UNRESPONSIVE TO ALL STIMULATION WITH EXCEPTION OF PAINFUL STIMULI. DR. Wendee Beavers AT BEDSIDE.

## 2017-08-11 NOTE — Progress Notes (Addendum)
..   Name: Theresa Morrow MRN: [id number] DOB: [birth year]    ADMISSION DATE:  07/22/2017    REFERRING MD :  Maudie Mercury MD (Hospitalist)  REASON FOR CONSULT:  New onset Afib RVR in Septic pt, Hypotensive post Cardizem    62 yr old female with PMHx Acoustic neuroma s/p resection, osteoarthritis ( knees, hips and back), s/p Roux- En-Y Gastric Bypass, Anemia presents with lower back pain was found to be altered have a low BP. Admitted for sepsis by Hospitalist. PCCM consulted when pt became hypotensive after Cardizem was started for Afib RVR    HISTORY : per chart- admn   62 yr old female with PMHx acoustic neuroma s/p resection, osteoarthritis ( knees, hips and back), s/p Roux- En-Y Gastric Bypass, Anemia, GERD, h/o PUD presents with lower back pain was found to be altered have a low BP.  Pt states that she has been experiencing low back pain for the last week and it has been progressively worsening. She stated that it radiates up her back and she denies numbness and tingling in her extremities, she denies loss of urinary or bowel continence, she also denies fevers. She does report tenderness and pain when lying on her back.  She was evaluated by [facility] on 07/14/17 for the same back pain assoc with b/l leg pain (starting in the ankles migrating upwards) she states she has been fairly inactive with the back pain. Per the patient when it was at its worst it took her one hour to complete a 10 min walk.  She states that she tried to take a Vicodin which only achieved minimal relief of her pain. Also she was treated for a pneumonia several weeks ago. She denies any cough, chest pain, SOB or fever.   Admitted for sepsis by Hospitalist. PCCM consulted when pt became hypotensive after Cardizem was started for Afib RVR   Subjective: SIGNIFICANT EVENTS    Afib RVR  Acute decline  in mental status  On NRBMask  Called to intubate- see intubation note  Noted to be worsening with declining LOC.   PAST  MEDICAL HISTORY :   has a past medical history of Anemia, Bile duct stone, Cancer ([place]), Cholangitis, Choledocholithiasis, Chronic headaches, Complication of anesthesia, GERD (gastroesophageal reflux disease), Hepatomegaly, Kidney stones, Obesity, and Osteoarthritis.  has a past surgical history that includes Roux-en-Y Gastric Bypass (2011); Knee arthroscopy (Left, 2004); Cholecystectomy open (1977); Acoustic neuroma resection (2007); and Esophagogastroduodenoscopy (N/A, 08/15/2013). Prior to Admission medications   Medication Sig Start Date End Date Taking? Authorizing Provider  aspirin-acetaminophen-caffeine (EXCEDRIN MIGRAINE) [phone] MG tablet Take 2 tablets by mouth every 6 (six) hours as needed for headache.   Yes [provider]  calcium carbonate (TUMS - DOSED IN MG ELEMENTAL CALCIUM) 500 MG chewable tablet Chew 1 tablet by mouth as needed for indigestion or heartburn.   Yes [provider]  cephALEXin (KEFLEX) 500 MG capsule Take 1 capsule (500 mg total) by mouth 4 (four) times daily. 07/14/17  Yes Recardo Evangelist, PA-C  Cholecalciferol (VITAMIN D PO) Take 1 tablet by mouth daily.   Yes [provider]  HYDROcodone-acetaminophen (NORCO) 10-325 MG tablet Take 1 tablet by mouth every 6 (six) hours as needed for moderate pain.    Yes [provider]  ibuprofen (ADVIL,MOTRIN) 200 MG tablet Take 400 mg by mouth every 6 (six) hours as needed.   Yes [provider]  methocarbamol (ROBAXIN) 500 MG tablet Take 1 tablet (500 mg total) by mouth  2 (two) times daily. 07/11/17  Yes Varney Biles, MD  Multiple Vitamin (MULTI-VITAMINS) TABS Take 1 tablet by mouth daily.   Yes [provider]  naproxen (NAPROSYN) 375 MG tablet Take 1 tablet (375 mg total) by mouth 2 (two) times daily. 07/11/17  Yes Varney Biles, MD  oxyCODONE-acetaminophen (PERCOCET/ROXICET) 5-325 MG tablet Take 1 tablet by mouth every 4 (four) hours as needed for severe pain. 07/14/17   Yes Virgel Manifold, MD  pantoprazole (PROTONIX) 40 MG tablet TAKE 1 TABLET(40 MG) BY MOUTH DAILY 11/16/16  Yes Nandigam, Kavitha V, MD     . amiodarone  400 mg Oral BID  . aspirin  325 mg Oral Daily  . mouth rinse  15 mL Mouth Rinse BID  . metroNIDAZOLE  500 mg Oral Q8H  . pantoprazole  40 mg Oral Daily   vancomycin  No Known Allergies  FAMILY HISTORY:  family history includes Diabetes in her mother; Gallbladder disease in her maternal grandmother, mother, and sister; Heart disease in her mother; Kidney cancer in her father. SOCIAL HISTORY:  reports that she has never smoked. She has never used smokeless tobacco. She reports that she does not drink alcohol or use drugs.  REVIEW OF SYSTEMS: -change in mental status precludes ROS  SUBJECTIVE:   VITAL SIGNS: Temp:  [97.5 F (36.4 C)-101.8 F (38.8 C)] 97.5 F (36.4 C) (05/11 0800) Pulse Rate:  [92-154] 131 (05/11 0700) Resp:  [13-22] 22 (05/11 0717) BP: (96-138)/(66-85) 118/85 (05/11 0717) SpO2:  [92 %-100 %] 100 % (05/11 1230) FiO2 (%):  [100 %] 100 % (05/11 1230)  PHYSICAL EXAMINATION: General:  Well nourished female, obtunded Neuro:  Unresponsive HEENT:  Pupils equal Cardiovascular:  Irregularly irregular S1 and S2 Lungs:  Bilateral coarse breath sounds Abdomen:  Soft not distended with + BS Musculoskeletal:  B/l lower ext swelling , erythema, edema UE Skin:   Cellulitic changes in legs  Recent Labs  Lab 07/25/2017 2248  07/18/17 0311  07/20/17 0258 25-Jul-2017 0338 2017-07-25 1047  NA 138  --  138  --   --   --  136  K 3.8  --  4.3  --   --   --  3.8  CL 111  --  111  --   --   --  107  CO2 17*  --  17*  --   --   --  16*  BUN 56*  --  53*  --   --   --  14  CREATININE 1.83*   < > 1.36*   < > 0.68 0.64 0.66  GLUCOSE 148*  --  129*  --   --   --  164*   < > = values in this interval not displayed.   Recent Labs  Lab 07/31/2017 0418 07/20/17 0258 25-Jul-2017 0338  HGB 10.1* 10.4* 10.0*  HCT 30.5* 32.3* 30.8*  WBC  14.8* 14.2* 20.6*  PLT 156 212 257   Dg Chest Port 1 View  Result Date: 2017-07-25 CLINICAL DATA:  Intubation EXAM: PORTABLE CHEST 1 VIEW COMPARISON:  Portable exam 1212 hours compared to 07/12/2017 FINDINGS: Tip of endotracheal tube projects 3.4 cm above carina. Nasogastric tube extends into stomach with proximal side-port at approximately the gastroesophageal junction. Rotated to the LEFT. Normal heart size and mediastinal contours. Bibasilar opacities which could reflect atelectasis or infiltrate, increased since previous exam. Peribronchial thickening identified. Upper lungs clear. No gross pleural effusion or pneumothorax. IMPRESSION: Increased bibasilar opacities which could represent atelectasis or  infiltrate. Electronically Signed   By: Lavonia Dana M.D.   On: 07-24-17 12:47   Dg Chest Port 1v Same Day  Result Date: 07/27/2017 CLINICAL DATA:  MRSA bacteremia and septic shock.  Unknown source. EXAM: PORTABLE CHEST 1 VIEW COMPARISON:  08/09/2017 CXR FINDINGS: Heart and mediastinal contours are stable with slightly uncoiled appearance of the nonaneurysmal thoracic aorta. Slight increase in left basilar atelectasis since prior. No alveolar consolidation or effusion. No pneumothorax. No acute osseous abnormality. IMPRESSION: Slight increase in left basilar atelectasis. Otherwise, no significant change is identified. Electronically Signed   By: Ashley Royalty M.D.   On: 07/29/2017 20:09    ASSESSMENT / PLAN: 62 yr old female with PMHx Acoustic neuroma s/p resection, osteoarthritis ( knees, hips and back), s/p Roux- En-Y Gastric Bypass, Anemia, GERD, h/o PUD  Admitted by Hospitalist for Sepsis  presenting with lower back pain secondary to degenerative disease       Neurology Neuro plans MRI brain ?infection  ?vasculitis   ?embolization Propofol for sedation  Goal RASS -2  Cardiology    Afib RVR received Cardizem  heparin drip for  A fib    Pulmonary   Electively intubated for airway  protection F/u ABG Vent protocol   GI /nutrition  Npo for today PPI for PUD prophlxs   Renal, fluids, electrolytes Monitor lytes, renal function  Hematology Heparin iv covers dvt prophlx  Endocrinology Monitor sugars  Infectious diseases On vanco ID f/u  Lines -Devices ETT. OGT -5/11   Counseling    Family:  Husband updated      Code status: full    Thank you for letting me participate in the care of your patient.  ^^^^^^^^^^ I  Have personally spent   50   Minutes  In the care of this Patient providing Critical care Services; Time includes review of chart, labs, imaging, coordinating care with other physicians and healthcare team members. Also includes time for frequent reevaluation and additional treatment implementation due to change in clinical condiiton of patient. Excludes time spent for Procedure and Teaching.   ^^^^^^^^^^  Note subject to typographical and grammatical errors;   Any formal questions or concerns about the content, text, or information contained within the body of this dictation should be directly addressed to the physician  for  clarification.   Evans Lance, MD Pulmonary and Critical Care Medicine [place] [facility] Pager: [phone]   Addendum: Notified by Radiology & IM that pt has significant intraparenchymal bleed and herniation. Neurosurgery consulted. Prognosis? Dr Lamonte Sakai aware. SPS 2017/07/24   4pm

## 2017-08-11 NOTE — Progress Notes (Signed)
TRANSPORTING BACK TO UNIT.

## 2017-08-11 NOTE — Progress Notes (Signed)
Pt HR increased to 140-160s. 12 lead performed and confirms pt to be in Afib with RVR. Vitals as see below. Cardiology and [place] oncall notjfjed Orders received to start pt on cardizem gtt. Will continue to monitor.  Today's Vitals   Jul 29, 2017 0424 2017/07/29 0540 Jul 29, 2017 0648 Jul 29, 2017 0717  BP:  112/85 96/66 118/85  Pulse:  (!) 154 (!) 139   Resp:  15 18 (!) 22  Temp:      TempSrc:      SpO2:  97% 97% 96%  Weight:      Height:      PainSc: Asleep

## 2017-08-11 NOTE — Progress Notes (Signed)
Patient ID: Theresa Morrow, female   DOB: 1956/01/16, 62 y.o.   MRN: [id number]                                                                PROGRESS NOTE                                                                                                                                                                                                             Patient Demographics:    Theresa Morrow, is a 62 y.o. female, DOB - 11-24-1955, HER:[id number]  Admit date - 07/18/2017   Admitting Physician Jani Gravel, MD  Outpatient Primary MD for the patient is [place], [facility]  Outpatient Specialists:     Chief Complaint  Patient presents with  . Hypotension  . Altered Mental Status  . Hallucinations       Brief Narrative  62 y.o. female, w brain tumor in 2007, chronic headache, gerd,  c/o lower back without radiation.  Denies numbness, tingling, weakness, incontinence.  Started Tuesday ( 4/30.2019) while at work, no hx of trauma.  Pt couldn't walk more than 100 feet. Presented to ED on 5/1 and had xray.  5/4 had CT scan L spine.  Pt was told to go to ED due to low bp and altered mental status. Glucose was low initially at 58    Subjective:    Theresa Morrow  Is more somnolent this morning according to family. Yesterday was conversing with family. This morning went into afib with rvr and was placed on cardizem.   Assessment  & Plan :    Principal Problem:   ARF (acute renal failure) (HCC) Active Problems:   Atrial fibrillation with RVR (HCC)   Sepsis (HCC)   UTI (urinary tract infection)   CAP (community acquired pneumonia)   AKI (acute kidney injury) ([place])   Lactic acidosis   Lumbar back pain   MRSA bacteremia   FUO (fever of unknown origin)   Streptococcus pneumoniae    Sepsis (tachy, hypotension, elevation in LA) ddx HCAP vs UTI - Pt with S aureus bacteremia. ID on board and assisting with work up. Antibiotics per ID.  - TEE negative for source  or vegetation - Ortho consulted and no joint aspiration recommended  Hypersomnolence - Pt almost with agonal breathing. sats in the upper 80's, d/c nursing and discussed adding supplemental oxygen. Administered Naloxone with no improvement in condition. Discontinued robaxone even though last dose was yesterday. - Will place stat order for CMP, magnesium, phosphorus - Also will check blood glucose.  - Pt has been hypersomnolent this admission which is worrisome. Has had new onset afib but is on heparin infusion. Will contact neurology for further evaluation and recommnedations.  Afib with RVR - Cardiology on board currently and managing. Pt is s/p cardioversion but this am converted to afib. On cardizem gtt - anticoagulated on Heparin   ARF - improved with rehydration, as such suspecting prerenal etiology  Hypoglycemia - Had history of this. Will reassess blood sugar levels.  Back pain - MRI L spine  Gerd - Cont PPI   Code Status : FULL CODE  Family Communication  : w patient and family at bedside.  Disposition Plan  : home  Barriers For Discharge : afib with rvr requiring IV medication, overwhelming infection requiring IV antibiotics  Consults  :  Cardiology, ID  Procedures  : TEE pending  DVT Prophylaxis  :  Pt on heparin  Lab Results  Component Value Date   PLT 257 08/19/17    Antibiotics  :  Vancomycin  Anti-infectives (From admission, onward)   Start     Dose/Rate Route Frequency Ordered Stop   07/20/17 1700  metroNIDAZOLE (FLAGYL) tablet 500 mg     500 mg Oral Every 8 hours 07/20/17 1649     07/23/2017 2200  vancomycin (VANCOCIN) IVPB 750 mg/150 ml premix     750 mg 150 mL/hr over 60 Minutes Intravenous Every 12 hours 08/08/2017 1308     07/18/17 2000  vancomycin (VANCOCIN) IVPB 1000 mg/200 mL premix  Status:  Discontinued     1,000 mg 200 mL/hr over 60 Minutes Intravenous Every 48 hours 07/15/2017 1931 07/17/17 1154   07/18/17 1200  vancomycin  (VANCOCIN) 1,250 mg in sodium chloride 0.9 % 250 mL IVPB  Status:  Discontinued     1,250 mg 166.7 mL/hr over 90 Minutes Intravenous Every 36 hours 07/18/17 0728 07/30/2017 1308   07/17/17 1400  ceFEPIme (MAXIPIME) 1 g in sodium chloride 0.9 % 100 mL IVPB  Status:  Discontinued     1 g 200 mL/hr over 30 Minutes Intravenous Every 24 hours 07/20/2017 1931 07/17/17 0901   07/17/17 1200  vancomycin (VANCOCIN) IVPB 1000 mg/200 mL premix  Status:  Discontinued     1,000 mg 200 mL/hr over 60 Minutes Intravenous Every 36 hours 07/17/17 1154 07/18/17 0728   07/17/17 1000  ceFEPIme (MAXIPIME) 1 g in sodium chloride 0.9 % 100 mL IVPB  Status:  Discontinued     1 g 200 mL/hr over 30 Minutes Intravenous Every 12 hours 07/17/17 0901 07/17/17 1149   07/12/2017 2000  vancomycin (VANCOCIN) 500 mg in sodium chloride 0.9 % 100 mL IVPB     500 mg 100 mL/hr over 60 Minutes Intravenous  Once 07/18/2017 1931 07/25/2017 2220   07/25/2017 1900  cefTRIAXone (ROCEPHIN) 1 g in sodium chloride 0.9 % 100 mL IVPB  Status:  Discontinued     1 g 200 mL/hr over 30 Minutes Intravenous Every 24 hours 07/15/2017 1849 07/27/2017 1901   07/22/2017 1900  azithromycin (ZITHROMAX) 500 mg in sodium chloride 0.9 % 250 mL IVPB  Status:  Discontinued     500 mg 250 mL/hr over 60 Minutes Intravenous Every 24 hours 07/24/2017 1849  07/29/2017 1901   08/03/2017 1300  piperacillin-tazobactam (ZOSYN) IVPB 3.375 g  Status:  Discontinued     3.375 g 100 mL/hr over 30 Minutes Intravenous  Once 08/03/2017 1245 07/20/2017 1257   07/15/2017 1300  vancomycin (VANCOCIN) IVPB 1000 mg/200 mL premix     1,000 mg 200 mL/hr over 60 Minutes Intravenous  Once 07/28/2017 1245 07/17/2017 1424   08/09/2017 1300  ceFEPIme (MAXIPIME) 2 g in sodium chloride 0.9 % 100 mL IVPB     2 g 200 mL/hr over 30 Minutes Intravenous  Once 07/22/2017 1300 08/01/2017 1346        Objective:   Vitals:   30-Jul-2017 0540 Jul 30, 2017 0648 2017-07-30 0717 2017/07/30 0800  BP: 112/85 96/66 118/85   Pulse: (!) 154 (!)  139    Resp: 15 18 (!) 22   Temp:    (!) 97.5 F (36.4 C)  TempSrc:    Oral  SpO2: 97% 97% 96%   Weight:      Height:        Wt Readings from Last 3 Encounters:  08/06/2017 82.6 kg (182 lb)  05/18/16 86.4 kg (190 lb 6 oz)  12/23/15 83.5 kg (184 lb)     Intake/Output Summary (Last 24 hours) at 2017-07-30 1030 Last data filed at 30-Jul-2017 0647 Gross per 24 hour  Intake 1151.98 ml  Output 1325 ml  Net -173.02 ml     Physical Exam  GEN: Pt is somnolent, difficult to arouseL Supple Neck,No JVD, No cervical lymphadenopathy  Pulm: equal chest rise, Good air movement bilaterally, CTAB, pulse ox off oxygen 88-89% Irr, irr, s1, s2,  Abdomen: + B.Sounds, Abd Soft, No tenderness, No organomegaly appriciated, No rebound - guarding or rigidity. SKIN: No Cyanosis, Clubbing or edema, Cellulitis lower extremities BL   Data Review:    CBC Recent Labs  Lab 07/14/17 1940  08/06/2017 2248 07/17/17 0443 07/18/17 0311 07/17/2017 0418 07/20/17 0258 30-Jul-2017 0338  WBC 2.2*   < > 23.7* 25.2* 28.4* 14.8* 14.2* 20.6*  HGB 12.6   < > 11.6* 11.0* 10.8* 10.1* 10.4* 10.0*  HCT 38.9   < > 35.3* 33.7* 32.6* 30.5* 32.3* 30.8*  PLT 119*   < > 129* 122* 144* 156 212 257  MCV 88.2   < > 84.7 84.7 84.7 84.7 85.9 85.6  MCH 28.6   < > 27.8 27.6 28.1 28.1 27.7 27.8  MCHC 32.4   < > 32.9 32.6 33.1 33.1 32.2 32.5  RDW 15.8*   < > 16.4* 16.3* 16.7* 16.4* 16.5* 16.3*  LYMPHSABS 0.5*  --  1.4  --   --   --   --   --   MONOABS 0.5  --  0.5  --   --   --   --   --   EOSABS 0.0  --  0.0  --   --   --   --   --   BASOSABS 0.0  --  0.0  --   --   --   --   --    < > = values in this interval not displayed.    Chemistries  Recent Labs  Lab 07/14/17 1940 08/02/2017 1238  07/17/2017 2248 07/17/17 0443 07/18/17 0311 07/30/2017 1211 07/20/17 0258 Jul 30, 2017 0338  NA 141 138  --  138  --  138  --   --   --   K 3.7 4.0  --  3.8  --  4.3  --   --   --  CL 109 104  --  111  --  111  --   --   --   CO2 21* 18*  --   17*  --  17*  --   --   --   GLUCOSE 94 72  --  148*  --  129*  --   --   --   BUN 40* 63*  --  56*  --  53*  --   --   --   CREATININE 1.40* 2.56*   < > 1.83* 1.72* 1.36* 0.82 0.68 0.64  CALCIUM 8.5* 8.5*  --  7.7*  --  7.8*  --   --   --   MG  --   --   --  1.9  --   --   --   --   --   AST  --  72*  --  49*  --  30  --   --   --   ALT  --  39  --  32  --  26  --   --   --   ALKPHOS  --  161*  --  129*  --  153*  --   --   --   BILITOT  --  1.2  --  1.1  --  0.8  --   --   --    < > = values in this interval not displayed.   ------------------------------------------------------------------------------------------------------------------ No results for input(s): CHOL, HDL, LDLCALC, TRIG, CHOLHDL, LDLDIRECT in the last 72 hours.  No results found for: HGBA1C ------------------------------------------------------------------------------------------------------------------ No results for input(s): TSH, T4TOTAL, T3FREE, THYROIDAB in the last 72 hours.  Invalid input(s): FREET3 ------------------------------------------------------------------------------------------------------------------ No results for input(s): VITAMINB12, FOLATE, FERRITIN, TIBC, IRON, RETICCTPCT in the last 72 hours.  Coagulation profile Recent Labs  Lab 08/06/2017 2248  INR 1.13    No results for input(s): DDIMER in the last 72 hours.  Cardiac Enzymes Recent Labs  Lab 07/12/2017 1733 08/01/2017 2032 08/05/2017 2248 07/17/17 0443  CKMB  --  10.4*  --   --   TROPONINI <0.03  --  0.03* 0.03*   ------------------------------------------------------------------------------------------------------------------ No results found for: BNP  Inpatient Medications  Scheduled Meds: . amiodarone  400 mg Oral BID  . aspirin  325 mg Oral Daily  . mouth rinse  15 mL Mouth Rinse BID  . metroNIDAZOLE  500 mg Oral Q8H  . pantoprazole  40 mg Oral Daily   Continuous Infusions: . sodium chloride 20 mL/hr at 07/20/17 0600    . sodium chloride Stopped (07/22/2017 2150)  . diltiazem (CARDIZEM) infusion 20 mg/hr (07-25-2017 0835)  . heparin 2,300 Units/hr (07/25/2017 0347)  . sodium chloride    . vancomycin Stopped (07/20/17 2207)   PRN Meds:.acetaminophen  Micro Results Recent Results (from the past 240 hour(s))  Blood Culture (routine x 2)     Status: Abnormal   Collection Time: 08/04/2017  1:26 PM  Result Value Ref Range Status   Specimen Description   Final    RIGHT ANTECUBITAL Performed at [facility] [address], [place], [place] [address]    Special Requests   Final    BOTTLES DRAWN AEROBIC AND ANAEROBIC Blood Culture adequate volume Performed at [facility] [address], [place], [place] [address]    Culture  Setup Time   Final    GRAM POSITIVE COCCI IN BOTH AEROBIC AND ANAEROBIC BOTTLES CRITICAL RESULT CALLED TO, READ BACK BY AND VERIFIED  WITH: Chales Abrahams PharmD 11:10 07/17/17 ([email or web address]) Performed at [facility], [place] [address], [place], [place] [address]    Culture METHICILLIN RESISTANT STAPHYLOCOCCUS AUREUS (A)  Final   Report Status 07/31/2017 FINAL  Final   Organism ID, Bacteria METHICILLIN RESISTANT STAPHYLOCOCCUS AUREUS  Final      Susceptibility   Methicillin resistant staphylococcus aureus - MIC*    CIPROFLOXACIN >=8 RESISTANT Resistant     ERYTHROMYCIN >=8 RESISTANT Resistant     GENTAMICIN <=0.5 SENSITIVE Sensitive     OXACILLIN >=4 RESISTANT Resistant     TETRACYCLINE <=1 SENSITIVE Sensitive     VANCOMYCIN 1 SENSITIVE Sensitive     TRIMETH/SULFA <=10 SENSITIVE Sensitive     CLINDAMYCIN <=0.25 SENSITIVE Sensitive     RIFAMPIN <=0.5 SENSITIVE Sensitive     Inducible Clindamycin NEGATIVE Sensitive     * METHICILLIN RESISTANT STAPHYLOCOCCUS AUREUS  Blood Culture (routine x 2)     Status: None (Preliminary result)   Collection Time: 07/15/2017  1:26 PM  Result Value Ref Range Status   Specimen Description   Final    LEFT  ANTECUBITAL Performed at [facility] [address] [place], [place] [address]    Special Requests   Final    BOTTLES DRAWN AEROBIC AND ANAEROBIC Blood Culture results may not be optimal due to an excessive volume of blood received in culture bottles Performed at [facility] [address], [place], [place] [address]    Culture   Final    NO GROWTH 4 DAYS Performed at [facility], [place] [address], [place], [place] [address]    Report Status PENDING  Incomplete  Urine culture     Status: None   Collection Time: 08/02/2017  1:26 PM  Result Value Ref Range Status   Specimen Description   Final    URINE, CLEAN CATCH Performed at [facility], [place] [address] [place], [place] [address]    Special Requests   Final    Normal Performed at [facility], [place] [address], [place], [place] [address]    Culture   Final    NO GROWTH Performed at [facility], [place] [address] [place], [place] [address]    Report Status 07/18/2017 FINAL  Final  Blood Culture ID Panel (Reflexed)     Status: Abnormal   Collection Time: 07/27/2017  1:26 PM  Result Value Ref Range Status   Enterococcus species NOT DETECTED NOT DETECTED Final   Listeria monocytogenes NOT DETECTED NOT DETECTED Final   Staphylococcus species DETECTED (A) NOT DETECTED Final    Comment: CRITICAL RESULT CALLED TO, READ BACK BY AND VERIFIED WITH: Chales Abrahams PharmD 11:10 07/17/17 ([email or web address])    Staphylococcus aureus DETECTED (A) NOT DETECTED Final    Comment: Methicillin (oxacillin)-resistant Staphylococcus aureus (MRSA). MRSA is predictably resistant to beta-lactam antibiotics (except ceftaroline). Preferred therapy is vancomycin unless clinically contraindicated. Patient requires contact precautions if  hospitalized. CRITICAL RESULT CALLED TO, READ BACK BY AND VERIFIED WITH: Chales Abrahams PharmD 11:10 07/17/17 ([email or web address])    Methicillin resistance DETECTED (A)  NOT DETECTED Final    Comment: CRITICAL RESULT CALLED TO, READ BACK BY AND VERIFIED WITH: Chales Abrahams PharmD 11:10 07/17/17 ([email or web address])    Streptococcus species NOT DETECTED NOT DETECTED Final   Streptococcus agalactiae NOT DETECTED NOT DETECTED Final   Streptococcus pneumoniae NOT DETECTED NOT DETECTED Final   Streptococcus pyogenes NOT DETECTED NOT DETECTED Final   Acinetobacter baumannii NOT  DETECTED NOT DETECTED Final   Enterobacteriaceae species NOT DETECTED NOT DETECTED Final   Enterobacter cloacae complex NOT DETECTED NOT DETECTED Final   Escherichia coli NOT DETECTED NOT DETECTED Final   Klebsiella oxytoca NOT DETECTED NOT DETECTED Final   Klebsiella pneumoniae NOT DETECTED NOT DETECTED Final   Proteus species NOT DETECTED NOT DETECTED Final   Serratia marcescens NOT DETECTED NOT DETECTED Final   Haemophilus influenzae NOT DETECTED NOT DETECTED Final   Neisseria meningitidis NOT DETECTED NOT DETECTED Final   Pseudomonas aeruginosa NOT DETECTED NOT DETECTED Final   Candida albicans NOT DETECTED NOT DETECTED Final   Candida glabrata NOT DETECTED NOT DETECTED Final   Candida krusei NOT DETECTED NOT DETECTED Final   Candida parapsilosis NOT DETECTED NOT DETECTED Final   Candida tropicalis NOT DETECTED NOT DETECTED Final    Comment: Performed at [facility], [place] [address] [place], [place] [address]  Culture, blood (routine x 2) Call MD if unable to obtain prior to antibiotics being given     Status: None (Preliminary result)   Collection Time: 08/04/2017  8:35 PM  Result Value Ref Range Status   Specimen Description   Final    BLOOD LEFT ANTECUBITAL Performed at [facility] [address], [place], [place] [address]    Special Requests   Final    BOTTLES DRAWN AEROBIC AND ANAEROBIC Blood Culture adequate volume Performed at [facility] [address], [place], [place] [address]    Culture   Final    NO GROWTH 4 DAYS Performed at [facility], [place] [address] [place], [place] [address]    Report Status PENDING  Incomplete  Culture, blood (routine x 2) Call MD if unable to obtain prior to antibiotics being given     Status: None (Preliminary result)   Collection Time: 08/01/2017  9:00 PM  Result Value Ref Range Status   Specimen Description   Final    BLOOD LEFT ARM Performed at [facility] [address] [place], [place] [address]    Special Requests   Final    BOTTLES DRAWN AEROBIC AND ANAEROBIC Blood Culture adequate volume Performed at [facility] [address] [place], [place] [address]    Culture   Final    NO GROWTH 4 DAYS Performed at [facility], [place] [address] [place], [place] [address]    Report Status PENDING  Incomplete  Respiratory Panel by PCR     Status: None   Collection Time: 07/15/2017  9:42 PM  Result Value Ref Range Status   Adenovirus NOT DETECTED NOT DETECTED Final   Coronavirus 229E NOT DETECTED NOT DETECTED Final   Coronavirus HKU1 NOT DETECTED NOT DETECTED Final   Coronavirus NL63 NOT DETECTED NOT DETECTED Final   Coronavirus OC43 NOT DETECTED NOT DETECTED Final   Metapneumovirus NOT DETECTED NOT DETECTED Final   Rhinovirus / Enterovirus NOT DETECTED NOT DETECTED Final   Influenza A NOT DETECTED NOT DETECTED Final   Influenza B NOT DETECTED NOT DETECTED Final   Parainfluenza Virus 1 NOT DETECTED NOT DETECTED Final   Parainfluenza Virus 2 NOT DETECTED NOT DETECTED Final   Parainfluenza Virus 3 NOT DETECTED NOT DETECTED Final   Parainfluenza Virus 4 NOT DETECTED NOT DETECTED Final   Respiratory Syncytial Virus NOT DETECTED NOT DETECTED Final   Bordetella pertussis NOT DETECTED NOT DETECTED Final   Chlamydophila pneumoniae NOT DETECTED NOT DETECTED Final   Mycoplasma  pneumoniae NOT DETECTED NOT DETECTED Final    Comment: Performed at [facility], [place] [address] [place], [place] [address]  MRSA PCR Screening     Status: None    Collection Time: 07/20/2017  9:43 PM  Result Value Ref Range Status   MRSA by PCR NEGATIVE NEGATIVE Final    Comment:        The GeneXpert MRSA Assay (FDA approved for NASAL specimens only), is one component of a comprehensive MRSA colonization surveillance program. It is not intended to diagnose MRSA infection nor to guide or monitor treatment for MRSA infections. Performed at [facility], [place] [address] [place], [place] [address]   Culture, blood (Routine X 2) w Reflex to ID Panel     Status: None (Preliminary result)   Collection Time: 07/17/17 12:06 PM  Result Value Ref Range Status   Specimen Description   Final    BLOOD RIGHT HAND Performed at [facility] [address] [place], [place] [address]    Special Requests   Final    BOTTLES DRAWN AEROBIC AND ANAEROBIC Blood Culture adequate volume Performed at [facility] [address], [place], [place] [address]    Culture   Final    NO GROWTH 3 DAYS Performed at [facility], [place] [address], [place], [place] [address]    Report Status PENDING  Incomplete  Culture, blood (Routine X 2) w Reflex to ID Panel     Status: None (Preliminary result)   Collection Time: 07/17/17 12:07 PM  Result Value Ref Range Status   Specimen Description   Final    BLOOD LEFT HAND Performed at [facility] [address] [place], [place] [address]    Special Requests   Final    BOTTLES DRAWN AEROBIC ONLY Blood Culture adequate volume Performed at [facility] [address] [place], [place] [address]    Culture   Final    NO GROWTH 3 DAYS Performed at [facility], [place] [address] [place], [place] [address]    Report Status PENDING  Incomplete    Radiology Reports Ct Abdomen Pelvis Wo Contrast  Result Date: 07/30/2017 CLINICAL DATA:  Hypotension and abdominal pain EXAM: CT ABDOMEN AND PELVIS WITHOUT CONTRAST TECHNIQUE:  Multidetector CT imaging of the abdomen and pelvis was performed following the standard protocol without IV contrast. COMPARISON:  None. FINDINGS: Lower chest: Lung bases are well aerated with the exception of mild lingular infiltrate adjacent to the heart. Hepatobiliary: No focal liver abnormality is seen. Status post cholecystectomy. No biliary dilatation. Pancreas: Unremarkable. No pancreatic ductal dilatation or surrounding inflammatory changes. Spleen: Normal in size without focal abnormality. Adrenals/Urinary Tract: The adrenals are within normal limits. Small nonobstructing stones are noted within the left kidney. The collecting systems are otherwise within normal limits. The bladder is partially decompressed. Stomach/Bowel: Postsurgical changes are noted in the stomach. A sliding-type hiatal hernia is noted. 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 The appendix is within normal limits. Vascular/Lymphatic: No significant vascular findings are present. No enlarged abdominal or pelvic lymph nodes. Reproductive: Uterus and bilateral adnexa are unremarkable. Other: No abdominal wall hernia or abnormality. No abdominopelvic ascites. Musculoskeletal: Degenerative changes of lumbar spine are noted. IMPRESSION: Nonobstructing left renal stones. Postoperative changes as described. Mild lingular infiltrate. Electronically Signed   By: Inez Catalina M.D.   On: 07/13/2017 15:49   Dg Lumbar Spine Complete  Result  Date: 07/11/2017 CLINICAL DATA:  Low back pain into hips for 2 days, fell 1 week ago EXAM: LUMBAR SPINE - COMPLETE 4+ VIEW COMPARISON:  CT abdomen and pelvis 11/17/2010 FINDINGS: Osseous demineralization. Five non-rib-bearing lumbar vertebra. Minimal biconvex thoracolumbar scoliosis. Multilevel degenerative disc disease changes with disc space narrowing and endplate spur formation. Facet degenerative changes lower lumbar spine. Vertebral body heights maintained without fracture or bone  destruction. Minimal anterolisthesis at L4-L5 and retrolisthesis at L2-L3 noted. No definite spondylolysis. SI joints preserved. IMPRESSION: Osseous demineralization with degenerative disc and facet disease changes of the lumbar spine with associated biconvex thoracolumbar scoliosis and minimal listhesis. No acute abnormalities. Electronically Signed   By: Lavonia Dana M.D.   On: 07/11/2017 15:05   Ct Head Wo Contrast  Result Date: 08/02/2017 CLINICAL DATA:  Altered level of consciousness over the last 2 days, fatigue, hypotension EXAM: CT HEAD WITHOUT CONTRAST TECHNIQUE: Contiguous axial images were obtained from the base of the skull through the vertex without intravenous contrast. COMPARISON:  MR brain scan of 05/12/2014 FINDINGS: Brain: The ventricular system is normal in size and configuration and the septum is in a normal midline position. The fourth ventricle and basilar cisterns are unremarkable. No hemorrhage, mass lesion, or acute infarction is seen. 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 Vascular: No vascular abnormality is seen on this unenhanced study. Skull: On bone window images, changes of prior left mastoidectomy are noted. No acute calvarial abnormality is seen. Sinuses/Orbits: The paranasal sinuses appear well pneumatized. Other: None. IMPRESSION: 1. No acute intracranial abnormality. 2. 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 Electronically Signed   By: Ivar Drape M.D.   On: 07/30/2017 15:45   Ct Lumbar Spine Wo Contrast  Result Date: 07/14/2017 CLINICAL DATA:  Initial evaluation for low back pain with bilateral leg pain. EXAM: CT LUMBAR SPINE WITHOUT CONTRAST TECHNIQUE: Multidetector CT imaging of the lumbar spine was performed without intravenous contrast administration. Multiplanar CT image reconstructions were also generated. COMPARISON:  Prior radiograph 07/11/2017. FINDINGS: Segmentation: Normal segmentation. Lowest  well-formed disc labeled the L5-S1 level. Alignment: 3 mm retrolisthesis of L2 on L3, with up to 4 mm retrolisthesis of L4 on L5. Findings are likely chronic and facet mediated. Mild levoscoliosis, apex at L3. Vertebrae: Vertebral body heights maintained without evidence for acute or chronic fracture. Chronic reactive endplate changes present about the right aspect of the L3-4 interspace due to scoliotic curvature. No discrete lytic or blastic osseous lesions. Visualized sacrum and pelvis unremarkable. Paraspinal and other soft tissues: No acute paraspinous soft tissue abnormality. Patient status post gastric bypass. Punctate nonobstructive left renal nephrolithiasis. Disc levels: L1-2: Mild diffuse disc bulge, asymmetric to the right. Mild bilateral facet hypertrophy. No significant canal stenosis. Mild right L1 foraminal narrowing. L2-3: Retrolisthesis. Chronic intervertebral disc space narrowing with disc bulge. Mild facet hypertrophy. Resultant mild canal with moderate bilateral L2 foraminal stenosis. L3-4: Chronic intervertebral disc space narrowing with diffuse disc bulge. Associated disc desiccation noted. Moderate facet hypertrophy. Resultant moderate canal with bilateral lateral recess narrowing. Severe right with moderate left L3 foraminal stenosis. L4-5: Anterolisthesis. Diffuse disc bulge, slightly asymmetric to the right. Severe facet arthropathy. Resultant severe canal with bilateral subarticular stenosis. Moderate bilateral L4 foraminal narrowing, slightly worse on the left. L5-S1: Diffuse disc bulge with intervertebral disc space narrowing. Severe left with moderate right facet arthrosis. Resultant mild left lateral recess narrowing without significant canal stenosis. Moderate left L5 foraminal narrowing. No significant right  foraminal encroachment. IMPRESSION: 1. No acute abnormality within the lumbar spine. 2. Levoscoliosis with multilevel degenerative spondylolysis with resultant multilevel canal  narrowing as above, moderate at L3-4 and severe at L4-5. 3. Multifactorial degenerative changes with resultant multilevel foraminal narrowing as above. 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 4. Punctate nonobstructive left renal nephrolithiasis. Electronically Signed   By: Jeannine Boga M.D.   On: 07/14/2017 20:24   Mr Brain Wo Contrast  Result Date: 07/17/2017 CLINICAL DATA:  Worsening mental status. 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 EXAM: MRI HEAD WITHOUT CONTRAST TECHNIQUE: Multiplanar, multiecho pulse sequences of the brain and surrounding structures were obtained without intravenous contrast. COMPARISON:  Head CT 08/09/2017.  MRI 05/12/2014. FINDINGS: Brain: Diffusion imaging does not show any acute or subacute infarction. There has been previous left mastoidectomy which appears the same as on previous exams. No evidence of any abnormality of the brainstem, cerebellum or cerebral hemispheres. No old infarction, mass lesion, hemorrhage, hydrocephalus or extra-axial collection. Vascular: Major vessels at the base of the brain show flow. Skull and upper cervical spine: Otherwise negative. Sinuses/Orbits: Clear/normal Other: None IMPRESSION: No acute or reversible finding. No abnormality seen to explain worsening mental status. Distant left mastoidectomy without complicating feature. Electronically Signed   By: Nelson Chimes M.D.   On: 07/17/2017 07:01   Mr Thoracic Spine Wo Contrast  Result Date: 07/17/2017 CLINICAL DATA:  Altered mental status. Sepsis with elevated white blood cell count. Possible meningitis. EXAM: MRI THORACIC AND LUMBAR SPINE WITHOUT CONTRAST TECHNIQUE: Multiplanar and multiecho pulse sequences of the thoracic and lumbar spine were obtained without intravenous contrast. COMPARISON:  CT abdomen  and pelvis 08/01/2017. FINDINGS: MRI THORACIC SPINE FINDINGS Alignment: There is exaggeration of the normal thoracic kyphosis. No listhesis. Vertebrae: Vertebral body height and signal are maintained. No evidence of discitis or osteomyelitis. No fracture or worrisome lesion. Cord:  Normal signal throughout. Paraspinal and other soft tissues: A 0.8 cm nodular opacity is seen posteriorly in the right lung on image 12 of series 8. Small bilateral pleural effusions are identified. The patient also has a small hiatal hernia. Disc levels: The central spinal canal and neural foramina are patent at all levels. Small central disc protrusion at T7-8 is noted. MRI LUMBAR SPINE FINDINGS Segmentation:  Standard. Alignment: Trace retrolisthesis L2 on L3 is identified. There is convex left scoliosis. Vertebrae: No fracture or worrisome lesion. No evidence of infectious process. Conus medullaris and cauda equina: Conus extends to the L1-2 level. Conus and cauda equina appear normal. Paraspinal and other soft tissues: Negative. Disc levels: L1-2: Minimal disc bulge without stenosis. L2-3: Shallow disc bulge is identified. Loss of disc space height is noted. The central canal is mildly narrowed. The foramina are open. L3-4: There is loss of disc space height, a shallow disc bulge and vacuum disc phenomenon. The central canal and foramina remain open. L4-5: Bilateral facet degenerative change with associated small effusions. Shallow disc bulge. The central canal and foramina are open. L5-S1: Shallow disc bulge without stenosis. IMPRESSION: MR THORACIC SPINE IMPRESSION No acute abnormality.  Negative for source of infection. 0.7 cm nodular opacity in the right lung cannot be definitively characterized. Recommend CT chest in 6 months to ensure stability. MR LUMBAR SPINE IMPRESSION No acute abnormality.  Negative for source of infection. Mild central canal narrowing L2-3 due to a shallow disc bulge. Scattered facet arthropathy  appearing  worst at L4-5. Electronically Signed   By: Inge Rise M.D.   On: 07/17/2017 08:06   Mr Lumbar Spine Wo Contrast  Result Date: 07/17/2017 CLINICAL DATA:  Altered mental status. Sepsis with elevated white blood cell count. Possible meningitis. EXAM: MRI THORACIC AND LUMBAR SPINE WITHOUT CONTRAST TECHNIQUE: Multiplanar and multiecho pulse sequences of the thoracic and lumbar spine were obtained without intravenous contrast. COMPARISON:  CT abdomen and pelvis 07/17/2017. FINDINGS: MRI THORACIC SPINE FINDINGS Alignment: There is exaggeration of the normal thoracic kyphosis. No listhesis. Vertebrae: Vertebral body height and signal are maintained. No evidence of discitis or osteomyelitis. No fracture or worrisome lesion. Cord:  Normal signal throughout. Paraspinal and other soft tissues: A 0.8 cm nodular opacity is seen posteriorly in the right lung on image 12 of series 8. Small bilateral pleural effusions are identified. The patient also has a small hiatal hernia. Disc levels: The central spinal canal and neural foramina are patent at all levels. Small central disc protrusion at T7-8 is noted. MRI LUMBAR SPINE FINDINGS Segmentation:  Standard. Alignment: Trace retrolisthesis L2 on L3 is identified. There is convex left scoliosis. Vertebrae: No fracture or worrisome lesion. No evidence of infectious process. Conus medullaris and cauda equina: Conus extends to the L1-2 level. Conus and cauda equina appear normal. Paraspinal and other soft tissues: Negative. Disc levels: L1-2: Minimal disc bulge without stenosis. L2-3: Shallow disc bulge is identified. Loss of disc space height is noted. The central canal is mildly narrowed. The foramina are open. L3-4: There is loss of disc space height, a shallow disc bulge and vacuum disc phenomenon. The central canal and foramina remain open. L4-5: Bilateral facet degenerative change with associated small effusions. Shallow disc bulge. The central canal and foramina are open.  L5-S1: Shallow disc bulge without stenosis. IMPRESSION: MR THORACIC SPINE IMPRESSION No acute abnormality.  Negative for source of infection. 0.7 cm nodular opacity in the right lung cannot be definitively characterized. Recommend CT chest in 6 months to ensure stability. MR LUMBAR SPINE IMPRESSION No acute abnormality.  Negative for source of infection. Mild central canal narrowing L2-3 due to a shallow disc bulge. Scattered facet arthropathy appearing worst at L4-5. Electronically Signed   By: Inge Rise M.D.   On: 07/17/2017 08:06   Mr Ankle Right Wo Contrast  Result Date: 07/18/2017 CLINICAL DATA:  Blood cultures positive for MRSA. Pain and swelling of both ankles. EXAM: MRI OF THE RIGHT ANKLE WITHOUT CONTRAST TECHNIQUE: Multiplanar, multisequence MR imaging of the ankle was performed. No intravenous contrast was administered. COMPARISON:  None. FINDINGS: TENDONS Peroneal: Intact. Posteromedial: Intact. Anterior: Intact. Achilles: The distal tendon is somewhat thickened with calcifications within it consistent with chronic tendinosis. Mild edema in a calcaneal spur at the tendon insertion is noted. Plantar Fascia: Intact.  Very mild edema is seen in the medial cord. LIGAMENTS Lateral: Intact. Medial: Intact. CARTILAGE Ankle Joint: Normal. Subtalar Joints/Sinus Tarsi: Normal. Bones: No marrow signal abnormality to suggest osteomyelitis. No fracture or focal lesion. Other: Subcutaneous edema is present about the ankle. IMPRESSION: Subcutaneous edema about the ankle could be due to dependent change and/or cellulitis. Negative for abscess, septic joint or osteomyelitis. Chronic Achilles tendinopathy. Mild plantar fasciitis of the medial cord. Electronically Signed   By: Inge Rise M.D.   On: 07/18/2017 12:16   Mr Ankle Left Wo Contrast  Result Date: 07/18/2017 CLINICAL DATA:  Blood cultures positive for MRSA. Pain and swelling of both ankles. EXAM: MRI OF THE LEFT ANKLE WITHOUT  CONTRAST TECHNIQUE:  Multiplanar, multisequence MR imaging of the ankle was performed. No intravenous contrast was administered. COMPARISON:  None. FINDINGS: TENDONS Peroneal: Intact. Posteromedial: Intact. Anterior: Intact. Achilles: Intact. The distal tendon is thickened with calcifications within it. Mild marrow edema is seen in a calcaneal spur at the Achilles tendon insertion. Plantar Fascia: Unremarkable. LIGAMENTS Lateral: Intact. Medial: Intact. CARTILAGE Ankle Joint: Normal. Subtalar Joints/Sinus Tarsi: Unremarkable. Bones: Normal marrow signal throughout. No evidence of osteomyelitis. Other: Subcutaneous edema is present about the ankle. No focal fluid collection. IMPRESSION: Subcutaneous edema about the ankle could be due to dependent change or cellulitis. Negative for osteomyelitis, abscess or septic joint. Chronic Achilles tendinopathy. Electronically Signed   By: Inge Rise M.D.   On: 07/18/2017 12:38   Dg Chest Port 1 View  Result Date: 07/23/2017 CLINICAL DATA:  Cough. EXAM: PORTABLE CHEST 1 VIEW COMPARISON:  Chest x-ray dated June 13, 2017. FINDINGS: The heart size and mediastinal contours are within normal limits. Normal pulmonary vascularity. Resolved consolidation in the lingula with residual scarring/atelectasis. No focal consolidation, pleural effusion, or pneumothorax. No acute osseous abnormality. IMPRESSION: No active disease. Electronically Signed   By: Titus Dubin M.D.   On: 07/13/2017 13:11   Dg Chest Port 1v Same Day  Result Date: 08/03/2017 CLINICAL DATA:  MRSA bacteremia and septic shock.  Unknown source. EXAM: PORTABLE CHEST 1 VIEW COMPARISON:  07/11/2017 CXR FINDINGS: Heart and mediastinal contours are stable with slightly uncoiled appearance of the nonaneurysmal thoracic aorta. Slight increase in left basilar atelectasis since prior. No alveolar consolidation or effusion. No pneumothorax. No acute osseous abnormality. IMPRESSION: Slight increase in left basilar atelectasis. Otherwise, no  significant change is identified. Electronically Signed   By: Ashley Royalty M.D.   On: 07/13/2017 20:09    Time Spent in minutes  30 min   Velvet Bathe M.D on 08/07/17 at 10:30 AM  Between 7am to 7pm - Pager - [phone]  After 7pm go to [email or web address] - password [facility]  [facility] -  Office  [phone]

## 2017-08-11 NOTE — Consult Note (Addendum)
NEUROLOGY CONSULT  Reason for Consult: AMS Referring Physician: Dr Linward Foster  CC: pt is unable to give c/o  HPI: Theresa Morrow is an 62 y.o. 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 The pt was in her usual state of health (mRS 0) until 4/30, when she began having lower back pain, this progressed to both LE pain and swelling, what appeared to be cellulitis. She went to the ER twice for these complaints and was sent home. By Sunday 5/5 she was in septic shock with hypotension, AMS and hallucinations. She was started on abx, Cefepime in particular was started on 5/6. ID has been consulted. Her WBC remains quite elevated at 20.6, although dropping. Additional infections at this time are UTI, PNA and S. Aureus bacteremia. TEE shows no vegetation or growth, good EF 55%.   Further work up: LE [place] was neg for DVTs. MRI done of Bilat LEs, Thoracic/Lumbar Spine imaging is neg for abscess or osteomylitis or any joint sources of infections. Due to her brain tumor history MRI brain was also done upon admit and showed no acute changes. This was not done with contrast. At the time of my exam she was quickly deteriorating and unable to protect her airway. She was emergently intubated using RSI drugs, therefore neuro exam refects this.  Past Medical History Past Medical History:  Diagnosis Date  . Anemia   . Bile duct stone    liver stones  . Cancer [facility])    brain tumor 2007  . Cholangitis   . Choledocholithiasis   . Chronic headaches   . Complication of anesthesia    low heart rate  . GERD (gastroesophageal reflux disease)   . Hepatomegaly   . Kidney stones   . Obesity   . Osteoarthritis     Past Surgical History Past Surgical History:  Procedure Laterality Date  . ACOUSTIC NEUROMA RESECTION  2007  . CHOLECYSTECTOMY OPEN  1977  . ESOPHAGOGASTRODUODENOSCOPY N/A 08/15/2013   Procedure: ESOPHAGOGASTRODUODENOSCOPY  (EGD);  Surgeon: Lafayette Dragon, MD;  Location: [facility];  Service: Endoscopy;  Laterality: N/A;  . KNEE ARTHROSCOPY Left 2004  . ROUX-EN-Y GASTRIC BYPASS  2011   and removed liver stones    Family History Family History  Problem Relation Age of Onset  . Heart disease Mother   . Gallbladder disease Mother   . Diabetes Mother   . Gallbladder disease Sister   . Gallbladder disease Maternal Grandmother   . Kidney cancer Father   . Colon cancer Neg Hx     Social History    reports that she has never smoked. She has never used smokeless tobacco. She reports that she does not drink alcohol or use drugs.  Allergies No Known Allergies  Home Medications Facility-Administered Medications Prior to Admission  Medication Dose Route Frequency Provider Last Rate Last Dose  . 0.9 %  sodium chloride infusion  500 mL Intravenous Continuous Nandigam, Venia Minks, MD       Medications Prior to Admission  Medication Sig Dispense Refill  . aspirin-acetaminophen-caffeine (EXCEDRIN MIGRAINE) 250-250-65 MG tablet Take 2 tablets by mouth every 6 (six) hours as needed for headache.    . calcium carbonate (TUMS - DOSED IN MG ELEMENTAL CALCIUM) 500 MG chewable tablet Chew 1 tablet by mouth as needed for indigestion or heartburn.    . cephALEXin (KEFLEX) 500 MG capsule Take 1 capsule (500 mg total) by mouth 4 (four)  times daily. 20 capsule 0  . Cholecalciferol (VITAMIN D PO) Take 1 tablet by mouth daily.    Marland Kitchen HYDROcodone-acetaminophen (NORCO) 10-325 MG tablet Take 1 tablet by mouth every 6 (six) hours as needed for moderate pain.     Marland Kitchen ibuprofen (ADVIL,MOTRIN) 200 MG tablet Take 400 mg by mouth every 6 (six) hours as needed.    . methocarbamol (ROBAXIN) 500 MG tablet Take 1 tablet (500 mg total) by mouth 2 (two) times daily. 20 tablet 0  . Multiple Vitamin (MULTI-VITAMINS) TABS Take 1 tablet by mouth daily.    . naproxen (NAPROSYN) 375 MG tablet Take 1 tablet (375 mg total) by mouth 2 (two) times daily. 20  tablet 0  . oxyCODONE-acetaminophen (PERCOCET/ROXICET) 5-325 MG tablet Take 1 tablet by mouth every 4 (four) hours as needed for severe pain. 10 tablet 0  . pantoprazole (PROTONIX) 40 MG tablet TAKE 1 TABLET(40 MG) BY MOUTH DAILY 90 tablet 0    Hospital Medications . amiodarone  400 mg Oral BID  . aspirin  325 mg Oral Daily  . mouth rinse  15 mL Mouth Rinse BID  . metroNIDAZOLE  500 mg Oral Q8H  . pantoprazole  40 mg Oral Daily     ROS: Pt is unable to provide ROS at this time d/t encephalopathy  Physical Examination:  Vitals:   08/16/17 0540 Aug 16, 2017 0648 16-Aug-2017 0717 08/16/2017 0800  BP: 112/85 96/66 118/85   Pulse: (!) 154 (!) 139    Resp: 15 18 (!) 22   Temp:    (!) 97.5 F (36.4 C)  TempSrc:    Oral  SpO2: 97% 97% 96%   Weight:      Height:        General - obese, critically ill looking Heart - Irregegular rate and tachy rhythm, no murmer Lungs - Clear to auscultation, intubated, vented Abdomen - Soft, distended, non tender Extremities - Distal pulses intact - no edema Skin - Warm and dry. Bilat distal legs, ankles are red, swollen and hot to touch  Neurologic Examination: pt is paralyzed and sedated currently for intubation Not responsive, Pupils are fixed 67mm, not reactive, no spontanious movements seen just prior to RSI drugs and she was not responding to noxious stemuli. Currently no movements seen in setting of paralytics. Did not appreciate stiff neck prior to or during intubation.   LABORATORY STUDIES:  Basic Metabolic Panel: Recent Labs  Lab 07/14/17 1940 08/10/2017 1238  07/20/2017 2248  07/18/17 0311 07/14/2017 1211 07/20/17 0258 August 16, 2017 0338 Aug 16, 2017 1047  NA 141 138  --  138  --  138  --   --   --  136  K 3.7 4.0  --  3.8  --  4.3  --   --   --  3.8  CL 109 104  --  111  --  111  --   --   --  107  CO2 21* 18*  --  17*  --  17*  --   --   --  16*  GLUCOSE 94 72  --  148*  --  129*  --   --   --  164*  BUN 40* 63*  --  56*  --  53*  --   --   --   14  CREATININE 1.40* 2.56*   < > 1.83*   < > 1.36* 0.82 0.68 0.64 0.66  CALCIUM 8.5* 8.5*  --  7.7*  --  7.8*  --   --   --  7.7*  MG  --   --   --  1.9  --   --   --   --   --  1.7  PHOS  --   --   --  4.2  --   --   --   --   --  3.6   < > = values in this interval not displayed.    Liver Function Tests: Recent Labs  Lab 08/06/2017 1238 07/22/2017 2248 07/18/17 0311 07/31/17 1047  AST 72* 49* 30 50*  ALT 39 32 26 30  ALKPHOS 161* 129* 153* 220*  BILITOT 1.2 1.1 0.8 2.0*  PROT 6.2* 5.1* 5.4* 5.8*  ALBUMIN 2.7* 2.1* 2.1* 1.8*   Recent Labs  Lab Jul 31, 2017 1052  AMMONIA 31    CBC: Recent Labs  Lab 07/14/17 1940  07/17/2017 2248 07/17/17 0443 07/18/17 0311 07/13/2017 0418 07/20/17 0258 07-31-2017 0338  WBC 2.2*   < > 23.7* 25.2* 28.4* 14.8* 14.2* 20.6*  NEUTROABS 1.2*  --  21.8*  --   --   --   --   --   HGB 12.6   < > 11.6* 11.0* 10.8* 10.1* 10.4* 10.0*  HCT 38.9   < > 35.3* 33.7* 32.6* 30.5* 32.3* 30.8*  MCV 88.2   < > 84.7 84.7 84.7 84.7 85.9 85.6  PLT 119*   < > 129* 122* 144* 156 212 257   < > = values in this interval not displayed.    Cardiac Enzymes: Recent Labs  Lab 07/24/2017 1733 08/06/2017 2032 07/26/2017 2248 07/17/17 0443  CKTOTAL  --  79  --   --   CKMB  --  10.4*  --   --   TROPONINI <0.03  --  0.03* 0.03*    BNP: Invalid input(s): POCBNP  CBG: Recent Labs  Lab 07/17/17 1953 07/17/17 2314 07/18/17 0310 07/18/17 0817 Jul 31, 2017 1044  GLUCAP 116* 145* 112* 118* 136*   Urinalysis:  Recent Labs  Lab 07/14/17 2036 07/20/2017 1510  COLORURINE AMBER* AMBER*  LABSPEC 1.026 1.023  PHURINE 5.0 5.0  GLUCOSEU NEGATIVE NEGATIVE  HGBUR NEGATIVE MODERATE*  BILIRUBINUR NEGATIVE SMALL*  KETONESUR NEGATIVE NEGATIVE  PROTEINUR 30* 100*  NITRITE NEGATIVE NEGATIVE  LEUKOCYTESUR SMALL* TRACE*   Urine Drug Screen:      Component Value Date/Time   LABOPIA POSITIVE (A) 07/18/2017 1951   COCAINSCRNUR NONE DETECTED 07/24/2017 1951   LABBENZ NONE DETECTED  07/12/2017 1951   AMPHETMU POSITIVE (A) 07/31/2017 1951   THCU NONE DETECTED 07/25/2017 1951   LABBARB NONE DETECTED 08/06/2017 1951     EKG  EKG shows Afib RVR  I have reviewed past brain imaging inc MRI brain in 2016 and upon admit. All additional imaginag done this admission also reviewed   Dg Chest Port 1v Same Day  Result Date: 07/20/2017 CLINICAL DATA:  MRSA bacteremia and septic shock.  Unknown source. EXAM: PORTABLE CHEST 1 VIEW COMPARISON:  07/23/2017 CXR FINDINGS: Heart and mediastinal contours are stable with slightly uncoiled appearance of the nonaneurysmal thoracic aorta. Slight increase in left basilar atelectasis since prior. No alveolar consolidation or effusion. No pneumothorax. No acute osseous abnormality. IMPRESSION: Slight increase in left basilar atelectasis. Otherwise, no significant change is identified. Electronically Signed   By: Ashley Royalty M.D.   On: 08/10/2017 20:09     Assessment/Plan: critically ill appearing 62yr old with sepsis and AMS  # Encephalopathy- most likely toxic/metabolic in setting of sepsis. Will get MRI brain w/wo to r/o septic  emboli, abscess, cerebritis or meningitis. Will need LP as well. EEG to r/o status, seizures or toxic affect of cefepime  # Septic shock with hypotension, multiple infectious sources including S. Aureus Bacteremia, unclear what the initial source of infection is. TEE neg for vegetations # Febrile with leukocytosis- exact source seems unclear. She has PNA and UTI w/pneumococcal +ag as well as severe LE cellulitis. Abx per ID,  # Afib w/RVR- currently on diltiazem and heparin gtt. Would use caution in anticoagulation if there is any sign of septic emboli or brain infection.  # ARI- improving; trending labs. Even as labs improve, would expect encephalopath to lag behind.   PLAN/RECS: Stat EEG Stat MRI brain w/wo Will consider LP after the MRI, d/t body habitus she will likely need this done under fluro Seen with RN, Dr  Pati Gallo at bedside. I have updated family members at the bedside who are active in goals of care and in agreement w/above plan.  I have updated and d/w Dr Cheral Marker, attending neurologist 67min cc time spent initially: 1215-1300  Emergently called by RN in MRI to view imaging. 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 Upon re-examination (sevral hrs after RSI drugs now) She has fixed 41mm midline pupils, no OCR, no blink, no gag. GCS 3T. There is no spontaneous breathing over vent nor any movements seen. She is flaccid throughout. Heparin gtt was stopped right away.  I called a stat consult to [facility], spoke with Dr Ronnald Ramp, reviewed imaging with him, we then went for a stat [facility] to better visualize for possible surgical intervention. I reviewed these images at bedside, there was confirmation of mass effect and herniation. Exam remained same during this time. BP was stable while in CT, HR tachy. Mannitol was planned upon return to ICU.   DEATH SUMMARY: Upon return to the ICU, pt BP began to drop. Map ~20. Dilt gtt was stopped. Propofol was stopped. I quickly discussed all findings with the family at bedside while awaiting the [place] team to arrive. They confirmed that they wanted all aggressive measures possible at that moment. Upon my re-check of pt she had no pulse and was in PEA arrest, CODE BLUE called. CPR was done for ~2-4 min while code team arrived. Meanwhile Dr Ronnald Ramp, [place] arrived and spoke to family in hallway. They decided to stop the code and transition to [place] at this time as it seemed that this was not going to be a survivable event. There was a brief ROSC w/CPR only. DNR orders placed and Palliative care meds placed in case pt continued with pulse. There was still no spontaneous respirations. She was pronounced dead at 1650. I have updated attending neurologist, Dr Cheral Marker  on the above situation.   Additional cc time spent 65 min: 1545-1650 TOTAL time today= 192min cc time  Neurology attending was unable to see the patient prior to her passing away after family elected withdrawal of care. No charge.  Electronically signed: Dr. Kerney Elbe

## 2017-08-11 NOTE — Progress Notes (Signed)
Received notification from radiology regarding abnormal MRI of head.  After learning patient had intraparenchymal bleed I placed order to discontinue heparin.  Subsequently contacted the critical care specialist and updated him on current condition.  I have also placed a consult to neurosurgery and they are on their way to evaluate patient.  After doing all these things I reached out to the spouse to update him regarding current situation.  Velvet Bathe, MD

## 2017-08-11 NOTE — Death Summary Note (Signed)
Theresa Morrow was a 62 y.o. female admitted on 08/08/2017 with altered mental status.  Initial CT head showed changes from prior Lt mastoidectomy related to resection of Lt acoustic neuroma in 2007. She was noted to be hypotensive in ER and having visual hallucinations.  She also had A fib with RVR.  Found to have pneumonia and UTI.  Started on IV fluids, ABx.  Cardiology was consulted to assist with management of A fib.  She was started on anticoagulation with heparin.  Pneumococcal urine antigen was positive.  She was also found to have MRSA bacteremia.  ID was consulted to assist with ABx management.  She had TEE that was negative for endocarditis.  She was noted to have lower leg cellulitis.  Orthopedics consulted.  She developed altered mental status on 2017/08/08.  Neurology consulted.  She was intubated for airway protection.  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  She was to be transferred from [facility] to [facility] for neurosurgical intervention.  Prior to transfer she developed cardiac arrest with asystole.  She expired on 2017/08/08.  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  Final diagnoses: Septic shock Pneumococcal pneumonia MRSA bacteremia B/l lower leg cellulitis A fib with RVR Acute metabolic encephalopathy Compromised airway with acute hypoxic respiratory failure Acute renal failure with ATN UTI  Non obstructive nephrolithiasis Hypoglycemia History of acoustic neuroma s/p resection History of roux-en-y gastric bypass  Chesley Mires, MD [place] 08/09/2017, 9:34 PM

## 2017-08-11 NOTE — Progress Notes (Signed)
PATIENT TRANSPORTED TO MRI WITH MRI TEAM; RT, AND TWO RN; ON MONITOR AND VENT.

## 2017-08-11 NOTE — Progress Notes (Signed)
ANTICOAGULATION CONSULT NOTE - Follow Up Consult  Pharmacy Consult for Heparin Indication: atrial fibrillation  No Known Allergies  Patient Measurements: Height: 5\' 7"  (170.2 cm) Weight: 182 lb (82.6 kg) IBW/kg (Calculated) : 61.6 Heparin Dosing Weight:   Vital Signs: Temp: 99.1 F (37.3 C) (05/11 0333) Temp Source: Axillary (05/11 0333) BP: 134/77 (05/11 0000) Pulse Rate: 92 (05/11 0000)  Labs: Recent Labs    07/15/2017 0418 07/20/2017 1211 07/20/17 0258 07/20/17 1411 07/20/17 2129 08/01/17 0338  HGB 10.1*  --  10.4*  --   --  10.0*  HCT 30.5*  --  32.3*  --   --  30.8*  PLT 156  --  212  --   --  257  HEPARINUNFRC 0.43 0.45 0.21* 0.27* 0.39 0.32  CREATININE  --  0.82 0.68  --   --  0.64    Estimated Creatinine Clearance: 80.6 mL/min (by C-G formula based on SCr of 0.64 mg/dL).   Medications:  Infusions:  . sodium chloride 20 mL/hr at 07/20/17 0600  . sodium chloride Stopped (08/09/2017 2150)  . heparin 2,300 Units/hr (08/01/17 0347)  . vancomycin Stopped (07/20/17 2207)    Assessment: Patient with heparin level at goal.  No heparin issues noted.  Goal of Therapy:  Heparin level 0.3-0.7 units/ml Monitor platelets by anticoagulation protocol: Yes   Plan:  Continue heparin drip at current rate Recheck level with AM labs  Nani Skillern Crowford 08/01/17,5:13 AM

## 2017-08-11 NOTE — Progress Notes (Signed)
PHYSICIAN AT BEDSIDE; TIME OUT COMPLETED. PATIENT GIVEN AMIDATE 20 MG AND SUCCINYCLCHOLINE 100 MG PER MD ORDER. PATIENT SUCCESSFULLY  INTUBATED ETT 7.5; 25 @ LIP. NO SIGNS OF DISTRESS NOTED. OG PLACED. PCXR ORDERED.

## 2017-08-11 NOTE — Procedures (Signed)
Called to intubate patient.  Time out performed.  Preoxygenated. Bag valve mask. Pulse ox- 95% - on NRBMask.  meds- etomidate- 20 mg ivp Succinyl choline 100mg  ivp  7.5 ETT  DL 3  Vocal cords visualized, ETT passed under direct visualization; easy intubation.  capnometry->yellow Breath sounds bilateral equal.  Connected to ventilator.  cxr->  ETT above carina>>good position; no PTX.  No complications.     Evans Lance, MD Pulmonary and [facility] Pager: [phone]

## 2017-08-11 NOTE — Progress Notes (Signed)
At 1650 patient asystole. 2 RN's (SaraBeth Cyera Balboni, RN and Ronnie Doss, RN) listened for 2 minutes each. No heart tones were detected, no respirations were detected. Metzger, NP was notified at bedside and listened for 2 additional minutes with no heart tones and no respirations detected. Family notified at bedside. Family to notify bed placement when a funeral home has been selected, the number was provided for the husband. Post mortem checklist completed and the patient was delivered to the morgue.

## 2017-08-11 DEATH — deceased
